# Patient Record
Sex: Female | Born: 1960
Health system: Southern US, Community
[De-identification: ages and names within clinical notes are randomized; demographics above are authoritative.]

## PROBLEM LIST (undated history)

## (undated) DIAGNOSIS — R011 Cardiac murmur, unspecified: Secondary | ICD-10-CM

## (undated) DIAGNOSIS — M1990 Unspecified osteoarthritis, unspecified site: Secondary | ICD-10-CM

## (undated) DIAGNOSIS — E559 Vitamin D deficiency, unspecified: Secondary | ICD-10-CM

## (undated) DIAGNOSIS — F329 Major depressive disorder, single episode, unspecified: Secondary | ICD-10-CM

## (undated) DIAGNOSIS — G43009 Migraine without aura, not intractable, without status migrainosus: Secondary | ICD-10-CM

## (undated) DIAGNOSIS — R32 Unspecified urinary incontinence: Secondary | ICD-10-CM

## (undated) DIAGNOSIS — E119 Type 2 diabetes mellitus without complications: Secondary | ICD-10-CM

## (undated) DIAGNOSIS — G8929 Other chronic pain: Secondary | ICD-10-CM

## (undated) DIAGNOSIS — I10 Essential (primary) hypertension: Secondary | ICD-10-CM

## (undated) DIAGNOSIS — I2510 Atherosclerotic heart disease of native coronary artery without angina pectoris: Secondary | ICD-10-CM

## (undated) DIAGNOSIS — M79606 Pain in leg, unspecified: Secondary | ICD-10-CM

## (undated) DIAGNOSIS — F32A Depression, unspecified: Secondary | ICD-10-CM

## (undated) HISTORY — DX: Morbid (severe) obesity due to excess calories: E66.01

## (undated) HISTORY — DX: Essential (primary) hypertension: I10

## (undated) HISTORY — DX: Depression, unspecified: F32.A

## (undated) HISTORY — DX: Unspecified urinary incontinence: R32

## (undated) HISTORY — PX: TOTAL VAGINAL HYSTERECTOMY: SHX2548

## (undated) HISTORY — DX: Vitamin D deficiency, unspecified: E55.9

## (undated) HISTORY — DX: Type 2 diabetes mellitus without complications: E11.9

## (undated) HISTORY — DX: Pain in leg, unspecified: M79.606

## (undated) HISTORY — DX: Migraine without aura, not intractable, without status migrainosus: G43.009

## (undated) HISTORY — DX: Unspecified osteoarthritis, unspecified site: M19.90

## (undated) HISTORY — DX: Cardiac murmur, unspecified: R01.1

## (undated) HISTORY — DX: Major depressive disorder, single episode, unspecified: F32.9

## (undated) HISTORY — DX: Other chronic pain: G89.29

## (undated) HISTORY — PX: TUBAL LIGATION: SHX77

---

## 1982-03-23 HISTORY — PX: TONSILLECTOMY: SUR1361

## 1997-07-16 ENCOUNTER — Encounter: Admission: RE | Admit: 1997-07-16 | Discharge: 1997-07-16 | Payer: Self-pay | Admitting: Family Medicine

## 1997-08-17 ENCOUNTER — Encounter: Admission: RE | Admit: 1997-08-17 | Discharge: 1997-08-17 | Payer: Self-pay | Admitting: Family Medicine

## 1997-10-03 ENCOUNTER — Encounter: Admission: RE | Admit: 1997-10-03 | Discharge: 1997-10-03 | Payer: Self-pay | Admitting: Family Medicine

## 1997-10-03 ENCOUNTER — Other Ambulatory Visit: Admission: RE | Admit: 1997-10-03 | Discharge: 1997-10-03 | Payer: Self-pay | Admitting: *Deleted

## 1998-01-22 ENCOUNTER — Encounter: Admission: RE | Admit: 1998-01-22 | Discharge: 1998-01-22 | Payer: Self-pay | Admitting: Sports Medicine

## 1998-03-17 ENCOUNTER — Ambulatory Visit: Admission: RE | Admit: 1998-03-17 | Discharge: 1998-03-17 | Payer: Self-pay | Admitting: Emergency Medicine

## 1998-03-21 ENCOUNTER — Encounter: Admission: RE | Admit: 1998-03-21 | Discharge: 1998-03-21 | Payer: Self-pay | Admitting: Family Medicine

## 1998-05-27 ENCOUNTER — Encounter: Admission: RE | Admit: 1998-05-27 | Discharge: 1998-05-27 | Payer: Self-pay | Admitting: Family Medicine

## 1998-06-24 ENCOUNTER — Encounter: Admission: RE | Admit: 1998-06-24 | Discharge: 1998-06-24 | Payer: Self-pay | Admitting: Family Medicine

## 1998-09-11 ENCOUNTER — Encounter: Admission: RE | Admit: 1998-09-11 | Discharge: 1998-09-11 | Payer: Self-pay | Admitting: Family Medicine

## 1998-09-12 ENCOUNTER — Encounter: Payer: Self-pay | Admitting: Emergency Medicine

## 1998-09-12 ENCOUNTER — Emergency Department (HOSPITAL_COMMUNITY): Admission: EM | Admit: 1998-09-12 | Discharge: 1998-09-12 | Payer: Self-pay | Admitting: Emergency Medicine

## 1998-09-27 ENCOUNTER — Ambulatory Visit (HOSPITAL_COMMUNITY): Admission: RE | Admit: 1998-09-27 | Discharge: 1998-09-27 | Payer: Self-pay | Admitting: Emergency Medicine

## 1998-12-18 ENCOUNTER — Encounter: Admission: RE | Admit: 1998-12-18 | Discharge: 1998-12-18 | Payer: Self-pay | Admitting: Family Medicine

## 2002-03-23 HISTORY — PX: KNEE ARTHROSCOPY W/ ACL RECONSTRUCTION: SHX1858

## 2003-03-24 HISTORY — PX: LAPAROSCOPIC CHOLECYSTECTOMY: SUR755

## 2005-03-23 HISTORY — PX: LAPAROSCOPIC GASTRIC BANDING: SHX1100

## 2007-07-09 ENCOUNTER — Emergency Department (HOSPITAL_COMMUNITY): Admission: EM | Admit: 2007-07-09 | Discharge: 2007-07-09 | Payer: Self-pay | Admitting: Emergency Medicine

## 2007-10-27 ENCOUNTER — Emergency Department (HOSPITAL_COMMUNITY): Admission: EM | Admit: 2007-10-27 | Discharge: 2007-10-27 | Payer: Self-pay | Admitting: Emergency Medicine

## 2008-01-16 ENCOUNTER — Ambulatory Visit (HOSPITAL_COMMUNITY): Admission: RE | Admit: 2008-01-16 | Discharge: 2008-01-16 | Payer: Self-pay | Admitting: Internal Medicine

## 2008-01-30 ENCOUNTER — Encounter: Admission: RE | Admit: 2008-01-30 | Discharge: 2008-01-30 | Payer: Self-pay | Admitting: Internal Medicine

## 2008-05-20 ENCOUNTER — Emergency Department (HOSPITAL_COMMUNITY): Admission: EM | Admit: 2008-05-20 | Discharge: 2008-05-20 | Payer: Self-pay | Admitting: Emergency Medicine

## 2008-08-18 ENCOUNTER — Emergency Department (HOSPITAL_COMMUNITY): Admission: EM | Admit: 2008-08-18 | Discharge: 2008-08-18 | Payer: Self-pay | Admitting: Emergency Medicine

## 2009-03-03 ENCOUNTER — Emergency Department (HOSPITAL_COMMUNITY): Admission: EM | Admit: 2009-03-03 | Discharge: 2009-03-03 | Payer: Self-pay | Admitting: Family Medicine

## 2009-06-10 ENCOUNTER — Ambulatory Visit (HOSPITAL_COMMUNITY): Admission: RE | Admit: 2009-06-10 | Discharge: 2009-06-10 | Payer: Self-pay | Admitting: Internal Medicine

## 2010-01-18 ENCOUNTER — Emergency Department (HOSPITAL_COMMUNITY)
Admission: EM | Admit: 2010-01-18 | Discharge: 2010-01-18 | Payer: Self-pay | Source: Home / Self Care | Admitting: Family Medicine

## 2010-01-18 ENCOUNTER — Inpatient Hospital Stay (HOSPITAL_COMMUNITY): Admission: EM | Admit: 2010-01-18 | Discharge: 2010-01-22 | Payer: Self-pay | Admitting: Emergency Medicine

## 2010-04-13 ENCOUNTER — Encounter: Payer: Self-pay | Admitting: Internal Medicine

## 2010-04-20 ENCOUNTER — Emergency Department (HOSPITAL_COMMUNITY)
Admission: EM | Admit: 2010-04-20 | Discharge: 2010-04-20 | Payer: Self-pay | Source: Home / Self Care | Admitting: Emergency Medicine

## 2010-06-04 LAB — COMPREHENSIVE METABOLIC PANEL
AST: 19 U/L (ref 0–37)
Albumin: 4 g/dL (ref 3.5–5.2)
Alkaline Phosphatase: 57 U/L (ref 39–117)
BUN: 16 mg/dL (ref 6–23)
CO2: 27 mEq/L (ref 19–32)
GFR calc Af Amer: >60 mL/min (ref >60)
GFR calc non Af Amer: >60 mL/min (ref >60)
Glucose, Bld: 87 mg/dL (ref 70–99)
Sodium: 139 mEq/L (ref 135–145)
Total Bilirubin: 0.4 mg/dL (ref 0.3–1.2)
Total Protein: 8.2 g/dL (ref 6.0–8.3)

## 2010-06-04 LAB — DIFFERENTIAL
Basophils Relative: 0 % (ref 0–1)
Eosinophils Absolute: 0 10*3/uL (ref 0.0–0.7)
Eosinophils Relative: 1 % (ref 0–5)
Lymphocytes Relative: 29 % (ref 12–46)
Lymphs Abs: 1.4 10*3/uL (ref 0.7–4.0)
Monocytes Absolute: 0.4 10*3/uL (ref 0.1–1.0)
Neutro Abs: 3 10*3/uL (ref 1.7–7.7)

## 2010-06-04 LAB — POCT URINALYSIS DIPSTICK
Bilirubin Urine: NEGATIVE
Ketones, ur: NEGATIVE mg/dL
Protein, ur: 30 mg/dL — AB
Specific Gravity, Urine: 1.015 (ref 1.005–1.030)

## 2010-06-04 LAB — CBC
MCHC: 31.6 g/dL (ref 30.0–36.0)
Platelets: 282 10*3/uL (ref 150–400)
RDW: 14.3 % (ref 11.5–15.5)

## 2010-06-04 LAB — LIPASE, BLOOD: Lipase: 21 U/L (ref 11–59)

## 2010-06-16 ENCOUNTER — Other Ambulatory Visit (HOSPITAL_COMMUNITY): Payer: Self-pay | Admitting: Internal Medicine

## 2010-06-16 DIAGNOSIS — Z1231 Encounter for screening mammogram for malignant neoplasm of breast: Secondary | ICD-10-CM

## 2010-06-24 ENCOUNTER — Ambulatory Visit (HOSPITAL_COMMUNITY)
Admission: RE | Admit: 2010-06-24 | Discharge: 2010-06-24 | Disposition: A | Discharge status: Home / Self Care | Payer: Self-pay | Source: Ambulatory Visit | Attending: Internal Medicine | Admitting: Internal Medicine

## 2010-06-24 DIAGNOSIS — Z1231 Encounter for screening mammogram for malignant neoplasm of breast: Secondary | ICD-10-CM | POA: Insufficient documentation

## 2010-08-19 ENCOUNTER — Ambulatory Visit (AMBULATORY_SURGERY_CENTER): Payer: 59 | Admitting: *Deleted

## 2010-08-19 VITALS — Ht 65.5 in | Wt 197.0 lb

## 2010-08-19 DIAGNOSIS — Z1211 Encounter for screening for malignant neoplasm of colon: Secondary | ICD-10-CM

## 2010-08-19 MED ORDER — PEG-KCL-NACL-NASULF-NA ASC-C 100 G PO SOLR: ORAL | Status: DC

## 2010-08-19 NOTE — Progress Notes (Signed)
Samantha Molina had a colon about 10 years ago at [facility].  She states it was normal.  I have given the Medical release form to [name] CMA.

## 2010-08-21 ENCOUNTER — Telehealth: Payer: Self-pay

## 2010-08-21 NOTE — Telephone Encounter (Signed)
Message copied by Donata Duff on Thu Aug 21, 2010  4:36 PM ------      Message from: Sarina Ill [facility]      Created: Tue Aug 19, 2010 11:50 AM      Regarding: Medical release form       Ms. Vear Clock says she had a colon at [facility] about 10 years ago and that it was normal.  I gave the Medical release form to [name] CMA.

## 2010-08-21 NOTE — Telephone Encounter (Signed)
Yes, she can stay on schedule.  If she has not had a colonoscopy for 10 years, then screening exam is indicated now.

## 2010-08-21 NOTE — Telephone Encounter (Signed)
Dr Christella Hartigan I have not gotten a response about the previous procedure.  I have refaxed the release with no response.  She is a direct for 08/27/10 do you want to leave her on the schedule?

## 2010-08-22 NOTE — Telephone Encounter (Signed)
Pt will remain on schedule and pt aware to keep appt

## 2010-08-27 ENCOUNTER — Ambulatory Visit (AMBULATORY_SURGERY_CENTER): Payer: 59 | Admitting: Gastroenterology

## 2010-08-27 ENCOUNTER — Encounter: Payer: Self-pay | Admitting: Gastroenterology

## 2010-08-27 VITALS — BP 125/75 | HR 63 | Temp 97.5°F | Resp 16 | Ht 66.0 in | Wt 197.0 lb

## 2010-08-27 DIAGNOSIS — Z1211 Encounter for screening for malignant neoplasm of colon: Secondary | ICD-10-CM

## 2010-08-27 DIAGNOSIS — K5730 Diverticulosis of large intestine without perforation or abscess without bleeding: Secondary | ICD-10-CM

## 2010-08-27 MED ORDER — SODIUM CHLORIDE 0.9 % IV SOLN: 500.0000 mL | INTRAVENOUS | Status: DC

## 2010-08-27 NOTE — Patient Instructions (Signed)
PT. GIVEN DISCHARGE INSTRUCTIONS ( SEE GREEN & BLUE SHEETS ).INFORMATION ON DIVERTICULOSIS & HIGH FIBER DIET GIVEN. SAFETY PRECAUTIONS FOR TODAY DUE TO  SEDATION MEDICATIONS GIVEN AND DIET RECOMMENDED FOR TODAY DISCUSSED.

## 2010-08-28 ENCOUNTER — Telehealth: Payer: Self-pay | Admitting: *Deleted

## 2010-08-28 NOTE — Telephone Encounter (Signed)

## 2010-10-23 ENCOUNTER — Ambulatory Visit (INDEPENDENT_AMBULATORY_CARE_PROVIDER_SITE_OTHER): Payer: 59

## 2010-10-23 ENCOUNTER — Inpatient Hospital Stay (INDEPENDENT_AMBULATORY_CARE_PROVIDER_SITE_OTHER)
Admission: RE | Admit: 2010-10-23 | Discharge: 2010-10-23 | Disposition: A | Discharge status: Home / Self Care | Payer: 59 | Source: Ambulatory Visit | Attending: Family Medicine | Admitting: Family Medicine

## 2010-10-23 DIAGNOSIS — M722 Plantar fascial fibromatosis: Secondary | ICD-10-CM

## 2010-10-23 DIAGNOSIS — M7660 Achilles tendinitis, unspecified leg: Secondary | ICD-10-CM

## 2010-12-19 LAB — POCT I-STAT, CHEM 8
Calcium, Ion: 1.26
HCT: 40
Sodium: 137
TCO2: 28

## 2010-12-19 LAB — CBC
HCT: 38.2
MCHC: 32.6
MCV: 76.9 — ABNORMAL LOW
RBC: 4.97
RDW: 14.2
WBC: 4.1

## 2010-12-19 LAB — POCT CARDIAC MARKERS
CKMB, poc: 1.9
Myoglobin, poc: 82.9

## 2010-12-19 LAB — DIFFERENTIAL: Basophils Absolute: 0

## 2010-12-19 LAB — D-DIMER, QUANTITATIVE: D-Dimer, Quant: <0.22

## 2011-03-24 DIAGNOSIS — E559 Vitamin D deficiency, unspecified: Secondary | ICD-10-CM

## 2011-03-24 HISTORY — DX: Vitamin D deficiency, unspecified: E55.9

## 2011-06-24 ENCOUNTER — Other Ambulatory Visit (HOSPITAL_COMMUNITY): Payer: Self-pay | Admitting: Internal Medicine

## 2011-06-24 DIAGNOSIS — Z1231 Encounter for screening mammogram for malignant neoplasm of breast: Secondary | ICD-10-CM

## 2011-07-23 ENCOUNTER — Ambulatory Visit (HOSPITAL_COMMUNITY)
Admission: RE | Admit: 2011-07-23 | Discharge: 2011-07-23 | Disposition: A | Discharge status: Home / Self Care | Payer: 59 | Source: Ambulatory Visit | Attending: Internal Medicine | Admitting: Internal Medicine

## 2011-07-23 DIAGNOSIS — Z1231 Encounter for screening mammogram for malignant neoplasm of breast: Secondary | ICD-10-CM | POA: Insufficient documentation

## 2012-07-18 ENCOUNTER — Other Ambulatory Visit: Payer: Self-pay | Admitting: Nurse Practitioner

## 2012-07-18 DIAGNOSIS — Z1231 Encounter for screening mammogram for malignant neoplasm of breast: Secondary | ICD-10-CM

## 2012-07-25 ENCOUNTER — Ambulatory Visit (HOSPITAL_COMMUNITY)
Admission: RE | Admit: 2012-07-25 | Discharge: 2012-07-25 | Disposition: A | Discharge status: Home / Self Care | Payer: 59 | Source: Ambulatory Visit | Attending: Nurse Practitioner | Admitting: Nurse Practitioner

## 2012-07-25 DIAGNOSIS — Z1231 Encounter for screening mammogram for malignant neoplasm of breast: Secondary | ICD-10-CM | POA: Insufficient documentation

## 2012-09-08 ENCOUNTER — Ambulatory Visit: Payer: Self-pay | Admitting: Nurse Practitioner

## 2012-09-13 ENCOUNTER — Encounter: Payer: Self-pay | Admitting: Nurse Practitioner

## 2012-09-13 ENCOUNTER — Ambulatory Visit (INDEPENDENT_AMBULATORY_CARE_PROVIDER_SITE_OTHER): Payer: 59 | Admitting: Nurse Practitioner

## 2012-09-13 VITALS — BP 126/70 | HR 78 | Resp 14 | Ht 63.75 in | Wt 196.4 lb

## 2012-09-13 DIAGNOSIS — Z01419 Encounter for gynecological examination (general) (routine) without abnormal findings: Secondary | ICD-10-CM

## 2012-09-13 NOTE — Progress Notes (Signed)
52 y.o. G3P2 Widowed African American Fe here for annual exam.  Got married in 7/ 2013. Will be going on a trip to celebrate 1st anniversary.  Husband is now diagnosed with renal failure and is seeing specialist at [facility]   No LMP recorded. Patient has had a hysterectomy.          Sexually active: no  The current method of family planning is status post hysterectomy.    Exercising: yes  zumba, pilates, kickboxing spin and weights.  Smoker:  no  Health Maintenance: Pap:  07/24/2009  negative MMG:  07/25/2012 normal Colonoscopy:  08/27/2010 diverticula recheck in 5 years BMD:   never TDaP:  2009 Labs: PCP maintains lab (blood) work and urine.    reports that she has never smoked. She has never used smokeless tobacco. She reports that she drinks about 0.5 ounces of alcohol per week. She reports that she does not use illicit drugs.  Past Medical History  Diagnosis Date  . Hypertension   . Arthritis   . Depression     Past Surgical History  Procedure Laterality Date  . Laparoscopic cholecystectomy    . Knee arthroscopy w/ acl reconstruction    . Laparoscopic gastric banding      Current Outpatient Prescriptions  Medication Sig Dispense Refill  . Cholecalciferol (VITAMIN D) 2000 UNITS tablet Take 2,000 Units by mouth daily.        Marland Kitchen desvenlafaxine (PRISTIQ) 50 MG 24 hr tablet Take 50 mg by mouth daily.        Marland Kitchen lisinopril-hydrochlorothiazide (PRINZIDE,ZESTORETIC) 20-25 MG per tablet Take 1 tablet by mouth daily.        . Multiple Vitamins-Minerals (MULTIVITAMIN WITH MINERALS) tablet Take 1 tablet by mouth daily.        Marland Kitchen zolpidem (AMBIEN) 10 MG tablet Take 10 mg by mouth at bedtime as needed.         Current Facility-Administered Medications  Medication Dose Route Frequency Provider Last Rate Last Dose  . 0.9 %  sodium chloride infusion  500 mL Intravenous Continuous Rachael Fee, MD        History reviewed. No pertinent family history.  ROS:  Pertinent items are noted in  HPI.  Otherwise, a comprehensive ROS was negative.  Exam:   BP 126/70  Pulse 78  Resp 14  Ht 5' 3.75" (1.619 m)  Wt 196 lb 6.4 oz (89.086 kg)  BMI 33.99 kg/m2 Height: 5' 3.75" (161.9 cm)  Ht Readings from Last 3 Encounters:  09/13/12 5' 3.75" (1.619 m)  08/27/10 5\' 6"  (1.676 m)  08/19/10 5' 5.5" (1.664 m)    General appearance: alert, cooperative and appears stated age Head: Normocephalic, without obvious abnormality, atraumatic Neck: no adenopathy, supple, symmetrical, trachea midline and thyroid normal to inspection and palpation Lungs: clear to auscultation bilaterally Breasts: normal appearance, no masses or tenderness Heart: regular rate and rhythm Abdomen: soft, non-tender; no masses,  no organomegaly Extremities: extremities normal, atraumatic, no cyanosis or edema Skin: Skin color, texture, turgor normal. No rashes or lesions Lymph nodes: Cervical, supraclavicular, and axillary nodes normal. No abnormal inguinal nodes palpated Neurologic: Grossly normal   Pelvic: External genitalia:  no lesions              Urethra:  normal appearing urethra with no masses, tenderness or lesions              Bartholin's and Skene's: normal  Vagina: normal appearing vagina with normal color and discharge, no lesions              Cervix: absent              Pap taken: no Bimanual Exam:  Uterus:  uterus absent              Adnexa: no mass, fullness, tenderness               Rectovaginal: Confirms               Anus:  normal sphincter tone, no lesions  A:  Well Woman with normal exam  S/P TVH age 73 secondary to menorrhagia  [facility] - has BRCA Info  P:   Pap smear as per guidelines   Mammogram due 5 /2015  Counseled on breast self exam, adequate intake of calcium and vitamin D,   diet and exercise, Kegel's exercises return annually or prn  An After Visit Summary was printed and given to the patient.

## 2012-09-13 NOTE — Patient Instructions (Signed)

## 2012-09-14 NOTE — Progress Notes (Signed)
Encounter reviewed by Dr. Chadric Kimberley Silva.  

## 2012-10-12 ENCOUNTER — Encounter: Payer: 59 | Attending: Family Medicine | Admitting: *Deleted

## 2012-10-12 ENCOUNTER — Encounter: Payer: Self-pay | Admitting: *Deleted

## 2012-10-12 DIAGNOSIS — Z713 Dietary counseling and surveillance: Secondary | ICD-10-CM | POA: Insufficient documentation

## 2012-10-12 NOTE — Progress Notes (Signed)
  Follow-up visit:  7+ Years Post-Operative LAGB Surgery  Medical Nutrition Therapy:  Appt start time: 1130   End time: 1230.  Primary concerns today: Post-operative Bariatric Surgery Nutrition Refresher Works 6:30p - 2:30a; sleeps from 1:30p-4:30p. Snacks on 1/4 c cashews or almonds, protein shakes, tuna or chicken salad (made with [employer]).   Surgery date: 07/2005 Patient reported pre-op weight: 289 lbs Lowest patient reported weight:  132 lbs/size 4 (2008)  Weight today: 198.0 lbs Weight change: + 68 lbs (from lowest weight) Total weight lost:  91 lbs  Goal weight: 170-175 lbs % goal met:  76-80%  TANITA  BODY COMP RESULTS  10/12/12   BMI (kg/m^2) 34.0   Fat Mass (lbs) 83.0   Fat Free Mass (lbs) 115.0   Total Body Water (lbs) 84.0   24-hr recall: Reports she resumed this eating plan in January 2014. States she had been eating whatever she wanted, though has had no bread in the last year.  B (AM): 8 oz Body by Vi or Go Lean shake = 20-25g Snk (10:30 AM): Protein bar (Power Crunch/Pure Protein) = 15-20g L (12:30 PM): Spinach salad with 2 oz grilled chicken, tomatoes, cucumbers, and avocado = 15g Snk (PM): None - sleeping D (PM): Protein shake or 2 oz lean protein/vegetables = 15-25g Snk (PM): None  Fluid intake:  Reports > 64 oz (Minute Maid "Just 15 cal", protein shakes) Estimated total protein intake: 65-85g  Medications:  Reconciled with pt during visit.  Supplementation: Taking MVI regularly. No calcium citrate reported.   Using straws: No Drinking while eating: No Hair loss: No Carbonated beverages: No N/V/D/C: None Last Lap-Band fill:  Dec 2013 - Dr. Delories Heinz [facility] [place])  Recent physical activity:  Very active in Zumba, Kickboxing, Pilates, and weight lifting  Progress Towards Goal(s):  In progress.  Handouts given during visit include:  Specialized Bariatric Surgery Post-Op Diet  Samples given during visit include:   Celebrate  Vitamins Iron + C 18 mg (2) - Lot: [id number]; Exp: 03/15   Iron 30 mg (4) - Lot: [id number]; Exp: 01/16  Orange MVI (3) - Lot: [id number]; Exp: 07/15 Mandarin Orange MVI (3) - Lot: [id number]; Exp: 07/15 Grape MVI (3) - Lot: [id number]; Exp: 01/15 Calcium (Orange Burst - 3) - Lot: [id number]; Exp: 03/16   Bariatric Advantage Vitamins MVI (3 ea) Lot: [id number]; Exp: 06/15 Lot: [id number]; Exp: 06/15 Lot: [id number]; Exp: 10/15    Calcium Citrate (3 ea) Lot: [id number]; Exp: 03/15 Lot: [id number]; Exp: 10/15  Nutritional Diagnosis:  Levan-3.4 Unintentional weight gain related to poor food choices s/p LAGB surgery as evidenced by patient with ~ 70 lb wt gain over last 5 years.    Intervention:  Nutrition education/reinforcement.  Monitoring/Evaluation:  Dietary intake, exercise, lap band fills, and body weight. Follow up in 1 month or prn.

## 2012-10-12 NOTE — Patient Instructions (Addendum)
Goals:  Eat 3-6 small meals/snacks, every 3-5 hrs  Increase lean protein foods to meet 60-80g goal  Increase fluid intake to 64oz +  Aim for a max of 15 grams of carbohydrate (fruit, whole grain, starchy vegetable) with meals  Avoid drinking 15 minutes before, during and 30 minutes after eating  Aim for >30 min of physical activity daily  Track intake in My Fitness Pal (or similar) and make me a friend

## 2012-11-09 ENCOUNTER — Ambulatory Visit: Payer: 59 | Admitting: *Deleted

## 2013-01-09 ENCOUNTER — Emergency Department (HOSPITAL_COMMUNITY)
Admission: EM | Admit: 2013-01-09 | Discharge: 2013-01-09 | Disposition: A | Discharge status: Home / Self Care | Payer: 59 | Source: Home / Self Care | Attending: Family Medicine | Admitting: Family Medicine

## 2013-01-09 ENCOUNTER — Encounter (HOSPITAL_COMMUNITY): Payer: Self-pay | Admitting: Emergency Medicine

## 2013-01-09 DIAGNOSIS — J209 Acute bronchitis, unspecified: Secondary | ICD-10-CM

## 2013-01-09 DIAGNOSIS — J208 Acute bronchitis due to other specified organisms: Secondary | ICD-10-CM

## 2013-01-09 MED ORDER — IPRATROPIUM BROMIDE 0.03 % NA SOLN: 2.0000 | Freq: Two times a day (BID) | NASAL | Status: DC

## 2013-01-09 MED ORDER — AZITHROMYCIN 250 MG PO TABS: 250.0000 mg | ORAL_TABLET | Freq: Every day | ORAL | Status: DC

## 2013-01-09 MED ORDER — PREDNISONE 50 MG PO TABS: 50.0000 mg | ORAL_TABLET | Freq: Every day | ORAL | Status: DC

## 2013-01-09 MED ORDER — GUAIFENESIN-CODEINE 100-10 MG/5ML PO SOLN: 5.0000 mL | Freq: Three times a day (TID) | ORAL | Status: DC | PRN

## 2013-01-09 NOTE — ED Notes (Signed)
C/o productive cough with yellow/green sputum. Body aches. Nausea and sneezing since Thursday night. Denies fever and diarrhea.   States recently had flu vaccine. Pt has used Thera flu with no relief in symptoms.

## 2013-01-09 NOTE — ED Provider Notes (Addendum)
Samantha Molina is a 52 y.o. female who presents to Urgent Care today for productive cough associated with sneezing and body aches over the past 4 days. No trouble breathing or chest pain. She's tried some over-the-counter medications which have not helped much. No fevers chills nausea vomiting or diarrhea. She is well otherwise.   Past Medical History  Diagnosis Date  . Hypertension   . Arthritis   . Depression   . Vitamin D deficiency disease 2013  . Morbid obesity   . Diabetes mellitus without complication    History  Substance Use Topics  . Smoking status: Never Smoker   . Smokeless tobacco: Never Used  . Alcohol Use: 0.5 oz/week    1 drink(s) per week     Comment: rarely   ROS as above Medications reviewed. No current facility-administered medications for this encounter.   Current Outpatient Prescriptions  Medication Sig Dispense Refill  . desvenlafaxine (PRISTIQ) 50 MG 24 hr tablet Take 50 mg by mouth daily.        Marland Kitchen lisinopril-hydrochlorothiazide (PRINZIDE,ZESTORETIC) 20-25 MG per tablet Take 1 tablet by mouth daily.        Marland Kitchen azithromycin (ZITHROMAX) 250 MG tablet Take 1 tablet (250 mg total) by mouth daily. Take first 2 tablets together, then 1 every day until finished.  6 tablet  0  . Cholecalciferol (VITAMIN D) 2000 UNITS tablet Take 2,000 Units by mouth daily.        Marland Kitchen guaiFENesin-codeine 100-10 MG/5ML syrup Take 5 mLs by mouth 3 (three) times daily as needed for cough.  120 mL  0  . ipratropium (ATROVENT) 0.03 % nasal spray Place 2 sprays into the nose every 12 (twelve) hours.  30 mL  1  . Multiple Vitamins-Minerals (MULTIVITAMIN WITH MINERALS) tablet Take 1 tablet by mouth daily.        Marland Kitchen zolpidem (AMBIEN) 10 MG tablet Take 10 mg by mouth at bedtime as needed.          Exam:  BP 161/93  Pulse 66  Temp(Src) 98.6 F (37 C) (Oral)  Resp 14  SpO2 100% Gen: Well NAD HEENT: EOMI,  MMM, posterior pharynx with cobblestoning. Tympanic membranes are normal appearing  bilateral Lungs: CTABL Nl WOB Heart: RRR no MRG Abd: NABS, NT, ND Exts: Non edematous BL  LE, warm and well perfused.   No results found for this or any previous visit (from the past 24 hour(s)). No results found.  Assessment and Plan: 52 y.o. female with bronchitis likely viral.. This is also associated with a postnasal drip. Very low probability for pneumonia given normal oxygen sat heart rate and temperature as well as normal lung exam. Plan to treat with codeine containing cough medication, 5 days prednisone course, Atrovent nasal spray, and Tylenol or ibuprofen. Additionally we'll use azithromycin if not getting better in a few days. Discussed warning signs or symptoms. Please see discharge instructions. Patient expresses understanding.      Rodolph Bong, MD 01/09/13 1145  Rodolph Bong, MD 01/09/13 [id number]

## 2013-06-22 ENCOUNTER — Encounter: Payer: Self-pay | Admitting: Nurse Practitioner

## 2013-07-26 ENCOUNTER — Other Ambulatory Visit (HOSPITAL_COMMUNITY): Payer: Self-pay | Admitting: Family Medicine

## 2013-07-26 DIAGNOSIS — Z1231 Encounter for screening mammogram for malignant neoplasm of breast: Secondary | ICD-10-CM

## 2013-07-31 ENCOUNTER — Ambulatory Visit (HOSPITAL_COMMUNITY)
Admission: RE | Admit: 2013-07-31 | Discharge: 2013-07-31 | Disposition: A | Discharge status: Home / Self Care | Payer: 59 | Source: Ambulatory Visit | Attending: Family Medicine | Admitting: Family Medicine

## 2013-07-31 DIAGNOSIS — Z1231 Encounter for screening mammogram for malignant neoplasm of breast: Secondary | ICD-10-CM | POA: Insufficient documentation

## 2013-09-15 ENCOUNTER — Ambulatory Visit: Payer: 59 | Admitting: Nurse Practitioner

## 2013-09-21 ENCOUNTER — Encounter (INDEPENDENT_AMBULATORY_CARE_PROVIDER_SITE_OTHER): Payer: Self-pay | Admitting: Surgery

## 2013-09-21 ENCOUNTER — Encounter: Payer: Self-pay | Admitting: Nurse Practitioner

## 2013-09-21 ENCOUNTER — Ambulatory Visit: Payer: 59 | Admitting: Nurse Practitioner

## 2013-09-21 ENCOUNTER — Ambulatory Visit (INDEPENDENT_AMBULATORY_CARE_PROVIDER_SITE_OTHER): Payer: Commercial Managed Care - PPO | Admitting: Surgery

## 2013-09-21 DIAGNOSIS — Z9884 Bariatric surgery status: Secondary | ICD-10-CM | POA: Insufficient documentation

## 2013-09-21 NOTE — Patient Instructions (Signed)
Sleeve Gastrectomy A sleeve gastrectomy is a surgery in which a large portion of the stomach is removed. After the surgery, the stomach will be a narrow tube about the size of a banana. This surgery is performed to help a person lose weight. The person loses weight because the reduced size of the stomach restricts the amount of food that the person can eat. The stomach will hold much less food than before the surgery. Also, the part of the stomach that is removed produces a hormone that causes hunger.  This surgery is done for people who have morbid obesity, defined as a body mass index (BMI) greater than 40. BMI is an estimate of body fat and is calculated from the height and weight of a person. This surgery may also be done for people with a BMI between 35 and 40 if they have other diseases, such as type 2 diabetes mellitus, obstructive sleep apnea, or heart and lung disorders (cardiopulmonary diseases).  LET YOUR HEALTH CARE PROVIDER KNOW ABOUT:  Any allergies you have.   All medicines you are taking, including vitamins, herbs, eyedrops, creams, and over-the-counter medicines.   Use of steroids (by mouth or creams).   Previous problems you or members of your family have had with the use of anesthetics.   Any blood disorders you have.   Previous surgeries you have had.   Possibility of pregnancy, if this applies.   Other health problems you have. RISKS AND COMPLICATIONS Generally, sleeve gastrectomy is a safe procedure. However, as with any procedure, complications can occur. Possible complications include:  Infection.  Bleeding.  Blood clots.  Damage to other organs or tissue.  Leakage of fluid from the stomach into the abdominal cavity (rare). BEFORE THE PROCEDURE  You may need to have blood tests and imaging tests (such as X-rays or ultrasonography) done before the day of surgery. A test to evaluate your esophagus and how it moves (esophageal manometry) may also be  done.  You may be placed on a liquid diet 2-3 weeks before the surgery.  Ask your health care provider about changing or stopping your regular medicines.  Do not eat or drink anything for at least 8 hours before the procedure.   Make plans to have someone drive you home after your hospital stay. Also arrange for someone to help you with activities during recovery. PROCEDURE  A laparoscopic technique is usually used for this surgery:  You will be given medicine to make you sleep through the procedure (general anesthetic). This medicine will be given through an intravenous (IV) access tube that is put into one of your veins.  Once you are asleep, your abdomen will be cleaned and sterilized.  Several small incisions will be made in your abdomen.  Your abdomen will be filled with air so that it expands. This gives the surgeon more room to operate and makes your organs easier to see.  A thin, lighted tube with a tiny camera on the end (laparoscope) is put through a small incision in your abdomen. The camera on the laparoscope sends a picture to a TV screen in the operating room. This gives the surgeon a good view inside the abdomen.  Hollow tubes are put through the other small incisions in your abdomen. The tools needed for the procedure are put through these tubes.  The surgeon uses staples to divide part of the stomach and then removes it through one of the incisions.  The remaining stomach may be reinforced using stitches   or surgical glue or both to prevent leakage of the stomach contents. A small tube (drain) may be placed through one of the incisions to allow extra fluid to flow from the area.  The incisions are closed with stitches, staples, or glue. AFTER THE PROCEDURE  You will be monitored closely in a recovery area. Once the anesthetic has worn off, you will likely be moved to a regular hospital room.  You will be given medicine for pain and nausea.   You may have a drain  from one of the incisions in your abdomen. If a drain is used, it may stay in place after you go home from the hospital and be removed at a follow-up appointment.   You will be encouraged to walk around several times a day. This helps prevent blood clots.  You will be started on a liquid diet the first day after your surgery. Sometimes a test is done to check for leaking before you can eat.  You will be urged to cough and do deep breathing exercises. This helps prevent a lung infection after a surgery.  You will likely need to stay in the hospital for a few days.  Document Released: 01/04/2009 Document Revised: 11/09/2012 Document Reviewed: 07/22/2012 ExitCare Patient Information 2015 [employer]. This information is not intended to replace advice given to you by your health care provider. Make sure you discuss any questions you have with your health care provider.  

## 2013-09-21 NOTE — Addendum Note (Signed)
Addended by: Brennan BaileyBROOKS, Amelio Brosky on: 09/21/2013 12:26 PM   Modules accepted: Orders

## 2013-09-21 NOTE — Progress Notes (Signed)
Chief Complaint:  Morbid obesity BMI 35 with lap band in place  History of Present Illness:  Samantha Molina is an 53 y.o. female who underwent laparoscopic adjustable gastric banding by Dr. Delories Heinzobert Richard in [place] in 2007. She apparently had an APL band placed. Prior to surgery she began her turning around 300 pounds and lost down around to 60 before her lap band placement. She was seen in the hospital by [facility] he took the fluid out of her band for some issues with obstruction. She is attended a seminar and is in to rest in having her band removed and a gastric sleeve  performed. She has been to a seminar and wants to proceed with removal and sleeve which I stated may require two operations.    [facility] dist[facility] distal to her band and had 9 cc of fluid removed and never replaced.  UGI, CT scan all showed the band to be in good position and no complications.   She works over on [employer] at [employer].    Past Medical History  Diagnosis Date  . Hypertension   . Arthritis   . Depression   . Vitamin D deficiency disease 2013  . Morbid obesity   . Diabetes mellitus without complication     Past Surgical History  Procedure Laterality Date  . Laparoscopic cholecystectomy  2005  . Knee arthroscopy w/ acl reconstruction Left 2004  . Laparoscopic gastric banding  2007  . Total vaginal hysterectomy  age 53    Ovaries remain, for menorrhagia  . Tonsillectomy  1984    Age 53    Current Outpatient Prescriptions  Medication Sig Dispense Refill  . ALPRAZolam (XANAX) 0.25 MG tablet Take 0.25 mg by mouth at bedtime as needed for anxiety.      . Cholecalciferol (VITAMIN D) 2000 UNITS tablet Take 2,000 Units by mouth daily.        Marland Kitchen. desvenlafaxine (PRISTIQ) 50 MG 24 hr tablet Take 50 mg by mouth daily.        Marland Kitchen. lisinopril-hydrochlorothiazide (PRINZIDE,ZESTORETIC) 20-25 MG per tablet Take 1 tablet by mouth daily.        . Multiple Vitamins-Minerals  (MULTIVITAMIN WITH MINERALS) tablet Take 1 tablet by mouth daily.        . naproxen sodium (ANAPROX) 550 MG tablet Take 550 mg by mouth 2 (two) times daily with a meal.      . Omega-3 Fatty Acids (FISH OIL) 1000 MG CAPS Take by mouth.      . zolpidem (AMBIEN) 10 MG tablet Take 10 mg by mouth at bedtime as needed.         No current facility-administered medications for this visit.   Review of patient's allergies indicates no known allergies. Family History  Problem Relation Age of Onset  . Hypertension Mother   . Rheum arthritis Mother     Lupus  . Diabetes Father   . Hypertension Father   . Stroke Father   . Hyperlipidemia Father   . Diabetes Brother   . Hyperlipidemia Brother   . Breast cancer Maternal Aunt 64    X 2, one in her 5250's  . Breast cancer Cousin [id number]    maternal & paternal cousin  . Breast cancer Paternal Aunt 30    died in her 1540's   Social History:   reports that she has never smoked. She has never used smokeless tobacco. She reports that she drinks about .  5 ounces of alcohol per week. She reports that she does not use illicit drugs.   REVIEW OF SYSTEMS : Positive for prior lap chole ; otherwise negative  Physical Exam:   Blood pressure 126/80, pulse 90, temperature 97.5 F (36.4 C), height 5\' 6"  (1.676 m), weight 218 lb (98.884 kg). Body mass index is 35.2 kg/(m^2).  Gen:  WDWN AAF NAD  Neurological: Alert and oriented to person, place, and time. Motor and sensory function is grossly intact  Head: Normocephalic and atraumatic.  Eyes: Conjunctivae are normal. Pupils are equal, round, and reactive to light. No scleral icterus.  Neck: Normal range of motion. Neck supple. No tracheal deviation or thyromegaly present.  Cardiovascular:  SR without murmurs or gallops.  No carotid bruits Breast:  Not examined Respiratory: Effort normal.  No respiratory distress. No chest wall tenderness. Breath sounds normal.  No wheezes, rales or rhonchi.  Abdomen:  Moderately  obest GU:  Not examined Musculoskeletal: Normal range of motion. Extremities are nontender. No cyanosis, edema or clubbing noted Lymphadenopathy: No cervical, preauricular, postauricular or axillary adenopathy is present Skin: Skin is warm and dry. No rash noted. No diaphoresis. No erythema. No pallor. Pscyh: Normal mood and affect. Behavior is normal. Judgment and thought content normal.   LABORATORY RESULTS: No results found for this or any previous visit (from the past 48 hour(s)).   RADIOLOGY RESULTS: No results found.  Problem List: There are no active problems to display for this patient.   Assessment & Plan: Lapband and persistant obesity.  Plan removal of lapband and sleeve gastrectomy    Matt B. Daphine DeutscherMartin, MD, [facility]  [facility] [phone] beeper [phone]  09/21/2013 12:09 PM

## 2013-10-23 ENCOUNTER — Encounter (INDEPENDENT_AMBULATORY_CARE_PROVIDER_SITE_OTHER): Payer: Self-pay

## 2013-10-23 ENCOUNTER — Other Ambulatory Visit: Payer: Self-pay

## 2013-10-23 ENCOUNTER — Ambulatory Visit (HOSPITAL_COMMUNITY)
Admission: RE | Admit: 2013-10-23 | Discharge: 2013-10-23 | Disposition: A | Discharge status: Home / Self Care | Payer: 59 | Source: Ambulatory Visit | Attending: Surgery | Admitting: Surgery

## 2013-10-23 DIAGNOSIS — F3289 Other specified depressive episodes: Secondary | ICD-10-CM | POA: Insufficient documentation

## 2013-10-23 DIAGNOSIS — E119 Type 2 diabetes mellitus without complications: Secondary | ICD-10-CM | POA: Insufficient documentation

## 2013-10-23 DIAGNOSIS — Z9884 Bariatric surgery status: Secondary | ICD-10-CM | POA: Insufficient documentation

## 2013-10-23 DIAGNOSIS — F329 Major depressive disorder, single episode, unspecified: Secondary | ICD-10-CM | POA: Insufficient documentation

## 2013-10-23 DIAGNOSIS — I10 Essential (primary) hypertension: Secondary | ICD-10-CM | POA: Insufficient documentation

## 2013-10-23 DIAGNOSIS — Z6835 Body mass index (BMI) 35.0-35.9, adult: Secondary | ICD-10-CM | POA: Insufficient documentation

## 2013-10-23 DIAGNOSIS — E559 Vitamin D deficiency, unspecified: Secondary | ICD-10-CM | POA: Insufficient documentation

## 2013-10-23 DIAGNOSIS — M129 Arthropathy, unspecified: Secondary | ICD-10-CM | POA: Insufficient documentation

## 2013-11-01 ENCOUNTER — Ambulatory Visit: Payer: 59 | Admitting: Dietician

## 2013-11-07 ENCOUNTER — Ambulatory Visit: Payer: 59 | Admitting: Nurse Practitioner

## 2014-01-05 ENCOUNTER — Other Ambulatory Visit: Payer: Self-pay

## 2014-01-22 ENCOUNTER — Encounter (INDEPENDENT_AMBULATORY_CARE_PROVIDER_SITE_OTHER): Payer: Self-pay | Admitting: Surgery

## 2014-02-28 ENCOUNTER — Encounter: Payer: Self-pay | Admitting: Nurse Practitioner

## 2014-07-09 ENCOUNTER — Other Ambulatory Visit (HOSPITAL_COMMUNITY): Payer: Self-pay | Admitting: Family Medicine

## 2014-07-09 DIAGNOSIS — Z1231 Encounter for screening mammogram for malignant neoplasm of breast: Secondary | ICD-10-CM

## 2014-08-06 ENCOUNTER — Ambulatory Visit (HOSPITAL_COMMUNITY)
Admission: RE | Admit: 2014-08-06 | Discharge: 2014-08-06 | Disposition: A | Discharge status: Home / Self Care | Payer: 59 | Source: Ambulatory Visit | Attending: Family Medicine | Admitting: Family Medicine

## 2014-08-06 DIAGNOSIS — Z1231 Encounter for screening mammogram for malignant neoplasm of breast: Secondary | ICD-10-CM | POA: Insufficient documentation

## 2014-08-08 ENCOUNTER — Other Ambulatory Visit: Payer: Self-pay | Admitting: Family Medicine

## 2014-08-08 DIAGNOSIS — R928 Other abnormal and inconclusive findings on diagnostic imaging of breast: Secondary | ICD-10-CM

## 2014-08-13 ENCOUNTER — Ambulatory Visit
Admission: RE | Admit: 2014-08-13 | Discharge: 2014-08-13 | Disposition: A | Discharge status: Home / Self Care | Payer: 59 | Source: Ambulatory Visit | Attending: Family Medicine | Admitting: Family Medicine

## 2014-08-13 DIAGNOSIS — R928 Other abnormal and inconclusive findings on diagnostic imaging of breast: Secondary | ICD-10-CM

## 2015-03-28 MED FILL — TOPIRAMATE 100 MG TABLET: 100 | 30 days supply | Qty: 60 | Fill #4

## 2015-03-28 MED FILL — PRISTIQ ER 50 MG TABLET: 50 | 30 days supply | Qty: 30 | Fill #1

## 2015-03-28 MED FILL — ALPRAZolam 0.25 MG TABS: 0.25 | 30 days supply | Qty: 30 | Fill #1

## 2015-04-25 DIAGNOSIS — G43009 Migraine without aura, not intractable, without status migrainosus: Secondary | ICD-10-CM | POA: Diagnosis not present

## 2015-04-25 DIAGNOSIS — M5416 Radiculopathy, lumbar region: Secondary | ICD-10-CM | POA: Diagnosis not present

## 2015-04-25 DIAGNOSIS — G629 Polyneuropathy, unspecified: Secondary | ICD-10-CM | POA: Diagnosis not present

## 2015-04-25 DIAGNOSIS — M5412 Radiculopathy, cervical region: Secondary | ICD-10-CM | POA: Diagnosis not present

## 2015-04-25 DIAGNOSIS — G5601 Carpal tunnel syndrome, right upper limb: Secondary | ICD-10-CM | POA: Diagnosis not present

## 2015-04-29 MED FILL — TOPIRAMATE 100 MG TABLET: 100 | 30 days supply | Qty: 60 | Fill #0

## 2015-04-29 MED FILL — ALPRAZolam 0.25 MG TABS: 0.25 | 30 days supply | Qty: 30 | Fill #2

## 2015-04-29 MED FILL — AMLODIPINE BESYLATE 10 MG T: 10 | 90 days supply | Qty: 90 | Fill #0

## 2015-04-29 MED FILL — PRISTIQ ER 50 MG TABLET: 50 | 30 days supply | Qty: 30 | Fill #2

## 2015-04-29 MED FILL — ZOLPIDEM TARTRATE 10 MG TAB: 10 | 90 days supply | Qty: 90 | Fill #0

## 2015-05-06 DIAGNOSIS — I10 Essential (primary) hypertension: Secondary | ICD-10-CM | POA: Diagnosis not present

## 2015-05-06 DIAGNOSIS — B349 Viral infection, unspecified: Secondary | ICD-10-CM | POA: Diagnosis not present

## 2015-05-06 DIAGNOSIS — R51 Headache: Secondary | ICD-10-CM | POA: Diagnosis not present

## 2015-05-06 MED FILL — OSELTAMIVIR PHOS 75 MG CAP: 75 | 5 days supply | Qty: 10 | Fill #0

## 2015-05-17 MED FILL — OXYCODONE-APAP 10-325 TAB: 10-325 | 30 days supply | Qty: 90 | Fill #0

## 2015-05-24 MED FILL — LISINOPRIL-HCTZ 20-25 MG TA: 20-25 | 90 days supply | Qty: 90 | Fill #1

## 2015-05-31 MED FILL — ALPRAZolam 0.25 MG TABS: 0.25 | 30 days supply | Qty: 30 | Fill #3

## 2015-05-31 MED FILL — DESVENLAFAXINE ER 50 MG TAB: 50 | 30 days supply | Qty: 30 | Fill #3

## 2015-06-19 DIAGNOSIS — J101 Influenza due to other identified influenza virus with other respiratory manifestations: Secondary | ICD-10-CM | POA: Diagnosis not present

## 2015-06-19 MED FILL — HYDROCODONE-HOMATROPINE SYR: 5-1.5 | 6 days supply | Qty: 120 | Fill #0

## 2015-06-19 MED FILL — OSELTAMIVIR PHOS 75 MG CAP: 75 | 5 days supply | Qty: 10 | Fill #0

## 2015-06-20 MED FILL — TOPIRAMATE 100 MG TABLET: 100 | 30 days supply | Qty: 60 | Fill #1

## 2015-07-02 MED FILL — DESVENLAFAXINE ER 50 MG TAB: 50 | 30 days supply | Qty: 30 | Fill #4

## 2015-07-02 MED FILL — ALPRAZolam 0.25 MG TABS: 0.25 | 30 days supply | Qty: 30 | Fill #4

## 2015-07-25 MED FILL — TOPIRAMATE 100 MG TABLET: 100 | 30 days supply | Qty: 60 | Fill #2

## 2015-07-25 MED FILL — AMLODIPINE BESYLATE 10 MG T: 10 | 90 days supply | Qty: 90 | Fill #1

## 2015-07-29 DIAGNOSIS — H5213 Myopia, bilateral: Secondary | ICD-10-CM | POA: Diagnosis not present

## 2015-08-01 MED FILL — CHLORHEXIDINE 0.12% RINSE: 0.12 | 16 days supply | Qty: 473 | Fill #0

## 2015-08-05 MED FILL — ALPRAZolam 0.25 MG TABS: 0.25 | 30 days supply | Qty: 30 | Fill #5

## 2015-08-05 MED FILL — DESVENLAFAXINE ER 50 MG TAB: 50 | 30 days supply | Qty: 30 | Fill #5

## 2015-08-07 ENCOUNTER — Other Ambulatory Visit: Payer: Self-pay

## 2015-08-07 DIAGNOSIS — Z1231 Encounter for screening mammogram for malignant neoplasm of breast: Secondary | ICD-10-CM

## 2015-08-15 DIAGNOSIS — R200 Anesthesia of skin: Secondary | ICD-10-CM | POA: Diagnosis not present

## 2015-08-15 DIAGNOSIS — G629 Polyneuropathy, unspecified: Secondary | ICD-10-CM | POA: Diagnosis not present

## 2015-08-15 DIAGNOSIS — G5603 Carpal tunnel syndrome, bilateral upper limbs: Secondary | ICD-10-CM | POA: Diagnosis not present

## 2015-08-15 DIAGNOSIS — R531 Weakness: Secondary | ICD-10-CM | POA: Diagnosis not present

## 2015-08-15 DIAGNOSIS — M5416 Radiculopathy, lumbar region: Secondary | ICD-10-CM | POA: Diagnosis not present

## 2015-08-15 DIAGNOSIS — M5412 Radiculopathy, cervical region: Secondary | ICD-10-CM | POA: Diagnosis not present

## 2015-08-15 DIAGNOSIS — G43009 Migraine without aura, not intractable, without status migrainosus: Secondary | ICD-10-CM | POA: Diagnosis not present

## 2015-08-29 ENCOUNTER — Other Ambulatory Visit: Payer: Self-pay | Admitting: Family Medicine

## 2015-08-29 ENCOUNTER — Ambulatory Visit
Admission: RE | Admit: 2015-08-29 | Discharge: 2015-08-29 | Disposition: A | Discharge status: Home / Self Care | Payer: 59 | Source: Ambulatory Visit

## 2015-08-29 DIAGNOSIS — Z1231 Encounter for screening mammogram for malignant neoplasm of breast: Secondary | ICD-10-CM

## 2015-09-04 DIAGNOSIS — F320 Major depressive disorder, single episode, mild: Secondary | ICD-10-CM | POA: Diagnosis not present

## 2015-09-04 DIAGNOSIS — E119 Type 2 diabetes mellitus without complications: Secondary | ICD-10-CM | POA: Diagnosis not present

## 2015-09-04 DIAGNOSIS — I10 Essential (primary) hypertension: Secondary | ICD-10-CM | POA: Diagnosis not present

## 2015-09-04 DIAGNOSIS — G4700 Insomnia, unspecified: Secondary | ICD-10-CM | POA: Diagnosis not present

## 2015-09-04 MED FILL — LISINOPRIL-HCTZ 20-25 MG TA: 20-25 | 90 days supply | Qty: 90 | Fill #0

## 2015-09-04 MED FILL — ALPRAZolam 0.25 MG TABS: 0.25 | 30 days supply | Qty: 30 | Fill #0

## 2015-09-05 MED FILL — TOPIRAMATE 100 MG TABLET: 100 | 30 days supply | Qty: 60 | Fill #3

## 2015-09-05 MED FILL — DESVENLAFAXINE ER 50 MG TAB: 50 | 90 days supply | Qty: 90 | Fill #0

## 2015-09-09 MED FILL — CHLORHEXIDINE 0.12% RINSE: 0.12 | 16 days supply | Qty: 473 | Fill #1

## 2015-09-12 MED FILL — OXYCODONE-ACETAMINOPHEN 10-: 10-325 | 30 days supply | Qty: 90 | Fill #0

## 2015-09-20 ENCOUNTER — Emergency Department (HOSPITAL_COMMUNITY)
Admission: EM | Admit: 2015-09-20 | Discharge: 2015-09-20 | Disposition: A | Discharge status: Home / Self Care | Payer: 59 | Attending: Emergency Medicine | Admitting: Emergency Medicine

## 2015-09-20 ENCOUNTER — Encounter (HOSPITAL_COMMUNITY): Payer: Self-pay | Admitting: *Deleted

## 2015-09-20 DIAGNOSIS — Z79899 Other long term (current) drug therapy: Secondary | ICD-10-CM | POA: Diagnosis not present

## 2015-09-20 DIAGNOSIS — R55 Syncope and collapse: Secondary | ICD-10-CM | POA: Diagnosis not present

## 2015-09-20 DIAGNOSIS — I10 Essential (primary) hypertension: Secondary | ICD-10-CM | POA: Diagnosis not present

## 2015-09-20 DIAGNOSIS — E119 Type 2 diabetes mellitus without complications: Secondary | ICD-10-CM | POA: Diagnosis not present

## 2015-09-20 LAB — BASIC METABOLIC PANEL
ANION GAP: 9 (ref 5–15)
BUN: 22 mg/dL — ABNORMAL HIGH (ref 6–20)
CALCIUM: 9.4 mg/dL (ref 8.9–10.3)
CO2: 25 mmol/L (ref 22–32)
Chloride: 104 mmol/L (ref 101–111)
Creatinine, Ser: 1.09 mg/dL — ABNORMAL HIGH (ref 0.44–1.00)
GFR calc non Af Amer: 56 mL/min — ABNORMAL LOW (ref >60)
Glucose, Bld: 98 mg/dL (ref 65–99)
Potassium: 2.9 mmol/L — ABNORMAL LOW (ref 3.5–5.1)
Sodium: 138 mmol/L (ref 135–145)

## 2015-09-20 LAB — CBC
HCT: 39.2 % (ref 36.0–46.0)
HEMOGLOBIN: 12.3 g/dL (ref 12.0–15.0)
MCH: 23.6 pg — AB (ref 26.0–34.0)
MCHC: 31.4 g/dL (ref 30.0–36.0)
MCV: 75.2 fL — ABNORMAL LOW (ref 78.0–100.0)
Platelets: 321 10*3/uL (ref 150–400)
RBC: 5.21 MIL/uL — AB (ref 3.87–5.11)
RDW: 15 % (ref 11.5–15.5)
WBC: 5.3 10*3/uL (ref 4.0–10.5)

## 2015-09-20 LAB — URINALYSIS, ROUTINE W REFLEX MICROSCOPIC
Bilirubin Urine: NEGATIVE
Glucose, UA: NEGATIVE mg/dL
HGB URINE DIPSTICK: NEGATIVE
Ketones, ur: 15 mg/dL — AB
NITRITE: NEGATIVE
Protein, ur: NEGATIVE mg/dL
SPECIFIC GRAVITY, URINE: 1.013 (ref 1.005–1.030)
pH: 5.5 (ref 5.0–8.0)

## 2015-09-20 LAB — URINE MICROSCOPIC-ADD ON

## 2015-09-20 LAB — CBG MONITORING, ED: GLUCOSE-CAPILLARY: 82 mg/dL (ref 65–99)

## 2015-09-20 MED ORDER — POTASSIUM CHLORIDE CRYS ER 20 MEQ PO TBCR
40.0000 meq | EXTENDED_RELEASE_TABLET | Freq: Once | ORAL | Status: AC
  Administered 2015-09-20: 40 meq via ORAL
  Filled 2015-09-20: qty 2

## 2015-09-20 MED ORDER — SODIUM CHLORIDE 0.9 % IV BOLUS (SEPSIS)
1000.0000 mL | Freq: Once | INTRAVENOUS | Status: AC
  Administered 2015-09-20: 1000 mL via INTRAVENOUS

## 2015-09-20 NOTE — ED Notes (Signed)
CBG 82 

## 2015-09-20 NOTE — ED Notes (Signed)
Pt had a syncopal episode after getting overheated at a gunrange (it was hotter in the gun range than outdoors)  Pt began feeling dizzy and nauseated and fainted, she was passed out for about a minute.  No fall, she was assisted to the ground.  No CP or sob with this.  Pt had a normal 12-lead and her CBG was 110.  Pt was orthostatic for ems and got NS en route.  22g IV is in left hand.
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
## 2015-09-20 NOTE — Discharge Instructions (Signed)

## 2015-10-04 DIAGNOSIS — E876 Hypokalemia: Secondary | ICD-10-CM | POA: Diagnosis not present

## 2015-10-04 DIAGNOSIS — I10 Essential (primary) hypertension: Secondary | ICD-10-CM | POA: Diagnosis not present

## 2015-10-04 DIAGNOSIS — R55 Syncope and collapse: Secondary | ICD-10-CM | POA: Diagnosis not present

## 2015-10-11 MED FILL — ALPRAZolam 0.25 MG TABS: 0.25 | 30 days supply | Qty: 30 | Fill #1

## 2015-10-31 MED FILL — TOPIRAMATE 100 MG TABLET: 100 | 30 days supply | Qty: 60 | Fill #4

## 2015-10-31 MED FILL — AMLODIPINE BESYLATE 10 MG T: 10 | 90 days supply | Qty: 90 | Fill #0

## 2015-11-13 MED FILL — ALPRAZolam 0.25 MG TABS: 0.25 | 30 days supply | Qty: 30 | Fill #2

## 2015-11-13 MED FILL — CHLORHEXIDINE 0.12% RINSE: 0.12 | 16 days supply | Qty: 473 | Fill #2

## 2015-12-03 MED FILL — LISINOPRIL-HCTZ 20-25 MG TA: 20-25 | 90 days supply | Qty: 90 | Fill #1

## 2015-12-03 MED FILL — OXYCODONE-APAP 10-325 TAB: 10-325 | 30 days supply | Qty: 90 | Fill #0

## 2015-12-12 DIAGNOSIS — G629 Polyneuropathy, unspecified: Secondary | ICD-10-CM | POA: Diagnosis not present

## 2015-12-12 DIAGNOSIS — M5416 Radiculopathy, lumbar region: Secondary | ICD-10-CM | POA: Diagnosis not present

## 2015-12-12 DIAGNOSIS — M5412 Radiculopathy, cervical region: Secondary | ICD-10-CM | POA: Diagnosis not present

## 2015-12-12 DIAGNOSIS — G5603 Carpal tunnel syndrome, bilateral upper limbs: Secondary | ICD-10-CM | POA: Diagnosis not present

## 2015-12-12 DIAGNOSIS — G43009 Migraine without aura, not intractable, without status migrainosus: Secondary | ICD-10-CM | POA: Diagnosis not present

## 2015-12-12 MED FILL — DESVENLAFAXINE ER 50 MG TAB: 50 | 90 days supply | Qty: 90 | Fill #1

## 2015-12-12 MED FILL — TOPIRAMATE 100 MG TABLET: 100 | 30 days supply | Qty: 60 | Fill #0

## 2015-12-19 MED FILL — ALPRAZolam 0.25 MG TABS: 0.25 | 30 days supply | Qty: 30 | Fill #3

## 2016-01-02 MED FILL — OXYCODONE-APAP 10-325 TAB: 10-325 | 30 days supply | Qty: 90 | Fill #0

## 2016-01-20 MED FILL — ALPRAZolam 0.25 MG TABS: 0.25 | 30 days supply | Qty: 30 | Fill #4

## 2016-01-23 DIAGNOSIS — G5603 Carpal tunnel syndrome, bilateral upper limbs: Secondary | ICD-10-CM | POA: Diagnosis not present

## 2016-01-23 DIAGNOSIS — M542 Cervicalgia: Secondary | ICD-10-CM | POA: Diagnosis not present

## 2016-01-23 DIAGNOSIS — G43009 Migraine without aura, not intractable, without status migrainosus: Secondary | ICD-10-CM | POA: Diagnosis not present

## 2016-01-23 DIAGNOSIS — M5416 Radiculopathy, lumbar region: Secondary | ICD-10-CM | POA: Diagnosis not present

## 2016-01-23 DIAGNOSIS — G629 Polyneuropathy, unspecified: Secondary | ICD-10-CM | POA: Diagnosis not present

## 2016-01-23 MED FILL — GABAPENTIN 300 MG CAPSULE: 300 | 30 days supply | Qty: 90 | Fill #0

## 2016-01-27 MED FILL — TOPIRAMATE 100 MG TABLET: 100 | 30 days supply | Qty: 60 | Fill #1

## 2016-01-28 ENCOUNTER — Encounter (HOSPITAL_COMMUNITY): Payer: Self-pay | Admitting: *Deleted

## 2016-01-28 ENCOUNTER — Emergency Department (HOSPITAL_COMMUNITY)
Admission: EM | Admit: 2016-01-28 | Discharge: 2016-01-28 | Disposition: A | Discharge status: Home / Self Care | Payer: 59 | Attending: Emergency Medicine | Admitting: Emergency Medicine

## 2016-01-28 DIAGNOSIS — S199XXA Unspecified injury of neck, initial encounter: Secondary | ICD-10-CM | POA: Insufficient documentation

## 2016-01-28 DIAGNOSIS — Z79899 Other long term (current) drug therapy: Secondary | ICD-10-CM | POA: Diagnosis not present

## 2016-01-28 DIAGNOSIS — R519 Headache, unspecified: Secondary | ICD-10-CM

## 2016-01-28 DIAGNOSIS — I10 Essential (primary) hypertension: Secondary | ICD-10-CM | POA: Insufficient documentation

## 2016-01-28 DIAGNOSIS — Y999 Unspecified external cause status: Secondary | ICD-10-CM | POA: Diagnosis not present

## 2016-01-28 DIAGNOSIS — Y939 Activity, unspecified: Secondary | ICD-10-CM | POA: Diagnosis not present

## 2016-01-28 DIAGNOSIS — M542 Cervicalgia: Secondary | ICD-10-CM

## 2016-01-28 DIAGNOSIS — R51 Headache: Secondary | ICD-10-CM | POA: Insufficient documentation

## 2016-01-28 DIAGNOSIS — Y92481 Parking lot as the place of occurrence of the external cause: Secondary | ICD-10-CM | POA: Diagnosis not present

## 2016-01-28 DIAGNOSIS — E119 Type 2 diabetes mellitus without complications: Secondary | ICD-10-CM | POA: Diagnosis not present

## 2016-01-28 MED ORDER — IBUPROFEN 400 MG PO TABS: 400.0000 mg | ORAL_TABLET | Freq: Three times a day (TID) | ORAL | 0 refills | Status: DC | PRN

## 2016-01-28 MED ORDER — IBUPROFEN 400 MG PO TABS
600.0000 mg | ORAL_TABLET | Freq: Once | ORAL | Status: AC
  Administered 2016-01-28: 600 mg via ORAL
  Filled 2016-01-28: qty 1

## 2016-01-28 MED ORDER — CYCLOBENZAPRINE HCL 10 MG PO TABS: 10.0000 mg | ORAL_TABLET | Freq: Three times a day (TID) | ORAL | 0 refills | Status: DC | PRN

## 2016-01-28 MED FILL — CYCLOBENZAPRINE 10 MG TAB: 10 | 4 days supply | Qty: 12 | Fill #0

## 2016-01-28 MED FILL — IBUPROFEN 400 MG TABLET: 400 | 5 days supply | Qty: 15 | Fill #0
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
## 2016-01-31 MED FILL — OXYCODONE-APAP 10-325 TAB: 10-325 | 30 days supply | Qty: 90 | Fill #0

## 2016-01-31 MED FILL — AMLODIPINE BESYLATE 10 MG T: 10 | 90 days supply | Qty: 90 | Fill #1

## 2016-02-20 MED FILL — ALPRAZolam 0.25 MG TABS: 0.25 | 30 days supply | Qty: 30 | Fill #5

## 2016-02-25 DIAGNOSIS — J209 Acute bronchitis, unspecified: Secondary | ICD-10-CM | POA: Diagnosis not present

## 2016-02-25 DIAGNOSIS — E119 Type 2 diabetes mellitus without complications: Secondary | ICD-10-CM | POA: Diagnosis not present

## 2016-02-25 DIAGNOSIS — G4700 Insomnia, unspecified: Secondary | ICD-10-CM | POA: Diagnosis not present

## 2016-02-25 DIAGNOSIS — I10 Essential (primary) hypertension: Secondary | ICD-10-CM | POA: Diagnosis not present

## 2016-02-25 DIAGNOSIS — F320 Major depressive disorder, single episode, mild: Secondary | ICD-10-CM | POA: Diagnosis not present

## 2016-02-25 MED FILL — LISINOPRIL-HCTZ 20-25 MG TA: 20-25 | 90 days supply | Qty: 90 | Fill #0

## 2016-02-25 MED FILL — AMOXICILLIN 875 MG TABLET: 875 | 10 days supply | Qty: 20 | Fill #0

## 2016-02-25 MED FILL — ZOLPIDEM TARTRATE 10 MG TAB: 10 | 90 days supply | Qty: 90 | Fill #0

## 2016-03-02 MED FILL — TOPIRAMATE 100 MG TABLET: 100 | 30 days supply | Qty: 60 | Fill #2

## 2016-03-05 DIAGNOSIS — M5412 Radiculopathy, cervical region: Secondary | ICD-10-CM | POA: Diagnosis not present

## 2016-03-05 DIAGNOSIS — G43009 Migraine without aura, not intractable, without status migrainosus: Secondary | ICD-10-CM | POA: Diagnosis not present

## 2016-03-05 DIAGNOSIS — M5416 Radiculopathy, lumbar region: Secondary | ICD-10-CM | POA: Diagnosis not present

## 2016-03-05 DIAGNOSIS — G5603 Carpal tunnel syndrome, bilateral upper limbs: Secondary | ICD-10-CM | POA: Diagnosis not present

## 2016-03-05 DIAGNOSIS — G629 Polyneuropathy, unspecified: Secondary | ICD-10-CM | POA: Diagnosis not present

## 2016-03-05 MED FILL — OXYCODONE-APAP 10-325: 10-325 | 30 days supply | Qty: 90 | Fill #0

## 2016-03-10 MED FILL — GABAPENTIN 300 MG CAPSULE: 300 | 30 days supply | Qty: 90 | Fill #1

## 2016-03-25 MED FILL — DESVENLAFAXINE SUC ER 50 MG: 50 | 90 days supply | Qty: 90 | Fill #0

## 2016-03-25 MED FILL — ALPRAZolam 0.25 MG TABS: 0.25 | 30 days supply | Qty: 30 | Fill #0

## 2016-04-06 MED FILL — OXYCODONE-APAP 10-325: 10-325 | 30 days supply | Qty: 90 | Fill #0

## 2016-04-28 DIAGNOSIS — B349 Viral infection, unspecified: Secondary | ICD-10-CM | POA: Diagnosis not present

## 2016-04-28 MED FILL — IPRATROPIUM 0.06% SPRAY: 0.06 | 30 days supply | Qty: 15 | Fill #0

## 2016-04-28 MED FILL — CHERATUSSIN AC SYRUP: 100-10 | 7 days supply | Qty: 70 | Fill #0

## 2016-05-04 MED FILL — TOPIRAMATE 100 MG TABLET: 100 | 30 days supply | Qty: 60 | Fill #3

## 2016-05-04 MED FILL — ALPRAZolam 0.25 MG TABS: 0.25 | 30 days supply | Qty: 30 | Fill #1

## 2016-05-04 MED FILL — AMLODIPINE BESYLATE 10 MG T: 10 | 90 days supply | Qty: 90 | Fill #0

## 2016-05-08 MED FILL — OXYCODONE-APAP 10-325: 10-325 | 30 days supply | Qty: 90 | Fill #0

## 2016-06-12 MED FILL — TOPIRAMATE 100 MG TABLET: 100 | 30 days supply | Qty: 60 | Fill #4

## 2016-06-12 MED FILL — ALPRAZolam 0.25 MG TABS: 0.25 | 30 days supply | Qty: 30 | Fill #2

## 2016-06-15 MED FILL — LISINOPRIL-HCTZ 20-25 MG TA: 20-25 | 90 days supply | Qty: 90 | Fill #1

## 2016-06-16 DIAGNOSIS — G43009 Migraine without aura, not intractable, without status migrainosus: Secondary | ICD-10-CM | POA: Diagnosis not present

## 2016-06-16 DIAGNOSIS — M5416 Radiculopathy, lumbar region: Secondary | ICD-10-CM | POA: Diagnosis not present

## 2016-06-16 DIAGNOSIS — G5603 Carpal tunnel syndrome, bilateral upper limbs: Secondary | ICD-10-CM | POA: Diagnosis not present

## 2016-06-16 DIAGNOSIS — M5412 Radiculopathy, cervical region: Secondary | ICD-10-CM | POA: Diagnosis not present

## 2016-06-16 DIAGNOSIS — G603 Idiopathic progressive neuropathy: Secondary | ICD-10-CM | POA: Diagnosis not present

## 2016-06-18 DIAGNOSIS — H5213 Myopia, bilateral: Secondary | ICD-10-CM | POA: Diagnosis not present

## 2016-06-24 MED FILL — DESVENLAFAXINE SUC ER 50 MG: 50 | 90 days supply | Qty: 90 | Fill #1

## 2016-07-06 ENCOUNTER — Emergency Department (HOSPITAL_COMMUNITY): Payer: 59

## 2016-07-06 ENCOUNTER — Inpatient Hospital Stay (HOSPITAL_COMMUNITY)
Admission: EM | Admit: 2016-07-06 | Discharge: 2016-07-09 | DRG: 872 | Disposition: A | Discharge status: Home / Self Care | Payer: 59 | Attending: Internal Medicine | Admitting: Internal Medicine

## 2016-07-06 ENCOUNTER — Encounter (HOSPITAL_COMMUNITY): Payer: Self-pay | Admitting: Emergency Medicine

## 2016-07-06 DIAGNOSIS — F419 Anxiety disorder, unspecified: Secondary | ICD-10-CM | POA: Diagnosis not present

## 2016-07-06 DIAGNOSIS — Z8249 Family history of ischemic heart disease and other diseases of the circulatory system: Secondary | ICD-10-CM

## 2016-07-06 DIAGNOSIS — Z833 Family history of diabetes mellitus: Secondary | ICD-10-CM

## 2016-07-06 DIAGNOSIS — E119 Type 2 diabetes mellitus without complications: Secondary | ICD-10-CM | POA: Diagnosis present

## 2016-07-06 DIAGNOSIS — Z823 Family history of stroke: Secondary | ICD-10-CM | POA: Diagnosis not present

## 2016-07-06 DIAGNOSIS — E669 Obesity, unspecified: Secondary | ICD-10-CM | POA: Diagnosis present

## 2016-07-06 DIAGNOSIS — A419 Sepsis, unspecified organism: Principal | ICD-10-CM

## 2016-07-06 DIAGNOSIS — F329 Major depressive disorder, single episode, unspecified: Secondary | ICD-10-CM | POA: Diagnosis present

## 2016-07-06 DIAGNOSIS — E876 Hypokalemia: Secondary | ICD-10-CM | POA: Diagnosis not present

## 2016-07-06 DIAGNOSIS — Z9884 Bariatric surgery status: Secondary | ICD-10-CM | POA: Diagnosis not present

## 2016-07-06 DIAGNOSIS — I10 Essential (primary) hypertension: Secondary | ICD-10-CM

## 2016-07-06 DIAGNOSIS — R000 Tachycardia, unspecified: Secondary | ICD-10-CM | POA: Diagnosis not present

## 2016-07-06 DIAGNOSIS — Z9049 Acquired absence of other specified parts of digestive tract: Secondary | ICD-10-CM

## 2016-07-06 DIAGNOSIS — Z6832 Body mass index (BMI) 32.0-32.9, adult: Secondary | ICD-10-CM

## 2016-07-06 DIAGNOSIS — E559 Vitamin D deficiency, unspecified: Secondary | ICD-10-CM | POA: Diagnosis present

## 2016-07-06 DIAGNOSIS — N3091 Cystitis, unspecified with hematuria: Secondary | ICD-10-CM

## 2016-07-06 DIAGNOSIS — Z791 Long term (current) use of non-steroidal anti-inflammatories (NSAID): Secondary | ICD-10-CM

## 2016-07-06 DIAGNOSIS — N10 Acute tubulo-interstitial nephritis: Secondary | ICD-10-CM | POA: Diagnosis not present

## 2016-07-06 DIAGNOSIS — Z90710 Acquired absence of both cervix and uterus: Secondary | ICD-10-CM

## 2016-07-06 DIAGNOSIS — N3090 Cystitis, unspecified without hematuria: Secondary | ICD-10-CM | POA: Diagnosis not present

## 2016-07-06 DIAGNOSIS — R509 Fever, unspecified: Secondary | ICD-10-CM | POA: Diagnosis not present

## 2016-07-06 LAB — COMPREHENSIVE METABOLIC PANEL
ALBUMIN: 3.9 g/dL (ref 3.5–5.0)
ALT: 17 U/L (ref 14–54)
AST: 20 U/L (ref 15–41)
Alkaline Phosphatase: 53 U/L (ref 38–126)
Anion gap: 10 (ref 5–15)
BUN: 17 mg/dL (ref 6–20)
CALCIUM: 9.7 mg/dL (ref 8.9–10.3)
CO2: 25 mmol/L (ref 22–32)
Chloride: 107 mmol/L (ref 101–111)
Creatinine, Ser: 1.11 mg/dL — ABNORMAL HIGH (ref 0.44–1.00)
GFR calc Af Amer: >60 mL/min (ref >60)
GFR, EST NON AFRICAN AMERICAN: 54 mL/min — AB (ref >60)
GLUCOSE: 133 mg/dL — AB (ref 65–99)
POTASSIUM: 3 mmol/L — AB (ref 3.5–5.1)
Sodium: 142 mmol/L (ref 135–145)
Total Bilirubin: 0.8 mg/dL (ref 0.3–1.2)
Total Protein: 8.2 g/dL — ABNORMAL HIGH (ref 6.5–8.1)

## 2016-07-06 LAB — CBC WITH DIFFERENTIAL/PLATELET
BASOS ABS: 0 10*3/uL (ref 0.0–0.1)
BASOS PCT: 0 %
EOS ABS: 0 10*3/uL (ref 0.0–0.7)
Eosinophils Relative: 0 %
HEMATOCRIT: 41.6 % (ref 36.0–46.0)
HEMOGLOBIN: 13.3 g/dL (ref 12.0–15.0)
LYMPHS PCT: 9 %
Lymphs Abs: 1.7 10*3/uL (ref 0.7–4.0)
MCH: 23.8 pg — AB (ref 26.0–34.0)
MCHC: 32 g/dL (ref 30.0–36.0)
MCV: 74.6 fL — ABNORMAL LOW (ref 78.0–100.0)
MONOS PCT: 8 %
Monocytes Absolute: 1.5 10*3/uL — ABNORMAL HIGH (ref 0.1–1.0)
NEUTROS ABS: 15.9 10*3/uL — AB (ref 1.7–7.7)
NEUTROS PCT: 83 %
Platelets: 294 10*3/uL (ref 150–400)
RBC: 5.58 MIL/uL — ABNORMAL HIGH (ref 3.87–5.11)
RDW: 14.8 % (ref 11.5–15.5)
WBC: 19.1 10*3/uL — ABNORMAL HIGH (ref 4.0–10.5)

## 2016-07-06 LAB — URINALYSIS, ROUTINE W REFLEX MICROSCOPIC
Bilirubin Urine: NEGATIVE
GLUCOSE, UA: NEGATIVE mg/dL
Ketones, ur: 5 mg/dL — AB
NITRITE: NEGATIVE
PROTEIN: 30 mg/dL — AB
SPECIFIC GRAVITY, URINE: 1.015 (ref 1.005–1.030)
pH: 6 (ref 5.0–8.0)

## 2016-07-06 LAB — I-STAT CG4 LACTIC ACID, ED: LACTIC ACID, VENOUS: 1.36 mmol/L (ref 0.5–1.9)

## 2016-07-06 MED ORDER — POTASSIUM CHLORIDE 20 MEQ/15ML (10%) PO SOLN
40.0000 meq | Freq: Once | ORAL | Status: AC
  Administered 2016-07-06: 40 meq via ORAL
  Filled 2016-07-06: qty 30

## 2016-07-06 MED ORDER — VENLAFAXINE HCL ER 37.5 MG PO CP24
37.5000 mg | ORAL_CAPSULE | Freq: Every day | ORAL | Status: DC
  Administered 2016-07-07 – 2016-07-09 (×3): 37.5 mg via ORAL
  Filled 2016-07-06 (×3): qty 1

## 2016-07-06 MED ORDER — ONDANSETRON HCL 4 MG/2ML IJ SOLN: 4.0000 mg | Freq: Four times a day (QID) | INTRAMUSCULAR | Status: DC | PRN

## 2016-07-06 MED ORDER — POTASSIUM CHLORIDE CRYS ER 20 MEQ PO TBCR
20.0000 meq | EXTENDED_RELEASE_TABLET | Freq: Once | ORAL | Status: AC
  Administered 2016-07-06: 20 meq via ORAL
  Filled 2016-07-06: qty 1

## 2016-07-06 MED ORDER — ENOXAPARIN SODIUM 40 MG/0.4ML ~~LOC~~ SOLN
40.0000 mg | SUBCUTANEOUS | Status: DC
  Administered 2016-07-06 – 2016-07-08 (×3): 40 mg via SUBCUTANEOUS
  Filled 2016-07-06 (×3): qty 0.4

## 2016-07-06 MED ORDER — ACETAMINOPHEN 325 MG PO TABS
650.0000 mg | ORAL_TABLET | Freq: Four times a day (QID) | ORAL | Status: DC | PRN
  Administered 2016-07-06 – 2016-07-07 (×3): 650 mg via ORAL
  Filled 2016-07-06 (×4): qty 2

## 2016-07-06 MED ORDER — SODIUM CHLORIDE 0.9 % IV BOLUS (SEPSIS)
1000.0000 mL | Freq: Once | INTRAVENOUS | Status: AC
  Administered 2016-07-06: 1000 mL via INTRAVENOUS

## 2016-07-06 MED ORDER — ONDANSETRON HCL 4 MG PO TABS
4.0000 mg | ORAL_TABLET | Freq: Four times a day (QID) | ORAL | Status: DC | PRN
  Administered 2016-07-07: 4 mg via ORAL
  Filled 2016-07-06: qty 1

## 2016-07-06 MED ORDER — SODIUM CHLORIDE 0.9 % IV SOLN
INTRAVENOUS | Status: DC
  Administered 2016-07-06 – 2016-07-07 (×2): via INTRAVENOUS

## 2016-07-06 MED ORDER — ACETAMINOPHEN 650 MG RE SUPP: 650.0000 mg | Freq: Four times a day (QID) | RECTAL | Status: DC | PRN

## 2016-07-06 MED ORDER — TOPIRAMATE 100 MG PO TABS
100.0000 mg | ORAL_TABLET | Freq: Two times a day (BID) | ORAL | Status: DC
  Administered 2016-07-06 – 2016-07-09 (×6): 100 mg via ORAL
  Filled 2016-07-06 (×6): qty 1

## 2016-07-06 MED ORDER — ALPRAZOLAM 0.25 MG PO TABS
0.2500 mg | ORAL_TABLET | Freq: Every evening | ORAL | Status: DC | PRN
  Administered 2016-07-06 – 2016-07-07 (×2): 0.25 mg via ORAL
  Filled 2016-07-06 (×2): qty 1

## 2016-07-06 MED ORDER — OMEGA-3-ACID ETHYL ESTERS 1 G PO CAPS
1.0000 g | ORAL_CAPSULE | Freq: Every day | ORAL | Status: DC
  Administered 2016-07-07 – 2016-07-09 (×3): 1 g via ORAL
  Filled 2016-07-06 (×3): qty 1

## 2016-07-06 MED ORDER — DEXTROSE 5 % IV SOLN
1.0000 g | INTRAVENOUS | Status: DC
  Administered 2016-07-07 – 2016-07-09 (×3): 1 g via INTRAVENOUS
  Filled 2016-07-06 (×3): qty 10

## 2016-07-06 MED ORDER — CEFTRIAXONE SODIUM 2 G IJ SOLR
2.0000 g | Freq: Once | INTRAMUSCULAR | Status: AC
  Administered 2016-07-06: 2 g via INTRAVENOUS
  Filled 2016-07-06: qty 2

## 2016-07-06 MED ORDER — ACETAMINOPHEN 500 MG PO TABS
1000.0000 mg | ORAL_TABLET | Freq: Once | ORAL | Status: AC
  Administered 2016-07-06: 1000 mg via ORAL
  Filled 2016-07-06: qty 2

## 2016-07-06 MED ORDER — ADULT MULTIVITAMIN W/MINERALS CH
1.0000 | ORAL_TABLET | Freq: Every day | ORAL | Status: DC
  Administered 2016-07-07 – 2016-07-09 (×3): 1 via ORAL
  Filled 2016-07-06 (×6): qty 1

## 2016-07-06 MED ORDER — VITAMIN D 1000 UNITS PO TABS
2000.0000 [IU] | ORAL_TABLET | Freq: Every day | ORAL | Status: DC
  Administered 2016-07-07 – 2016-07-09 (×3): 2000 [IU] via ORAL
  Filled 2016-07-06 (×3): qty 2
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
## 2016-07-06 NOTE — H&P (Signed)
History and Physical    Samantha Molina BJY:[id number] DOB: October 28, 1960 DOA: 07/06/2016  PCP: Leanor Rubenstein, MD  Patient coming from: Home   I have personally briefly reviewed patient's old medical records in [facility]  Chief Complaint: fever, supra-pubic pain.   HPI: Samantha Molina is a 56 y.o. female with medical history significant of HTN, Obesity, who presents complaining of fever since the night prior to admission. She also report blood in the urine. She relates supra-pubic pain that started couple of weeks ago, denies dysuria. She is feeling tired, sleepy. She denies chest pain, dyspnea, vaginal bleeding. She denies flank pain, back pain.   ED Course: Patient was found to be febrile, tempeture at 101, UA with too numerous to count WBC, Large hb. Lactic acid at 1.36, K at 3.0, cr 1.1, WBC 19, Chest x ray: Stable chest.  No acute cardiopulmonary disease. Prior lap band surgery. Lap band appears to be in stable position.  Review of Systems: As per HPI otherwise 10 point review of systems negative.    Past Medical History:  Diagnosis Date  . Arthritis   . Depression   . Diabetes mellitus without complication (HCC)   . Hypertension   . Morbid obesity (HCC)   . Vitamin D deficiency disease 2013    Past Surgical History:  Procedure Laterality Date  . KNEE ARTHROSCOPY W/ ACL RECONSTRUCTION Left 2004  . LAPAROSCOPIC CHOLECYSTECTOMY  2005  . LAPAROSCOPIC GASTRIC BANDING  2007  . TONSILLECTOMY  1984   Age 72  . TOTAL VAGINAL HYSTERECTOMY  age 23   Ovaries remain, for menorrhagia     reports that she has never smoked. She has never used smokeless tobacco. She reports that she drinks about 0.5 oz of alcohol per week . She reports that she does not use drugs.  No Known Allergies  Family History  Problem Relation Age of Onset  . Hypertension Mother   . Rheum arthritis Mother     Lupus  . Diabetes Father   . Hypertension Father   . Stroke Father   . Hyperlipidemia  Father   . Diabetes Brother   . Hyperlipidemia Brother   . Breast cancer Maternal Aunt 64    X 2, one in her 58's  . Breast cancer Cousin 90    maternal & paternal cousin  . Breast cancer Paternal Aunt 30    died in her 65's   Unacceptable: Noncontributory, unremarkable, or negative. Acceptable: Family history reviewed and not pertinent (If you reviewed it)  Prior to Admission medications   Medication Sig Start Date End Date Taking? Authorizing Provider  ALPRAZolam Prudy Feeler) 0.25 MG tablet Take 0.25 mg by mouth at bedtime as needed for anxiety.    Historical Provider, MD  Cholecalciferol (VITAMIN D) 2000 UNITS tablet Take 2,000 Units by mouth daily.      Historical Provider, MD  cyclobenzaprine (FLEXERIL) 10 MG tablet Take 1 tablet (10 mg total) by mouth 3 (three) times daily as needed for muscle spasms. 01/28/16   Azalia Bilis, MD  desvenlafaxine (PRISTIQ) 50 MG 24 hr tablet Take 50 mg by mouth daily.      Historical Provider, MD  ibuprofen (ADVIL,MOTRIN) 400 MG tablet Take 1 tablet (400 mg total) by mouth every 8 (eight) hours as needed. 01/28/16   Azalia Bilis, MD  lisinopril-hydrochlorothiazide (PRINZIDE,ZESTORETIC) 20-25 MG per tablet Take 1 tablet by mouth daily.      Historical Provider, MD  Multiple Vitamins-Minerals (MULTIVITAMIN WITH MINERALS) tablet Take  1 tablet by mouth daily.      Historical Provider, MD  naproxen sodium (ANAPROX) 550 MG tablet Take 550 mg by mouth 2 (two) times daily with a meal.    Historical Provider, MD  Omega-3 Fatty Acids (FISH OIL) 1000 MG CAPS Take by mouth.    Historical Provider, MD  zolpidem (AMBIEN) 10 MG tablet Take 10 mg by mouth at bedtime as needed.      Historical Provider, MD    Physical Exam: Vitals:   07/06/16 1332 07/06/16 1400 07/06/16 1430 07/06/16 1523  BP: 130/65 129/68 112/71 112/74  Pulse: (!) 112 (!) 108 (!) 104 (!) 101  Resp: 18 (!) 33 (!) 26 (!) 22  Temp:    (!) 100.9 F (38.3 C)  TempSrc:    Oral  SpO2: 98% 100% 99% 98%    Weight:        Constitutional: NAD, calm, comfortable Vitals:   07/06/16 1332 07/06/16 1400 07/06/16 1430 07/06/16 1523  BP: 130/65 129/68 112/71 112/74  Pulse: (!) 112 (!) 108 (!) 104 (!) 101  Resp: 18 (!) 33 (!) 26 (!) 22  Temp:    (!) 100.9 F (38.3 C)  TempSrc:    Oral  SpO2: 98% 100% 99% 98%  Weight:       Eyes: PERRL, lids and conjunctivae normal ENMT: Mucous membranes are moist. Posterior pharynx clear of any exudate or lesions.Normal dentition.  Neck: normal, supple, no masses, no thyromegaly Respiratory: clear to auscultation bilaterally, no wheezing, no crackles. Normal respiratory effort. No accessory muscle use.  Cardiovascular: Regular rate and rhythm, no murmurs / rubs / gallops. No extremity edema. 2+ pedal pulses. No carotid bruits.  Abdomen: no tenderness, no masses palpated. No hepatosplenomegaly. Bowel sounds positive.  Musculoskeletal: no clubbing / cyanosis. No joint deformity upper and lower extremities. Good ROM, no contractures. Normal muscle tone.  Skin: no rashes, lesions, ulcers. No induration Neurologic: CN 2-12 grossly intact. Sensation intact, DTR normal. Strength 5/5 in all 4.  Psychiatric: Normal judgment and insight. Alert and oriented x 3. Normal mood.   (Anything < 9 systems with 2 bullets each down codes to level 1) (If patient refuses exam can't bill higher level) (Make sure to document decubitus ulcers present on admission -- if possible -- and whether patient has chronic indwelling catheter at time of admission)  Labs on Admission: I have personally reviewed following labs and imaging studies  CBC:  Recent Labs Lab 07/06/16 1219  WBC 19.1*  NEUTROABS 15.9*  HGB 13.3  HCT 41.6  MCV 74.6*  PLT 294   Basic Metabolic Panel:  Recent Labs Lab 07/06/16 1219  NA 142  K 3.0*  CL 107  CO2 25  GLUCOSE 133*  BUN 17  CREATININE 1.11*  CALCIUM 9.7   GFR: Estimated Creatinine Clearance: 64.9 mL/min (A) (by C-G formula based on SCr  of 1.11 mg/dL (H)). Liver Function Tests:  Recent Labs Lab 07/06/16 1219  AST 20  ALT 17  ALKPHOS 53  BILITOT 0.8  PROT 8.2*  ALBUMIN 3.9   No results for input(s): LIPASE, AMYLASE in the last 168 hours. No results for input(s): AMMONIA in the last 168 hours. Coagulation Profile: No results for input(s): INR, PROTIME in the last 168 hours. Cardiac Enzymes: No results for input(s): CKTOTAL, CKMB, CKMBINDEX, TROPONINI in the last 168 hours. BNP (last 3 results) No results for input(s): PROBNP in the last 8760 hours. HbA1C: No results for input(s): HGBA1C in the last 72 hours. CBG:  No results for input(s): GLUCAP in the last 168 hours. Lipid Profile: No results for input(s): CHOL, HDL, LDLCALC, TRIG, CHOLHDL, LDLDIRECT in the last 72 hours. Thyroid Function Tests: No results for input(s): TSH, T4TOTAL, FREET4, T3FREE, THYROIDAB in the last 72 hours. Anemia Panel: No results for input(s): VITAMINB12, FOLATE, FERRITIN, TIBC, IRON, RETICCTPCT in the last 72 hours. Urine analysis:    Component Value Date/Time   COLORURINE YELLOW 07/06/2016 1237   APPEARANCEUR HAZY (A) 07/06/2016 1237   LABSPEC 1.015 07/06/2016 1237   PHURINE 6.0 07/06/2016 1237   GLUCOSEU NEGATIVE 07/06/2016 1237   HGBUR LARGE (A) 07/06/2016 1237   BILIRUBINUR NEGATIVE 07/06/2016 1237   KETONESUR 5 (A) 07/06/2016 1237   PROTEINUR 30 (A) 07/06/2016 1237   UROBILINOGEN 1.0 01/18/2010 1310   NITRITE NEGATIVE 07/06/2016 1237   LEUKOCYTESUR MODERATE (A) 07/06/2016 1237    Radiological Exams on Admission: Dg Chest 2 View  Result Date: 07/06/2016 CLINICAL DATA:  Fever.  Weakness. EXAM: CHEST  2 VIEW COMPARISON:  10/23/2013 . FINDINGS: Mediastinum and hilar structures normal. Lungs are clear. Heart size normal. No pleural effusion or pneumothorax. Mild degenerative change thoracic spine. Prior lap band surgery. Lap band appears in stable position IMPRESSION: 1. Stable chest.  No acute cardiopulmonary disease. 2.  Prior lap band surgery. Lap band appears to be in stable position. Electronically Signed   By: Maisie Fus  Register   On: 07/06/2016 12:04    EKG: Independently reviewed. Sinus tachycardia.   Assessment/Plan Active Problems:   Sepsis (HCC)   HTN (hypertension)   Cystitis with hematuria  1-Sepsis; secondary to UTI;  Patient presents with fevers, leukocytosis, tachycardia, UA with too numerous to count WBC.  Patient has received 3 L fluids in the ED.  Continue with IV fluids, IV antibiotics.  Follow urine culture, Blood culture.   Hypokalemia;  Replete orally.   HTN; Hold BP medications.   DVT prophylaxis: Lovenox Code Status: full code.  Family Communication: care discussed with patient, husband was at bedside.  Disposition Plan: Home at time of discharge.  Consults called: None Admission status: Observation, telemetry.    Alba Cory MD [facility] Pager [phone]  If 7PM-7AM, please contact night-coverage [email or web address] Password [facility]  07/06/2016, 4:50 PM

## 2016-07-06 NOTE — ED Provider Notes (Signed)
[facility]-EMERGENCY DEPT Provider Note   CSN: [id number] Arrival date & time: 07/06/16  1122     History   Chief Complaint Chief Complaint  Patient presents with  . Fever  . Hematuria    HPI Samantha Molina is a 56 y.o. female.  HPI Samantha Molina is a 56 y.o. female with hx of HTN, presents to ED with fever 2 days, up to 102 at home. She reports associated hematuria, urinary frequency and urgency. States today has had generalized weakness and malaise. Took aleve prior to coming in for her fever. Pt denies any Abdominal pain. Denies any back pain. Denies any vaginal discharge or bleeding. Denies any urinary symptoms. Nothing making her symptoms better. No n/v/d.  Nobody around her sick.   Past Medical History:  Diagnosis Date  . Arthritis   . Depression   . Diabetes mellitus without complication (HCC)   . Hypertension   . Morbid obesity (HCC)   . Vitamin D deficiency disease 2013    Patient Active Problem List   Diagnosis Date Noted  . Lapband APL in [place] 2007 09/21/2013    Past Surgical History:  Procedure Laterality Date  . KNEE ARTHROSCOPY W/ ACL RECONSTRUCTION Left 2004  . LAPAROSCOPIC CHOLECYSTECTOMY  2005  . LAPAROSCOPIC GASTRIC BANDING  2007  . TONSILLECTOMY  1984   Age 51  . TOTAL VAGINAL HYSTERECTOMY  age 49   Ovaries remain, for menorrhagia    OB History    Gravida Para Term Preterm AB Living   SAB TAB Ectopic Multiple Live Births                   Home Medications    Prior to Admission medications   Medication Sig Start Date End Date Taking? Authorizing Provider  ALPRAZolam Prudy Feeler) 0.25 MG tablet Take 0.25 mg by mouth at bedtime as needed for anxiety.    Historical Provider, MD  Cholecalciferol (VITAMIN D) 2000 UNITS tablet Take 2,000 Units by mouth daily.      Historical Provider, MD  cyclobenzaprine (FLEXERIL) 10 MG tablet Take 1 tablet (10 mg total) by mouth 3 (three) times daily as needed for muscle spasms. 01/28/16   Azalia Bilis,  MD  desvenlafaxine (PRISTIQ) 50 MG 24 hr tablet Take 50 mg by mouth daily.      Historical Provider, MD  ibuprofen (ADVIL,MOTRIN) 400 MG tablet Take 1 tablet (400 mg total) by mouth every 8 (eight) hours as needed. 01/28/16   Azalia Bilis, MD  lisinopril-hydrochlorothiazide (PRINZIDE,ZESTORETIC) 20-25 MG per tablet Take 1 tablet by mouth daily.      Historical Provider, MD  Multiple Vitamins-Minerals (MULTIVITAMIN WITH MINERALS) tablet Take 1 tablet by mouth daily.      Historical Provider, MD  naproxen sodium (ANAPROX) 550 MG tablet Take 550 mg by mouth 2 (two) times daily with a meal.    Historical Provider, MD  Omega-3 Fatty Acids (FISH OIL) 1000 MG CAPS Take by mouth.    Historical Provider, MD  zolpidem (AMBIEN) 10 MG tablet Take 10 mg by mouth at bedtime as needed.      Historical Provider, MD    Family History Family History  Problem Relation Age of Onset  . Hypertension Mother   . Rheum arthritis Mother     Lupus  . Diabetes Father   . Hypertension Father   . Stroke Father   . Hyperlipidemia Father   . Diabetes Brother   .  Hyperlipidemia Brother   . Breast cancer Maternal Aunt 64    X 2, one in her 60's  . Breast cancer Cousin 36    maternal & paternal cousin  . Breast cancer Paternal Aunt 30    died in her 68's    Social History Social History  Substance Use Topics  . Smoking status: Never Smoker  . Smokeless tobacco: Never Used  . Alcohol use 0.5 oz/week    1 Standard drinks or equivalent per week     Comment: rarely     Allergies   Patient has no known allergies.   Review of Systems Review of Systems  Constitutional: Positive for chills, fatigue and fever.  Respiratory: Negative for cough, chest tightness and shortness of breath.   Cardiovascular: Negative for chest pain, palpitations and leg swelling.  Gastrointestinal: Negative for abdominal pain, diarrhea, nausea and vomiting.  Genitourinary: Positive for frequency, hematuria and urgency. Negative for  dysuria, flank pain, pelvic pain, vaginal bleeding, vaginal discharge and vaginal pain.  Musculoskeletal: Negative for arthralgias, myalgias, neck pain and neck stiffness.  Skin: Negative for rash.  Neurological: Positive for weakness. Negative for dizziness and headaches.  All other systems reviewed and are negative.    Physical Exam Updated Vital Signs BP 106/66 (BP Location: Left Arm)   Pulse (!) 127   Temp 99.9 F (37.7 C) (Oral)   Resp 15   Wt 92.5 kg   SpO2 97%   BMI 32.93 kg/m   Physical Exam  Constitutional: She is oriented to person, place, and time. She appears well-developed and well-nourished. No distress.  HENT:  Head: Normocephalic.  Eyes: Conjunctivae are normal.  Neck: Neck supple.  Cardiovascular: Regular rhythm and normal heart sounds.   tachycardic  Pulmonary/Chest: Effort normal and breath sounds normal. No respiratory distress. She has no wheezes. She has no rales.  Abdominal: Soft. Bowel sounds are normal. She exhibits no distension. There is no tenderness. There is no rebound.  Musculoskeletal: She exhibits no edema.  Neurological: She is alert and oriented to person, place, and time.  Skin: Skin is warm and dry.  Psychiatric: She has a normal mood and affect. Her behavior is normal.  Nursing note and vitals reviewed.    ED Treatments / Results  Labs (all labs ordered are listed, but only abnormal results are displayed) Labs Reviewed  URINALYSIS, ROUTINE W REFLEX MICROSCOPIC  COMPREHENSIVE METABOLIC PANEL  CBC WITH DIFFERENTIAL/PLATELET  I-STAT CG4 LACTIC ACID, ED    EKG  EKG Interpretation None       Radiology Dg Chest 2 View  Result Date: 07/06/2016 CLINICAL DATA:  Fever.  Weakness. EXAM: CHEST  2 VIEW COMPARISON:  10/23/2013 . FINDINGS: Mediastinum and hilar structures normal. Lungs are clear. Heart size normal. No pleural effusion or pneumothorax. Mild degenerative change thoracic spine. Prior lap band surgery. Lap band appears in  stable position IMPRESSION: 1. Stable chest.  No acute cardiopulmonary disease. 2. Prior lap band surgery. Lap band appears to be in stable position. Electronically Signed   By: Maisie Fus  Register   On: 07/06/2016 12:04    Procedures Procedures (including critical care time)  Medications Ordered in ED Medications - No data to display   Initial Impression / Assessment and Plan / ED Course  I have reviewed the triage vital signs and the nursing notes.  Pertinent labs & imaging results that were available during my care of the patient were reviewed by me and considered in my medical decision making (see chart  for details).    Patient seen and examined, patient with urinary symptoms, fever or chills, tachycardia, generalized malaise. Her temperature at home was 102, here is 101.8 after Aleve. Pt is febrile, tachycardic, with urinary symptoms, meets sepsis criteria. Will order code sepsis, fluids, labs, UA   Sepsis - Repeat Assessment  Performed at:    2:17 PM   Vitals     Blood pressure 130/65, pulse (!) 112, temperature (!) 101.8 F (38.8 C), temperature source Oral, resp. rate 18, weight 92.5 kg, SpO2 98 %.  Heart:     Tachycardic  Lungs:    CTA  Capillary Refill:   <2 sec  Peripheral Pulse:   Radial pulse palpable  Skin:     Dry   2:18 PM Pt continues to feel bad, Still tachycardic, BP normal. UA infected. Plan to admit for further evaluation and treatment.  Spoke with [facility], will admit.  Vitals:   07/06/16 1128 07/06/16 1133 07/06/16 1241 07/06/16 1332  BP: 106/66  (!) 142/77 130/65  Pulse: (!) 127  (!) 116 (!) 112  Resp: Temp: 99.9 F (37.7 C)  (!) 101.8 F (38.8 C)   TempSrc: Oral  Oral   SpO2: 97%  97% 98%  Weight:  92.5 kg      Final Clinical Impressions(s) / ED Diagnoses   Final diagnoses:  Hemorrhagic cystitis  Sepsis, due to unspecified organism [facility])    New Prescriptions New Prescriptions   No medications on file     Jaynie Crumble, PA-C 07/06/16 1607    Lorre Nick, MD 07/07/16 1640

## 2016-07-06 NOTE — ED Notes (Signed)
Pt O2 sat consistently below 93% on RA.  RN began O2 via  at 2L

## 2016-07-06 NOTE — ED Triage Notes (Signed)
Pt reports fever of 102F since last night. Has been taking antipyretics for this. Otherwise complains of generalized weakness. Has had hematuria for the past week.

## 2016-07-06 NOTE — ED Notes (Signed)
Patient transported to X-ray 

## 2016-07-06 NOTE — ED Notes (Signed)
ED monitoring equipment changed.  Pt is able to maintain O2 sat at or above 93 on RA.  O2 DC'd

## 2016-07-06 NOTE — Progress Notes (Signed)
Pharmacy Antibiotic Note  Samantha Molina is a 56 y.o. female admitted on 07/06/2016 with UTI.  Pharmacy has been consulted for Ceftriaxone dosing. Temp 101.8. Lactate WNL. CXR clear  Plan: Rocephin 2 gm IV x 1 per EDP then Rocephin 1 gm IV q24 for UTI Pharmacy to sign off   Weight: 204 lb (92.5 kg)  Temp (24hrs), Avg:100.9 F (38.3 C), Min:99.9 F (37.7 C), Max:101.8 F (38.8 C)   Recent Labs Lab 07/06/16 1228  LATICACIDVEN 1.36    CrCl cannot be calculated (Patient's most recent lab result is older than the maximum 21 days allowed.).     Thank you for allowing pharmacy to be a part of this patient's care.  Herby Abraham, Pharm.D. [phone] 07/06/2016 12:51 PM

## 2016-07-06 NOTE — ED Notes (Signed)
1st set of blood cultures drawn by RN with IV placement

## 2016-07-07 DIAGNOSIS — N10 Acute tubulo-interstitial nephritis: Secondary | ICD-10-CM | POA: Diagnosis not present

## 2016-07-07 DIAGNOSIS — R509 Fever, unspecified: Secondary | ICD-10-CM | POA: Diagnosis present

## 2016-07-07 DIAGNOSIS — R7302 Impaired glucose tolerance (oral): Secondary | ICD-10-CM | POA: Diagnosis present

## 2016-07-07 DIAGNOSIS — F419 Anxiety disorder, unspecified: Secondary | ICD-10-CM | POA: Diagnosis present

## 2016-07-07 DIAGNOSIS — Z791 Long term (current) use of non-steroidal anti-inflammatories (NSAID): Secondary | ICD-10-CM | POA: Diagnosis not present

## 2016-07-07 DIAGNOSIS — Z833 Family history of diabetes mellitus: Secondary | ICD-10-CM | POA: Diagnosis not present

## 2016-07-07 DIAGNOSIS — Z9049 Acquired absence of other specified parts of digestive tract: Secondary | ICD-10-CM | POA: Diagnosis not present

## 2016-07-07 DIAGNOSIS — A419 Sepsis, unspecified organism: Secondary | ICD-10-CM | POA: Diagnosis not present

## 2016-07-07 DIAGNOSIS — I10 Essential (primary) hypertension: Secondary | ICD-10-CM

## 2016-07-07 DIAGNOSIS — F329 Major depressive disorder, single episode, unspecified: Secondary | ICD-10-CM | POA: Diagnosis present

## 2016-07-07 DIAGNOSIS — Z823 Family history of stroke: Secondary | ICD-10-CM | POA: Diagnosis not present

## 2016-07-07 DIAGNOSIS — Z6832 Body mass index (BMI) 32.0-32.9, adult: Secondary | ICD-10-CM | POA: Diagnosis not present

## 2016-07-07 DIAGNOSIS — Z9884 Bariatric surgery status: Secondary | ICD-10-CM | POA: Diagnosis not present

## 2016-07-07 DIAGNOSIS — E119 Type 2 diabetes mellitus without complications: Secondary | ICD-10-CM | POA: Diagnosis present

## 2016-07-07 DIAGNOSIS — N3091 Cystitis, unspecified with hematuria: Secondary | ICD-10-CM | POA: Diagnosis not present

## 2016-07-07 DIAGNOSIS — E669 Obesity, unspecified: Secondary | ICD-10-CM | POA: Diagnosis present

## 2016-07-07 DIAGNOSIS — E559 Vitamin D deficiency, unspecified: Secondary | ICD-10-CM | POA: Diagnosis present

## 2016-07-07 DIAGNOSIS — Z8249 Family history of ischemic heart disease and other diseases of the circulatory system: Secondary | ICD-10-CM | POA: Diagnosis not present

## 2016-07-07 DIAGNOSIS — E876 Hypokalemia: Secondary | ICD-10-CM | POA: Diagnosis present

## 2016-07-07 DIAGNOSIS — Z90710 Acquired absence of both cervix and uterus: Secondary | ICD-10-CM | POA: Diagnosis not present

## 2016-07-07 LAB — CBC
HCT: 33.3 % — ABNORMAL LOW (ref 36.0–46.0)
Hemoglobin: 10.5 g/dL — ABNORMAL LOW (ref 12.0–15.0)
MCH: 23 pg — ABNORMAL LOW (ref 26.0–34.0)
MCHC: 31.5 g/dL (ref 30.0–36.0)
MCV: 72.9 fL — ABNORMAL LOW (ref 78.0–100.0)
PLATELETS: 238 10*3/uL (ref 150–400)
RBC: 4.57 MIL/uL (ref 3.87–5.11)
RDW: 14.9 % (ref 11.5–15.5)
WBC: 14.6 10*3/uL — AB (ref 4.0–10.5)

## 2016-07-07 LAB — BASIC METABOLIC PANEL
ANION GAP: 7 (ref 5–15)
BUN: 12 mg/dL (ref 6–20)
CALCIUM: 8.7 mg/dL — AB (ref 8.9–10.3)
CO2: 22 mmol/L (ref 22–32)
CREATININE: 0.77 mg/dL (ref 0.44–1.00)
Chloride: 113 mmol/L — ABNORMAL HIGH (ref 101–111)
Glucose, Bld: 101 mg/dL — ABNORMAL HIGH (ref 65–99)
Potassium: 3.2 mmol/L — ABNORMAL LOW (ref 3.5–5.1)
SODIUM: 142 mmol/L (ref 135–145)

## 2016-07-07 LAB — HIV ANTIBODY (ROUTINE TESTING W REFLEX): HIV Screen 4th Generation wRfx: NONREACTIVE

## 2016-07-07 MED ORDER — POTASSIUM CHLORIDE CRYS ER 20 MEQ PO TBCR
40.0000 meq | EXTENDED_RELEASE_TABLET | Freq: Once | ORAL | Status: AC
  Administered 2016-07-07: 40 meq via ORAL
  Filled 2016-07-07: qty 2

## 2016-07-07 MED ORDER — SODIUM CHLORIDE 0.9 % IV SOLN
INTRAVENOUS | Status: AC
  Administered 2016-07-07 (×2): via INTRAVENOUS
  Filled 2016-07-07 (×2): qty 1000

## 2016-07-07 MED ORDER — OXYCODONE HCL 5 MG PO TABS
5.0000 mg | ORAL_TABLET | Freq: Four times a day (QID) | ORAL | Status: DC | PRN
  Administered 2016-07-07 – 2016-07-09 (×6): 5 mg via ORAL
  Filled 2016-07-07 (×6): qty 1

## 2016-07-07 MED ORDER — OXYCODONE-ACETAMINOPHEN 5-325 MG PO TABS
1.0000 | ORAL_TABLET | Freq: Four times a day (QID) | ORAL | Status: DC | PRN
  Administered 2016-07-07 – 2016-07-09 (×5): 1 via ORAL
  Filled 2016-07-07 (×5): qty 1

## 2016-07-07 NOTE — Progress Notes (Signed)
PROGRESS NOTE  Samantha Molina:[id number] DOB: 1961/02/14 DOA: 07/06/2016 PCP: Leanor Rubenstein, MD  Brief History:  56 year old female with a history of hypertension, impaired glucose tolerance, depression presented with two-week history of worsening suprapubic pain, hematuria, and urinary urgency. The patient subsequently developed a fever up to 102.77F 2 days prior to admission. The patient had some nausea without any emesis. She denied any chest pain, short spell, coughing, hemoptysis, headache, neck pain, diarrhea. The patient subsequently has developed left lower quadrant left flank pain of one day duration. Upon presentation, she was found to have WBC 19.1 with fever 101.42F. The patient was on ceftriaxone after cultures were obtained.  Assessment/Plan: Sepsis -Manifested by fever, tachycardia, and leukocytosis -Secondary to pyelonephritis -Lactic acid 1.36 -Continue IV fluids -Continue IV antibiotics pending culture data  Pyelonephritis -Clinically, the patient has complicated urinary tract infection -Restart home dose Percocet for flank pain -WBC improving -Continue ceftriaxone pending culture data  Hypertension -Holding amlodipine and lisinopril/HCTZ in the setting of soft blood pressures and sepsis  Impaired glucose tolerance -Patient states last hemoglobin A1c 5.9 -Repeat A1c  Hypokalemia -replete -check mag  Depression/anxiety Continue venlafaxine, Topamax, alprazolam    Disposition Plan:   Home in 1-2 days  Family Communication:  No Family at bedside--Total time spent 35 minutes.  Greater than 50% spent face to face counseling and coordinating care.   Consultants:  none  Code Status:  FULL   DVT Prophylaxis:  [place] Lovenox   Procedures: As Listed in Progress Note Above  Antibiotics: Ceftriaxone 4/16>>>    Subjective: Pt c/o left flank pain. +Nausea without emesis.  Patient denies fevers, chills, headache, chest pain, dyspnea, vomiting,  diarrhea, abdominal pain, dysuria, hematuria, hematochezia, and melena.   Objective: Vitals:   07/06/16 1523 07/06/16 1729 07/06/16 1900 07/07/16 0518  BP: 112/74 114/72 112/64 118/61  Pulse: (!) 101 84 87 85  Resp: (!) Temp: (!) 100.9 F (38.3 C)  99 F (37.2 C) 98.9 F (37.2 C)  TempSrc: Oral  Oral Oral  SpO2: 98% 98% 98%   Weight:  93.9 kg (207 lb)  96.8 kg (213 lb 6.5 oz)  Height:   (1.702 m)      Intake/Output Summary (Last 24 hours) at 07/07/16 1022 Last data filed at 07/07/16 0943  Gross per 24 hour  Intake             4885 ml  Output                0 ml  Net             4885 ml   Weight change:  Exam:   General:  Pt is alert, follows commands appropriately, not in acute distress  HEENT: No icterus, No thrush, No neck mass, [place]/AT  Cardiovascular: RRR, S1/S2, no rubs, no gallops  Respiratory: CTA bilaterally, no wheezing, no crackles, no rhonchi  Abdomen: Soft/+BS, L-flank tender, non distended, no guarding  Extremities: No edema, No lymphangitis, No petechiae, No rashes, no synovitis   Data Reviewed: I have personally reviewed following labs and imaging studies Basic Metabolic Panel:  Recent Labs Lab 07/06/16 1219 07/07/16 0626  NA 142 142  K 3.0* 3.2*  CL 107 113*  CO2 25 22  GLUCOSE 133* 101*  BUN 17 12  CREATININE 1.11* 0.77  CALCIUM 9.7 8.7*   Liver Function Tests:  Recent Labs Lab 07/06/16 1219  AST  20  ALT 17  ALKPHOS 53  BILITOT 0.8  PROT 8.2*  ALBUMIN 3.9   No results for input(s): LIPASE, AMYLASE in the last 168 hours. No results for input(s): AMMONIA in the last 168 hours. Coagulation Profile: No results for input(s): INR, PROTIME in the last 168 hours. CBC:  Recent Labs Lab 07/06/16 1219 07/07/16 0626  WBC 19.1* 14.6*  NEUTROABS 15.9*  --   HGB 13.3 10.5*  HCT 41.6 33.3*  MCV 74.6* 72.9*  PLT 294 238   Cardiac Enzymes: No results for input(s): CKTOTAL, CKMB, CKMBINDEX, TROPONINI in the last  168 hours. BNP: Invalid input(s): POCBNP CBG: No results for input(s): GLUCAP in the last 168 hours. HbA1C: No results for input(s): HGBA1C in the last 72 hours. Urine analysis:    Component Value Date/Time   COLORURINE YELLOW 07/06/2016 1237   APPEARANCEUR HAZY (A) 07/06/2016 1237   LABSPEC 1.015 07/06/2016 1237   PHURINE 6.0 07/06/2016 1237   GLUCOSEU NEGATIVE 07/06/2016 1237   HGBUR LARGE (A) 07/06/2016 1237   BILIRUBINUR NEGATIVE 07/06/2016 1237   KETONESUR 5 (A) 07/06/2016 1237   PROTEINUR 30 (A) 07/06/2016 1237   UROBILINOGEN 1.0 01/18/2010 1310   NITRITE NEGATIVE 07/06/2016 1237   LEUKOCYTESUR MODERATE (A) 07/06/2016 1237   Sepsis Labs: (procalcitonin:4,lacticidven:4) )No results found for this or any previous visit (from the past 240 hour(s)).   Scheduled Meds: . cholecalciferol  2,000 Units Oral Daily  . enoxaparin (LOVENOX) injection  40 mg Subcutaneous Q24H  . multivitamin with minerals  1 tablet Oral Daily  . omega-3 acid ethyl esters  1 g Oral Daily  . topiramate  100 mg Oral BID  . venlafaxine XR  37.5 mg Oral Q breakfast   Continuous Infusions: . sodium chloride 100 mL/hr at 07/07/16 0234  . cefTRIAXone (ROCEPHIN)  IV      Procedures/Studies: Dg Chest 2 View  Result Date: 07/06/2016 CLINICAL DATA:  Fever.  Weakness. EXAM: CHEST  2 VIEW COMPARISON:  10/23/2013 . FINDINGS: Mediastinum and hilar structures normal. Lungs are clear. Heart size normal. No pleural effusion or pneumothorax. Mild degenerative change thoracic spine. Prior lap band surgery. Lap band appears in stable position IMPRESSION: 1. Stable chest.  No acute cardiopulmonary disease. 2. Prior lap band surgery. Lap band appears to be in stable position. Electronically Signed   By: Maisie Fus  Register   On: 07/06/2016 12:04    Samantha Ybanez, DO  [facility] Pager [phone]  If 7PM-7AM, please contact night-coverage [email or web address] Password [email or web address] 07/07/2016, 10:22 AM   LOS: 1 day

## 2016-07-07 NOTE — Consult Note (Signed)
   [facility] CM Inpatient Consult   07/07/2016  DAWN CONVERY [birth year] [phone]    Came to visit Mrs. Tarver on behalf of [facility] Care Management/Link to Wellness program for [employer] employees/dependents with [employer]. Discussed Link to Wellness for DM management. Mrs. Marik denies having DM. States her HgB  A1c was 5.9. Explained Mrs. Unterreiner the [employer] to [employer] and its benefits if she should become interested in participating. Provided Link to [employer], 24-hr nurse line magnet, and contact information. Agreeable to post hospital discharge call. Confirmed best contact number as [phone]. Appreciative of visit. Made inpatient RNCM aware.    Raiford Noble, MSN-Ed, RN,BSN [facility] Liaison [phone]

## 2016-07-08 LAB — MAGNESIUM: Magnesium: 1.8 mg/dL (ref 1.7–2.4)

## 2016-07-08 LAB — CBC
HEMATOCRIT: 34.3 % — AB (ref 36.0–46.0)
Hemoglobin: 10.9 g/dL — ABNORMAL LOW (ref 12.0–15.0)
MCH: 23.4 pg — AB (ref 26.0–34.0)
MCHC: 31.8 g/dL (ref 30.0–36.0)
MCV: 73.8 fL — AB (ref 78.0–100.0)
PLATELETS: 232 10*3/uL (ref 150–400)
RBC: 4.65 MIL/uL (ref 3.87–5.11)
RDW: 15.2 % (ref 11.5–15.5)
WBC: 9.3 10*3/uL (ref 4.0–10.5)

## 2016-07-08 LAB — BASIC METABOLIC PANEL
Anion gap: 6 (ref 5–15)
BUN: 7 mg/dL (ref 6–20)
CALCIUM: 9 mg/dL (ref 8.9–10.3)
CO2: 21 mmol/L — AB (ref 22–32)
Chloride: 113 mmol/L — ABNORMAL HIGH (ref 101–111)
Creatinine, Ser: 0.75 mg/dL (ref 0.44–1.00)
GFR calc Af Amer: >60 mL/min (ref >60)
GLUCOSE: 93 mg/dL (ref 65–99)
Potassium: 4.1 mmol/L (ref 3.5–5.1)
Sodium: 140 mmol/L (ref 135–145)

## 2016-07-08 LAB — URINE CULTURE

## 2016-07-08 MED ORDER — POTASSIUM CHLORIDE CRYS ER 20 MEQ PO TBCR
40.0000 meq | EXTENDED_RELEASE_TABLET | Freq: Once | ORAL | Status: AC
  Administered 2016-07-08: 40 meq via ORAL
  Filled 2016-07-08: qty 2

## 2016-07-08 NOTE — Progress Notes (Signed)
PROGRESS NOTE    Samantha Molina  WGN:[id number] DOB: [birth year] DOA: 07/06/2016 PCP: Leanor Rubenstein, MD   Chief Complaint  Patient presents with  . Fever  . Hematuria    Brief Narrative:  HPI on 07/06/2016 by Dr. Hartley Barefoot LEWIS GRIVAS is a 56 y.o. female with medical history significant of HTN, Obesity, who presents complaining of fever since the night prior to admission. She also report blood in the urine. She relates supra-pubic pain that started couple of weeks ago, denies dysuria. She is feeling tired, sleepy. She denies chest pain, dyspnea, vaginal bleeding. She denies flank pain, back pain.   Assessment & Plan   Sepsis secondary to UTI/Acute pyelonephritis -On admission, patient was noted to be febrile with tachycardia and leukocytosis -Leukocytosis and vital signs improving -Patient did present with left-sided flank pain- and continues to have pain -UA: TNTC PBC, many bacteria, moderate leukocytes -Blood cultures show no growth to date -Urine culture: 70K Citrobacter koseri -Continue ceftriaxone -continue pain control  Essential hypertension -Upon admission, linopril/HCTZ and amlodipine held as patient did have soft blood pressures  Impaired glucose tolerance -Hemolgobin A1c pending   Hypokalemia -Resolved with supplementation -Continue to monitor BMP  Depression/anxiety -Continue venlafaxine, alprazolam  DVT Prophylaxis  Lovenox  Code Status: Full  Family Communication: None at bedside  Disposition Plan: Admitted. Pending improvement in patient's pain. Likely discharge to home 07/09/2016  Consultants None  Procedures  None  Antibiotics   Anti-infectives    Start     Dose/Rate Route Frequency Ordered Stop   07/07/16 1000  cefTRIAXone (ROCEPHIN) 1 g in dextrose 5 % 50 mL IVPB     1 g 100 mL/hr over 30 Minutes Intravenous Every 24 hours 07/06/16 1257     07/06/16 1245  cefTRIAXone (ROCEPHIN) 2 g in dextrose 5 % 50 mL IVPB     2 g 100 mL/hr  over 30 Minutes Intravenous  Once 07/06/16 1242 07/06/16 1530      Subjective:   Samantha Molina seen and examined today.  Patient continues complain of left sided flank pain. Feels it has not improved. States she was "peeing blood" before coming to the hospital . Denies chest pain, shortness of breath. Has no appetite. Denies nausea or vomiting.   Objective:   Vitals:   07/07/16 2044 07/08/16 0422 07/08/16 0440 07/08/16 0754  BP: 120/67 134/73    Pulse: 76 96    Resp: 17 18    Temp: 99.8 F (37.7 C) 100 F (37.8 C)  99.1 F (37.3 C)  TempSrc: Oral Oral  Oral  SpO2: 99% 100%    Weight:   99.1 kg (218 lb 8 oz)   Height:        Intake/Output Summary (Last 24 hours) at 07/08/16 1236 Last data filed at 07/08/16 0859  Gross per 24 hour  Intake          2386.25 ml  Output                0 ml  Net          2386.25 ml   Filed Weights   07/06/16 1729 07/07/16 0518 07/08/16 0440  Weight: 93.9 kg (207 lb) 96.8 kg (213 lb 6.5 oz) 99.1 kg (218 lb 8 oz)    Exam  General: Well developed, well nourished, NAD, appears stated age  HEENT: NCAT,mucous membranes moist.   Cardiovascular: S1 S2 auscultated, no rubs, murmurs or gallops. Regular rate and rhythm.  Respiratory: Clear to  auscultation bilaterally with equal chest rise  Abdomen: Soft, obese, nontender, nondistended, + bowel sounds  Extremities: warm dry without cyanosis clubbing or edema  Neuro: AAOx3, nonfocal  Psych: Normal affect and demeanor with intact judgement and insight   Data Reviewed: I have personally reviewed following labs and imaging studies  CBC:  Recent Labs Lab 07/06/16 1219 07/07/16 0626 07/08/16 0651  WBC 19.1* 14.6* 9.3  NEUTROABS 15.9*  --   --   HGB 13.3 10.5* 10.9*  HCT 41.6 33.3* 34.3*  MCV 74.6* 72.9* 73.8*  PLT 294 238 232   Basic Metabolic Panel:  Recent Labs Lab 07/06/16 1219 07/07/16 0626 07/08/16 0651  NA 142 142 140  K 3.0* 3.2* 4.1  CL 107 113* 113*  CO2 25 22 21*    GLUCOSE 133* 101* 93  BUN CREATININE 1.11* 0.77 0.75  CALCIUM 9.7 8.7* 9.0  MG  --   --  1.8   GFR: Estimated Creatinine Clearance: 95 mL/min (by C-G formula based on SCr of 0.75 mg/dL). Liver Function Tests:  Recent Labs Lab 07/06/16 1219  AST 20  ALT 17  ALKPHOS 53  BILITOT 0.8  PROT 8.2*  ALBUMIN 3.9   No results for input(s): LIPASE, AMYLASE in the last 168 hours. No results for input(s): AMMONIA in the last 168 hours. Coagulation Profile: No results for input(s): INR, PROTIME in the last 168 hours. Cardiac Enzymes: No results for input(s): CKTOTAL, CKMB, CKMBINDEX, TROPONINI in the last 168 hours. BNP (last 3 results) No results for input(s): PROBNP in the last 8760 hours. HbA1C: No results for input(s): HGBA1C in the last 72 hours. CBG: No results for input(s): GLUCAP in the last 168 hours. Lipid Profile: No results for input(s): CHOL, HDL, LDLCALC, TRIG, CHOLHDL, LDLDIRECT in the last 72 hours. Thyroid Function Tests: No results for input(s): TSH, T4TOTAL, FREET4, T3FREE, THYROIDAB in the last 72 hours. Anemia Panel: No results for input(s): VITAMINB12, FOLATE, FERRITIN, TIBC, IRON, RETICCTPCT in the last 72 hours. Urine analysis:    Component Value Date/Time   COLORURINE YELLOW 07/06/2016 1237   APPEARANCEUR HAZY (A) 07/06/2016 1237   LABSPEC 1.015 07/06/2016 1237   PHURINE 6.0 07/06/2016 1237   GLUCOSEU NEGATIVE 07/06/2016 1237   HGBUR LARGE (A) 07/06/2016 1237   BILIRUBINUR NEGATIVE 07/06/2016 1237   KETONESUR 5 (A) 07/06/2016 1237   PROTEINUR 30 (A) 07/06/2016 1237   UROBILINOGEN 1.0 01/18/2010 1310   NITRITE NEGATIVE 07/06/2016 1237   LEUKOCYTESUR MODERATE (A) 07/06/2016 1237   Sepsis Labs: (procalcitonin:4,lacticidven:4)  ) Recent Results (from the past 240 hour(s))  Urine culture     Status: Abnormal   Collection Time: 07/06/16 12:37 PM  Result Value Ref Range Status   Specimen Description URINE, CLEAN CATCH  Final    Special Requests NONE  Final   Culture 70,000 COLONIES/mL CITROBACTER KOSERI (A)  Final   Report Status 07/08/2016 FINAL  Final   Organism ID, Bacteria CITROBACTER KOSERI (A)  Final      Susceptibility   Citrobacter koseri - MIC*    CEFAZOLIN <=4 SENSITIVE Sensitive     CEFTRIAXONE <=1 SENSITIVE Sensitive     CIPROFLOXACIN <=0.25 SENSITIVE Sensitive     GENTAMICIN <=1 SENSITIVE Sensitive     IMIPENEM <=0.25 SENSITIVE Sensitive     NITROFURANTOIN 64 INTERMEDIATE Intermediate     TRIMETH/SULFA <=20 SENSITIVE Sensitive     PIP/TAZO <=4 SENSITIVE Sensitive     * 70,000 COLONIES/mL CITROBACTER KOSERI  Blood culture (routine  x 2)     Status: None (Preliminary result)   Collection Time: 07/06/16  1:11 PM  Result Value Ref Range Status   Specimen Description BLOOD RIGHT HAND  Final   Special Requests IN PEDIATRIC BOTTLE Blood Culture adequate volume  Final   Culture   Final    NO GROWTH < 24 HOURS Performed at [facility], [address], [place], [place] [address]    Report Status PENDING  Incomplete  Blood culture (routine x 2)     Status: None (Preliminary result)   Collection Time: 07/06/16  1:11 PM  Result Value Ref Range Status   Specimen Description BLOOD LEFT ANTECUBITAL  Final   Special Requests   Final    BOTTLES DRAWN AEROBIC ONLY Blood Culture adequate volume   Culture   Final    NO GROWTH < 24 HOURS Performed at [facility], [address], [place], [place] [address]    Report Status PENDING  Incomplete      Radiology Studies: No results found.   Scheduled Meds: . cholecalciferol  2,000 Units Oral Daily  . enoxaparin (LOVENOX) injection  40 mg Subcutaneous Q24H  . multivitamin with minerals  1 tablet Oral Daily  . omega-3 acid ethyl esters  1 g Oral Daily  . topiramate  100 mg Oral BID  . venlafaxine XR  37.5 mg Oral Q breakfast   Continuous Infusions: . cefTRIAXone (ROCEPHIN)  IV Stopped (07/08/16 0940)     LOS: 2 days   Time Spent in  minutes   30 minutes  Aliyyah Riese D.O. on 07/08/2016 at 12:36 PM  Between 7am to 7pm - Pager - [phone]  After 7pm go to [email or web address] - password [email or web address]  And look for the night coverage person covering for me after hours  [facility] Office  [phone]

## 2016-07-09 LAB — HEMOGLOBIN A1C
Hgb A1c MFr Bld: 5.6 % (ref 4.8–5.6)
Mean Plasma Glucose: 114 mg/dL

## 2016-07-09 MED ORDER — ONDANSETRON HCL 4 MG PO TABS: 4.0000 mg | ORAL_TABLET | Freq: Three times a day (TID) | ORAL | 0 refills | Status: DC | PRN

## 2016-07-09 MED ORDER — CEFUROXIME AXETIL 500 MG PO TABS: 500.0000 mg | ORAL_TABLET | Freq: Two times a day (BID) | ORAL | 0 refills | Status: DC

## 2016-07-09 MED FILL — CEFUROXIME AXETIL 500 MG TA: 500 | 5 days supply | Qty: 10 | Fill #0

## 2016-07-09 MED FILL — OXYCODONE-APAP 10-325: 10-325 | 30 days supply | Qty: 90 | Fill #0

## 2016-07-09 MED FILL — ONDANSETRON HCL 4 MG TABLET: 4 | 7 days supply | Qty: 20 | Fill #0

## 2016-07-09 NOTE — Progress Notes (Signed)
Date: July 09, 2016 Discharge orders review for case management needs.  None found Ananias Kolander, BSN, RN3, CCM: [phone] 

## 2016-07-09 NOTE — Progress Notes (Signed)
Pt discharged from the unit via wheelchair. Discharge instructions were reviewed with the pt. No questions or concerns at this time.  Julie-Ann Vanmaanen W Million Maharaj, RN 

## 2016-07-09 NOTE — Discharge Summary (Signed)
Physician Discharge Summary  Samantha Molina ZOX:[id number] Samantha [id number] DOA: 07/06/2016  PCP: Leanor Rubenstein, MD  Admit date: 07/06/2016 Discharge date: 07/09/2016  Time spent:  minutes  Recommendations for Outpatient Follow-up:  Patient will be discharged to home.  Patient will need to follow up with primary care provider within one week of discharge.  Patient should continue medications as prescribed.  Patient should follow a heart healthy diet.   Discharge Diagnoses:  Sepsis secondary to UTI/Acute pyelonephritis Essential hypertension Impaired glucose tolerance Hypokalemia Depression/anxiety  Discharge Condition: Stable  Diet recommendation: heart healthy  Filed Weights   07/07/16 0518 07/08/16 0440 07/09/16 0554  Weight: 96.8 kg (213 lb 6.5 oz) 99.1 kg (218 lb 8 oz) 98.7 kg (217 lb 9.5 oz)    History of present illness:  on 07/06/2016 by Dr. Thersa Salt McNairis a 56 y.o.femalewith medical history significant of HTN, Obesity, who presents complaining of fever since the night prior to admission. She also report blood in the urine. She relates supra-pubic pain that started couple of weeks ago, denies dysuria. She is feeling tired, sleepy. She denies chest pain, dyspnea, vaginal bleeding. She denies flank pain, back pain.   Hospital Course:  Sepsis secondary to UTI/Acute pyelonephritis -On admission, patient was noted to be febrile with tachycardia and leukocytosis -Leukocytosis and vital signs have normalized -Patient did present with left-sided flank pain- and continues to have pain -UA: TNTC PBC, many bacteria, moderate leukocytes -Blood cultures show no growth to date -Urine culture: 70K Citrobacter koseri -Was placed on ceftriaxone, will discharge with ceftin  -continue pain control  Essential hypertension -Upon admission, linopril/HCTZ and amlodipine held as patient did have soft blood pressures -May resume medications at discharge  Impaired  glucose tolerance -Hemolgobin A1c 5.6  Hypokalemia -Resolved with supplementation -Continue to monitor BMP  Depression/anxiety -Continue venlafaxine, alprazolam  Consultants None  Procedures  None  Discharge Exam: Vitals:   07/08/16 2218 07/09/16 0554  BP: 132/83 132/82  Pulse: 74 (!) 59  Resp: 18 18  Temp: 99 F (37.2 C) 98.5 F (36.9 C)   Feeling better today. Denies chest pain, shortness of breath, abdominal pain, nausea or vomiting.   Exam  General: Well developed, well nourished, NAD, appears stated age  HEENT: NCAT,mucous membranes moist.   Cardiovascular: S1 S2 auscultated, no murmurs, RRR  Respiratory: Clear to auscultation bilaterally with equal chest rise  Abdomen: Soft, obese, nontender, nondistended, + bowel sounds  Extremities: warm dry without cyanosis clubbing or edema  Neuro: AAOx3, nonfocal  Psych: Normal affect and demeanor, pleasant  Discharge Instructions Discharge Instructions    AMB Referral to [facility] Care Management    Complete by:  As directed    Please assign UMR member for post discharge call. Currently at [facility]. Provided Link to Wellness packet for DM management. Please call with questions. Raiford Noble, MSN-Ed, RN,BSN [facility] Liaison-[phone]   Reason for consult:  Please assign UMR member for post discharge   Expected date of contact:  1-3 days (reserved for hospital discharges)   Discharge instructions    Complete by:  As directed    Patient will be discharged to home.  Patient will need to follow up with primary care provider within one week of discharge.  Patient should continue medications as prescribed.  Patient should follow a heart healthy diet.     Current Discharge Medication List    START taking these medications   Details  cefUROXime (CEFTIN) 500 MG tablet Take  1 tablet (500 mg total) by mouth 2 (two) times daily with a meal. Qty: 10 tablet, Refills: 0      CONTINUE these  medications which have NOT CHANGED   Details  ALPRAZolam (XANAX) 0.25 MG tablet Take 0.25 mg by mouth at bedtime as needed for anxiety.    amLODipine (NORVASC) 10 MG tablet Take 10 mg by mouth daily. Refills: 1    Cholecalciferol (VITAMIN D) 2000 UNITS tablet Take 2,000 Units by mouth daily.      desvenlafaxine (PRISTIQ) 50 MG 24 hr tablet Take 50 mg by mouth daily.      lisinopril-hydrochlorothiazide (PRINZIDE,ZESTORETIC) 20-25 MG per tablet Take 1 tablet by mouth daily.      Multiple Vitamins-Minerals (MULTIVITAMIN WITH MINERALS) tablet Take 1 tablet by mouth daily.      Omega-3 Fatty Acids (FISH OIL) 1000 MG CAPS Take by mouth.    oxyCODONE-acetaminophen (PERCOCET) 10-325 MG tablet Take 1 tablet by mouth every 8 (eight) hours as needed. Refills: 0    topiramate (TOPAMAX) 100 MG tablet Take 100 mg by mouth 2 (two) times daily. Refills: 4    zolpidem (AMBIEN) 10 MG tablet Take 10 mg by mouth at bedtime as needed for sleep.       STOP taking these medications     cyclobenzaprine (FLEXERIL) 10 MG tablet      ibuprofen (ADVIL,MOTRIN) 400 MG tablet        No Known Allergies Follow-up Information    SUN,VYVYAN Y, MD. Schedule an appointment as soon as possible for a visit in 1 week(s).   Specialty:  Family Medicine Why:  Hospital follow up Contact information: [address] [place] [address] [phone]            The results of significant diagnostics from this hospitalization (including imaging, microbiology, ancillary and laboratory) are listed below for reference.    Significant Diagnostic Studies: Dg Chest 2 View  Result Date: 07/06/2016 CLINICAL DATA:  Fever.  Weakness. EXAM: CHEST  2 VIEW COMPARISON:  10/23/2013 . FINDINGS: Mediastinum and hilar structures normal. Lungs are clear. Heart size normal. No pleural effusion or pneumothorax. Mild degenerative change thoracic spine. Prior lap band surgery. Lap band appears in stable position  IMPRESSION: 1. Stable chest.  No acute cardiopulmonary disease. 2. Prior lap band surgery. Lap band appears to be in stable position. Electronically Signed   By: Maisie Fus  Register   On: 07/06/2016 12:04    Microbiology: Recent Results (from the past 240 hour(s))  Urine culture     Status: Abnormal   Collection Time: 07/06/16 12:37 PM  Result Value Ref Range Status   Specimen Description URINE, CLEAN CATCH  Final   Special Requests NONE  Final   Culture 70,000 COLONIES/mL CITROBACTER KOSERI (A)  Final   Report Status 07/08/2016 FINAL  Final   Organism ID, Bacteria CITROBACTER KOSERI (A)  Final      Susceptibility   Citrobacter koseri - MIC*    CEFAZOLIN <=4 SENSITIVE Sensitive     CEFTRIAXONE <=1 SENSITIVE Sensitive     CIPROFLOXACIN <=0.25 SENSITIVE Sensitive     GENTAMICIN <=1 SENSITIVE Sensitive     IMIPENEM <=0.25 SENSITIVE Sensitive     NITROFURANTOIN 64 INTERMEDIATE Intermediate     TRIMETH/SULFA <=20 SENSITIVE Sensitive     PIP/TAZO <=4 SENSITIVE Sensitive     * 70,000 COLONIES/mL CITROBACTER KOSERI  Blood culture (routine x 2)     Status: None (Preliminary result)   Collection Time: 07/06/16  1:11 PM  Result Value Ref Range Status   Specimen Description BLOOD RIGHT HAND  Final   Special Requests IN PEDIATRIC BOTTLE Blood Culture adequate volume  Final   Culture   Final    NO GROWTH 2 DAYS Performed at [facility], [address] [place], [place] [address]    Report Status PENDING  Incomplete  Blood culture (routine x 2)     Status: None (Preliminary result)   Collection Time: 07/06/16  1:11 PM  Result Value Ref Range Status   Specimen Description BLOOD LEFT ANTECUBITAL  Final   Special Requests   Final    BOTTLES DRAWN AEROBIC ONLY Blood Culture adequate volume   Culture   Final    NO GROWTH 2 DAYS Performed at [facility], [address] [place], [place] [address]    Report Status PENDING  Incomplete     Labs: Basic Metabolic Panel:  Recent  Labs Lab 07/06/16 1219 07/07/16 0626 07/08/16 0651  NA 142 142 140  K 3.0* 3.2* 4.1  CL 107 113* 113*  CO2 25 22 21*  GLUCOSE 133* 101* 93  BUN CREATININE 1.11* 0.77 0.75  CALCIUM 9.7 8.7* 9.0  MG  --   --  1.8   Liver Function Tests:  Recent Labs Lab 07/06/16 1219  AST 20  ALT 17  ALKPHOS 53  BILITOT 0.8  PROT 8.2*  ALBUMIN 3.9   No results for input(s): LIPASE, AMYLASE in the last 168 hours. No results for input(s): AMMONIA in the last 168 hours. CBC:  Recent Labs Lab 07/06/16 1219 07/07/16 0626 07/08/16 0651  WBC 19.1* 14.6* 9.3  NEUTROABS 15.9*  --   --   HGB 13.3 10.5* 10.9*  HCT 41.6 33.3* 34.3*  MCV 74.6* 72.9* 73.8*  PLT 294 238 232   Cardiac Enzymes: No results for input(s): CKTOTAL, CKMB, CKMBINDEX, TROPONINI in the last 168 hours. BNP: BNP (last 3 results) No results for input(s): BNP in the last 8760 hours.  ProBNP (last 3 results) No results for input(s): PROBNP in the last 8760 hours.  CBG: No results for input(s): GLUCAP in the last 168 hours.     SignedEdsel Petrin  [facility] 07/09/2016, 11:05 AM

## 2016-07-09 NOTE — Discharge Instructions (Signed)
Pyelonephritis, Adult Pyelonephritis is a kidney infection. The kidneys are the organs that filter a person's blood and move waste out of the bloodstream and into the urine. Urine passes from the kidneys, through the ureters, and into the bladder. There are two main types of pyelonephritis:  Infections that come on quickly without any warning (acute pyelonephritis).  Infections that last for a long period of time (chronic pyelonephritis). In most cases, the infection clears up with treatment and does not cause further problems. More severe infections or chronic infections can sometimes spread to the bloodstream or lead to other problems with the kidneys. What are the causes? This condition is usually caused by:  Bacteria traveling from the bladder to the kidney through infected urine. The urine in the bladder can become infected with bacteria from:  Bladder infection (cystitis).  Inflammation of the prostate gland (prostatitis).  Sexual intercourse, in females.  Bacteria traveling from the bloodstream to the kidney. What increases the risk? This condition is more likely to develop in:  Pregnant women.  Older people.  People who have diabetes.  People who have kidney stones or bladder stones.  People who have other abnormalities of the kidney or ureter.  People who have a catheter placed in the bladder.  People who have cancer.  People who are sexually active.  Women who use spermicides.  People who have had a prior urinary tract infection. What are the signs or symptoms? Symptoms of this condition include:  Frequent urination.  Strong or persistent urge to urinate.  Burning or stinging when urinating.  Abdominal pain.  Back pain.  Pain in the side or flank area.  Fever.  Chills.  Blood in the urine, or dark urine.  Nausea.  Vomiting. How is this diagnosed? This condition may be diagnosed based on:  Medical history and physical exam.  Urine  tests.  Blood tests. You may also have imaging tests of the kidneys, such as an ultrasound or CT scan. How is this treated? Treatment for this condition may depend on the severity of the infection.  If the infection is mild and is found early, you may be treated with antibiotic medicines taken by mouth. You will need to drink fluids to remain hydrated.  If the infection is more severe, you may need to stay in the hospital and receive antibiotics given directly into a vein through an IV tube. You may also need to receive fluids through an IV tube if you are not able to remain hydrated. After your hospital stay, you may need to take oral antibiotics for a period of time. Other treatments may be required, depending on the cause of the infection. Follow these instructions at home: Medicines   Take over-the-counter and prescription medicines only as told by your health care provider.  If you were prescribed an antibiotic medicine, take it as told by your health care provider. Do not stop taking the antibiotic even if you start to feel better. General instructions   Drink enough fluid to keep your urine clear or pale yellow.  Avoid caffeine, tea, and carbonated beverages. They tend to irritate the bladder.  Urinate often. Avoid holding in urine for long periods of time.  Urinate before and after sex.  After a bowel movement, women should cleanse from front to back. Use each tissue only once.  Keep all follow-up visits as told by your health care provider. This is important. Contact a health care provider if:  Your symptoms do not get better   after 2 days of treatment.  Your symptoms get worse.  You have a fever. Get help right away if:  You are unable to take your antibiotics or fluids.  You have shaking chills.  You vomit.  You have severe flank or back pain.  You have extreme weakness or fainting. This information is not intended to replace advice given to you by your  health care provider. Make sure you discuss any questions you have with your health care provider. Document Released: 03/09/2005 Document Revised: 08/15/2015 Document Reviewed: 07/02/2014 Elsevier Interactive Patient Education  2017 Elsevier Inc.  

## 2016-07-11 LAB — CULTURE, BLOOD (ROUTINE X 2)
CULTURE: NO GROWTH
Culture: NO GROWTH
SPECIAL REQUESTS: ADEQUATE
Special Requests: ADEQUATE

## 2016-07-13 ENCOUNTER — Encounter: Payer: Self-pay | Admitting: *Deleted

## 2016-07-13 ENCOUNTER — Other Ambulatory Visit: Payer: Self-pay | Admitting: *Deleted

## 2016-07-13 DIAGNOSIS — I10 Essential (primary) hypertension: Secondary | ICD-10-CM | POA: Diagnosis not present

## 2016-07-13 DIAGNOSIS — E119 Type 2 diabetes mellitus without complications: Secondary | ICD-10-CM | POA: Diagnosis not present

## 2016-07-13 DIAGNOSIS — F320 Major depressive disorder, single episode, mild: Secondary | ICD-10-CM | POA: Diagnosis not present

## 2016-07-13 DIAGNOSIS — N10 Acute tubulo-interstitial nephritis: Secondary | ICD-10-CM | POA: Diagnosis not present

## 2016-07-13 NOTE — Patient Outreach (Signed)
[facility] Care Management  07/13/2016  Samantha Molina 1961-02-12 [id number]  Subjective: Telephone call to patient's home / mobile number, spoke with patient, and HIPAA verified.   Discussed [facility] Care Management UMR Transition of care follow up, patient voiced understanding, and is in agreement to complete follow up. Patient states she is feeling much better, still weak, had follow up appointment with primary MD today, and has a blood pressure cuff to take daily readings.   States she is wanting to see nutritionist and was wondering if she needed a referral.   RNCM educated patient on the [employer]al Counseling benefit through the [facility] [phone]), advised no referral needed, and provided her with contact number.  Patient voices understanding and states she will call to make an appointment.   States she exercises daily through [employer] and is aware that she will need her activity slowly as tolerated.  States she dropped her hospital indemnity supplemental insurance for 2018 and will look into resuming it next year.  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 Patient states she is wanting to lose about 20 pounds and requested information on healthy eating.  Patient in agreement to receive EMMI educational video: NUTRITION AND HEALTHY EATING and would like to receive via email address ([name]).  Patient states she does not have any transition of care, care coordination, disease management, disease monitoring, transportation, community resource, or pharmacy needs at this time.  States she is very appreciative of the follow up and is in agreement to receive [facility] Care Management information.    Objective: Per chart review, patient hospitalized 07/06/16 -07/09/16 for sepsis secondary to Acute pyelonephritis.   Patient also has a history of hypertension and Impaired glucose tolerance.      Assessment: Received UMR Transition of care referral on 07/07/16.   Transition of care follow up completed, no care management needs, and will proceed with case closure.    RNCM will send patient successful outreach letter, [facility] pamphlet,  Magnet, and EMMI educational video ( NUTRITION AND HEALTHY EATING).    RNCM will send case closure due to follow up completed / no care management needs request to Iverson Alamin at [facility] Care Management.   Loetta Connelley H. Gardiner Barefoot, BSN, CCM [facility] Care Management [facility] Telephonic CM Phone: [phone] Fax: [phone]

## 2016-07-21 MED FILL — ALPRAZolam 0.25 MG TABS: 0.25 | 30 days supply | Qty: 30 | Fill #0

## 2016-08-11 ENCOUNTER — Other Ambulatory Visit: Payer: Self-pay | Admitting: *Deleted

## 2016-08-11 NOTE — Patient Outreach (Addendum)
[facility] Care Management  08/11/2016  Samantha Molina 1960-10-05 [id number]   Subjective: Received voicemail from patient request call back regarding assistance with family medical leave act (FMLA). Telephone call to patient's home  / mobile number, no answer, left HIPAA compliant voicemail message, and requested call back.  Objective: Per chart review, patient hospitalized 07/06/16 -07/09/16 for sepsis secondary to Acute pyelonephritis.   Patient also has a history of hypertension and Impaired glucose tolerance.   [facility] Care [facility]age[facility] Transition of care follow up completed on 07/13/16.    Assessment: [facility] 08/11/16 [facility] patient. [facility] pending patient contact.    Plan: RNCM will call patient for 2nd telephone outreach attempt, [facility] Consult  follow up, within 10 business days if no return call. RNCM will send case status update request to [facility]Laura [facility].    Samantha Mesta H. Gardiner Barefootooper RN, BSN, CCM [facility] Care [facility] Telephonic CM Phone: [phone] Fax: [phone]

## 2016-08-12 ENCOUNTER — Encounter: Payer: Self-pay | Admitting: *Deleted

## 2016-08-12 ENCOUNTER — Other Ambulatory Visit: Payer: Self-pay | Admitting: *Deleted

## 2016-08-12 MED FILL — AMLODIPINE BESYLATE 10 MG T: 10 | 90 days supply | Qty: 90 | Fill #1

## 2016-08-12 NOTE — Patient Outreach (Addendum)
[facility]) Care Management  08/12/2016  [name] [birth year] [id number]  Subjective: Telephone call to patient's home / mobile number, spoke with patient, and HIPAA verified.  [facility]  follow up, patient voiced understanding, and is in agreement to follow up.   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   Objective:Per chart review, patient hospitalized 07/06/16 -07/09/16 for sepsis secondary to Acute pyelonephritis. Patient also has a history of hypertension and Impaired glucose tolerance.  [facility] Transition of care follow up completed on 07/13/16.    Assessment:Received [facility] Consult referral on 08/11/16 from patient. [facility] Consult follow up completed, no care management needs, and will proceed with case closure.    Plan: RNCM will send patient successful outreach letter, [facility] pamph[facility], and magnet. RNCM will send case closure due to follow up completed / no care management needs request to Iverson AlaminLaura Greeson at [facility] Care [facility].   Eleri Ruben H. Gardiner Barefootooper RN, BSN, CCM [facility] Telephonic CM Phone: [phone] Fax: [phone]

## 2016-08-20 DIAGNOSIS — M5416 Radiculopathy, lumbar region: Secondary | ICD-10-CM | POA: Diagnosis not present

## 2016-08-20 DIAGNOSIS — G5603 Carpal tunnel syndrome, bilateral upper limbs: Secondary | ICD-10-CM | POA: Diagnosis not present

## 2016-08-20 DIAGNOSIS — G603 Idiopathic progressive neuropathy: Secondary | ICD-10-CM | POA: Diagnosis not present

## 2016-08-20 DIAGNOSIS — M542 Cervicalgia: Secondary | ICD-10-CM | POA: Diagnosis not present

## 2016-08-20 DIAGNOSIS — G43009 Migraine without aura, not intractable, without status migrainosus: Secondary | ICD-10-CM | POA: Diagnosis not present

## 2016-08-20 DIAGNOSIS — R200 Anesthesia of skin: Secondary | ICD-10-CM | POA: Diagnosis not present

## 2016-08-20 DIAGNOSIS — M5412 Radiculopathy, cervical region: Secondary | ICD-10-CM | POA: Diagnosis not present

## 2016-08-20 MED FILL — TOPIRAMATE 100 MG TABLET: 100 | 30 days supply | Qty: 60 | Fill #0

## 2016-08-25 DIAGNOSIS — G4700 Insomnia, unspecified: Secondary | ICD-10-CM | POA: Diagnosis not present

## 2016-08-25 DIAGNOSIS — I10 Essential (primary) hypertension: Secondary | ICD-10-CM | POA: Diagnosis not present

## 2016-08-25 DIAGNOSIS — Z6834 Body mass index (BMI) 34.0-34.9, adult: Secondary | ICD-10-CM | POA: Diagnosis not present

## 2016-08-25 DIAGNOSIS — E119 Type 2 diabetes mellitus without complications: Secondary | ICD-10-CM | POA: Diagnosis not present

## 2016-08-25 DIAGNOSIS — F320 Major depressive disorder, single episode, mild: Secondary | ICD-10-CM | POA: Diagnosis not present

## 2016-08-25 MED FILL — ZOLPIDEM TARTRATE 10 MG TAB: 10 | 90 days supply | Qty: 90 | Fill #0

## 2016-08-25 MED FILL — ALPRAZolam 0.25 MG TABS: 0.25 | 30 days supply | Qty: 30 | Fill #0

## 2016-09-16 DIAGNOSIS — G5603 Carpal tunnel syndrome, bilateral upper limbs: Secondary | ICD-10-CM | POA: Diagnosis not present

## 2016-09-16 DIAGNOSIS — G43009 Migraine without aura, not intractable, without status migrainosus: Secondary | ICD-10-CM | POA: Diagnosis not present

## 2016-09-16 DIAGNOSIS — M5412 Radiculopathy, cervical region: Secondary | ICD-10-CM | POA: Diagnosis not present

## 2016-09-16 DIAGNOSIS — M5417 Radiculopathy, lumbosacral region: Secondary | ICD-10-CM | POA: Diagnosis not present

## 2016-09-21 MED FILL — LISINOPRIL-HCTZ 20-25 MG TA: 20-25 | 90 days supply | Qty: 90 | Fill #0

## 2016-09-28 MED FILL — DESVENLAFAXINE SUC ER 50 MG: 50 | 90 days supply | Qty: 90 | Fill #0

## 2016-09-28 MED FILL — TOPIRAMATE 100 MG TABLET: 100 | 30 days supply | Qty: 60 | Fill #1

## 2016-10-01 MED FILL — ALPRAZolam 0.25 MG TABS: 0.25 | 30 days supply | Qty: 30 | Fill #1

## 2016-10-02 MED FILL — OXYCODONE-ACETAMINOPHEN 10-: 10-325 | 30 days supply | Qty: 90 | Fill #0

## 2016-10-19 ENCOUNTER — Other Ambulatory Visit: Payer: Self-pay | Admitting: Family Medicine

## 2016-10-19 DIAGNOSIS — Z1231 Encounter for screening mammogram for malignant neoplasm of breast: Secondary | ICD-10-CM

## 2016-10-28 DIAGNOSIS — G43009 Migraine without aura, not intractable, without status migrainosus: Secondary | ICD-10-CM | POA: Diagnosis not present

## 2016-10-28 DIAGNOSIS — M5417 Radiculopathy, lumbosacral region: Secondary | ICD-10-CM | POA: Diagnosis not present

## 2016-10-28 DIAGNOSIS — G5603 Carpal tunnel syndrome, bilateral upper limbs: Secondary | ICD-10-CM | POA: Diagnosis not present

## 2016-10-28 DIAGNOSIS — M5412 Radiculopathy, cervical region: Secondary | ICD-10-CM | POA: Diagnosis not present

## 2016-10-28 DIAGNOSIS — G629 Polyneuropathy, unspecified: Secondary | ICD-10-CM | POA: Diagnosis not present

## 2016-11-02 MED FILL — OXYCODONE-ACETAMINOPHEN 10-: 10-325 | 30 days supply | Qty: 90 | Fill #0

## 2016-11-02 MED FILL — TOPIRAMATE 100 MG TABLET: 100 | 30 days supply | Qty: 60 | Fill #2

## 2016-11-09 DIAGNOSIS — N390 Urinary tract infection, site not specified: Secondary | ICD-10-CM | POA: Diagnosis not present

## 2016-11-09 MED FILL — CEPHALEXIN 500 MG CAPSULE: 500 | 7 days supply | Qty: 14 | Fill #0

## 2016-11-11 ENCOUNTER — Ambulatory Visit: Payer: 59

## 2016-11-16 MED FILL — ALPRAZolam 0.25 MG TABS: 0.25 | 30 days supply | Qty: 30 | Fill #2

## 2016-11-16 MED FILL — AMLODIPINE BESYLATE 10 MG T: 10 | 90 days supply | Qty: 90 | Fill #0

## 2016-11-30 MED FILL — OXYCODONE-ACETAMINOPHEN 10-: 10-325 | 30 days supply | Qty: 90 | Fill #0

## 2016-12-01 DIAGNOSIS — N390 Urinary tract infection, site not specified: Secondary | ICD-10-CM | POA: Diagnosis not present

## 2016-12-01 DIAGNOSIS — I10 Essential (primary) hypertension: Secondary | ICD-10-CM | POA: Diagnosis not present

## 2016-12-01 DIAGNOSIS — E119 Type 2 diabetes mellitus without complications: Secondary | ICD-10-CM | POA: Diagnosis not present

## 2016-12-01 DIAGNOSIS — R079 Chest pain, unspecified: Secondary | ICD-10-CM | POA: Diagnosis not present

## 2016-12-01 MED FILL — SULFAMETHOXAZOLE/TMP DS TAB: 800-160 | 7 days supply | Qty: 14 | Fill #0

## 2016-12-28 MED FILL — TOPIRAMATE 100 MG TABLET: 100 | 30 days supply | Qty: 60 | Fill #3

## 2016-12-28 MED FILL — LISINOPRIL-HCTZ 20-25 MG TA: 20-25 | 90 days supply | Qty: 90 | Fill #1

## 2016-12-29 MED FILL — OXYCODONE-ACETAMINOPHEN 10-: 10-325 | 30 days supply | Qty: 90 | Fill #0

## 2017-01-04 MED FILL — DESVENLAFAXINE SUC ER 50 MG: 50 | 90 days supply | Qty: 90 | Fill #1

## 2017-01-05 MED FILL — ALPRAZolam 0.25 MG TABS: 0.25 | 30 days supply | Qty: 30 | Fill #0

## 2017-01-06 DIAGNOSIS — J209 Acute bronchitis, unspecified: Secondary | ICD-10-CM | POA: Diagnosis not present

## 2017-01-06 DIAGNOSIS — R05 Cough: Secondary | ICD-10-CM | POA: Diagnosis not present

## 2017-01-07 MED FILL — BENZONATATE 100 MG CAPS: 100 | 7 days supply | Qty: 40 | Fill #0

## 2017-01-07 MED FILL — AZITHROMYCIN 250 MG TABLET: 250 | 5 days supply | Qty: 6 | Fill #0

## 2017-01-15 ENCOUNTER — Ambulatory Visit
Admission: RE | Admit: 2017-01-15 | Discharge: 2017-01-15 | Disposition: A | Discharge status: Home / Self Care | Payer: 59 | Source: Ambulatory Visit | Attending: Family Medicine | Admitting: Family Medicine

## 2017-01-15 DIAGNOSIS — Z1231 Encounter for screening mammogram for malignant neoplasm of breast: Secondary | ICD-10-CM | POA: Diagnosis not present

## 2017-01-27 MED FILL — OXYCODONE-ACETAMINOPHEN 10-: 10-325 | 30 days supply | Qty: 90 | Fill #0

## 2017-02-03 DIAGNOSIS — N390 Urinary tract infection, site not specified: Secondary | ICD-10-CM | POA: Diagnosis not present

## 2017-02-03 DIAGNOSIS — G5602 Carpal tunnel syndrome, left upper limb: Secondary | ICD-10-CM | POA: Diagnosis not present

## 2017-02-03 DIAGNOSIS — G629 Polyneuropathy, unspecified: Secondary | ICD-10-CM | POA: Diagnosis not present

## 2017-02-03 DIAGNOSIS — N393 Stress incontinence (female) (male): Secondary | ICD-10-CM | POA: Diagnosis not present

## 2017-02-03 DIAGNOSIS — R310 Gross hematuria: Secondary | ICD-10-CM | POA: Diagnosis not present

## 2017-02-03 DIAGNOSIS — M5412 Radiculopathy, cervical region: Secondary | ICD-10-CM | POA: Diagnosis not present

## 2017-02-03 DIAGNOSIS — R350 Frequency of micturition: Secondary | ICD-10-CM | POA: Diagnosis not present

## 2017-02-03 DIAGNOSIS — M5417 Radiculopathy, lumbosacral region: Secondary | ICD-10-CM | POA: Diagnosis not present

## 2017-02-03 DIAGNOSIS — M542 Cervicalgia: Secondary | ICD-10-CM | POA: Diagnosis not present

## 2017-02-03 DIAGNOSIS — R351 Nocturia: Secondary | ICD-10-CM | POA: Diagnosis not present

## 2017-02-08 MED FILL — SULFAMETHOXAZOLE/TMP DS TAB: 800-160 | 7 days supply | Qty: 14 | Fill #0

## 2017-02-15 MED FILL — AMLODIPINE BESYLATE 10 MG T: 10 | 90 days supply | Qty: 90 | Fill #1

## 2017-02-15 MED FILL — ALPRAZolam 0.25 MG TABS: 0.25 | 30 days supply | Qty: 30 | Fill #0

## 2017-02-24 DIAGNOSIS — F320 Major depressive disorder, single episode, mild: Secondary | ICD-10-CM | POA: Diagnosis not present

## 2017-02-24 DIAGNOSIS — G4700 Insomnia, unspecified: Secondary | ICD-10-CM | POA: Diagnosis not present

## 2017-02-24 DIAGNOSIS — I10 Essential (primary) hypertension: Secondary | ICD-10-CM | POA: Diagnosis not present

## 2017-02-24 DIAGNOSIS — E119 Type 2 diabetes mellitus without complications: Secondary | ICD-10-CM | POA: Diagnosis not present

## 2017-02-24 MED FILL — ZOLPIDEM TARTRATE 10 MG TAB: 10 | 90 days supply | Qty: 90 | Fill #0

## 2017-02-25 DIAGNOSIS — N200 Calculus of kidney: Secondary | ICD-10-CM | POA: Diagnosis not present

## 2017-02-25 DIAGNOSIS — R350 Frequency of micturition: Secondary | ICD-10-CM | POA: Diagnosis not present

## 2017-02-25 DIAGNOSIS — R310 Gross hematuria: Secondary | ICD-10-CM | POA: Diagnosis not present

## 2017-02-25 DIAGNOSIS — N390 Urinary tract infection, site not specified: Secondary | ICD-10-CM | POA: Diagnosis not present

## 2017-02-25 MED FILL — CEPHALEXIN 250 MG CAPSULE: 250 | 30 days supply | Qty: 30 | Fill #0

## 2017-02-26 MED FILL — OXYCODONE-ACETAMINOPHEN 10-: 10-325 | 30 days supply | Qty: 90 | Fill #0

## 2017-03-19 MED FILL — TOPIRAMATE 100 MG TABLET: 100 | 30 days supply | Qty: 60 | Fill #0

## 2017-03-22 MED FILL — ALPRAZolam 0.25 MG TABS: 0.25 | 30 days supply | Qty: 30 | Fill #0

## 2017-03-26 MED FILL — OXYCODONE-ACETAMINOPHEN 10-: 10-325 | 30 days supply | Qty: 90 | Fill #0

## 2017-03-31 MED FILL — CEPHALEXIN 250 MG CAPSULE: 250 | 30 days supply | Qty: 30 | Fill #1

## 2017-04-08 DIAGNOSIS — R310 Gross hematuria: Secondary | ICD-10-CM | POA: Diagnosis not present

## 2017-04-13 MED FILL — DESVENLAFAXINE SUC ER 50 MG: 50 | 90 days supply | Qty: 90 | Fill #0

## 2017-04-13 MED FILL — LISINOPRIL-HCTZ 20-25 MG TA: 20-25 | 90 days supply | Qty: 90 | Fill #0

## 2017-04-17 DIAGNOSIS — R05 Cough: Secondary | ICD-10-CM | POA: Diagnosis not present

## 2017-04-17 DIAGNOSIS — J208 Acute bronchitis due to other specified organisms: Secondary | ICD-10-CM | POA: Diagnosis not present

## 2017-04-17 DIAGNOSIS — J101 Influenza due to other identified influenza virus with other respiratory manifestations: Secondary | ICD-10-CM | POA: Diagnosis not present

## 2017-04-20 MED FILL — TOPIRAMATE 100 MG TABLET: 100 | 30 days supply | Qty: 60 | Fill #1

## 2017-04-26 MED FILL — ALPRAZolam 0.25 MG TABS: 0.25 | 30 days supply | Qty: 30 | Fill #1

## 2017-04-26 MED FILL — OXYCODONE-ACETAMINOPHEN 10-: 10-325 | 30 days supply | Qty: 90 | Fill #0

## 2017-05-10 MED FILL — CEPHALEXIN 250 MG CAPSULE: 250 | 30 days supply | Qty: 30 | Fill #2

## 2017-05-24 MED FILL — AMLODIPINE BESYLATE 10 MG T: 10 | 90 days supply | Qty: 90 | Fill #0

## 2017-05-24 MED FILL — TOPIRAMATE 100 MG TABLET: 100 | 30 days supply | Qty: 60 | Fill #2

## 2017-05-26 MED FILL — OXYCODONE-ACETAMINOPHEN 10-: 10-325 | 30 days supply | Qty: 90 | Fill #0

## 2017-05-26 MED FILL — ALPRAZolam 0.25 MG TABS: 0.25 | 30 days supply | Qty: 30 | Fill #0

## 2017-06-02 DIAGNOSIS — M5417 Radiculopathy, lumbosacral region: Secondary | ICD-10-CM | POA: Diagnosis not present

## 2017-06-02 DIAGNOSIS — G5601 Carpal tunnel syndrome, right upper limb: Secondary | ICD-10-CM | POA: Diagnosis not present

## 2017-06-02 DIAGNOSIS — G43009 Migraine without aura, not intractable, without status migrainosus: Secondary | ICD-10-CM | POA: Diagnosis not present

## 2017-06-02 DIAGNOSIS — M5412 Radiculopathy, cervical region: Secondary | ICD-10-CM | POA: Diagnosis not present

## 2017-06-02 DIAGNOSIS — M542 Cervicalgia: Secondary | ICD-10-CM | POA: Diagnosis not present

## 2017-06-17 MED FILL — CEPHALEXIN 250 MG CAPSULE: 250 | 30 days supply | Qty: 30 | Fill #3

## 2017-06-24 MED FILL — TOPIRAMATE 100 MG TABLET: 100 | 30 days supply | Qty: 60 | Fill #3

## 2017-06-24 MED FILL — OXYCODONE-ACETAMINOPHEN 10-: 10-325 | 30 days supply | Qty: 90 | Fill #0

## 2017-07-12 MED FILL — LISINOPRIL-HCTZ 20-25 MG TA: 20-25 | 90 days supply | Qty: 90 | Fill #1

## 2017-07-12 MED FILL — ALPRAZolam 0.25 MG TABS: 0.25 | 30 days supply | Qty: 30 | Fill #0

## 2017-07-12 MED FILL — DESVENLAFAXINE SUC ER 50 MG: 50 | 90 days supply | Qty: 90 | Fill #1

## 2017-07-21 DIAGNOSIS — M5417 Radiculopathy, lumbosacral region: Secondary | ICD-10-CM | POA: Diagnosis not present

## 2017-07-21 DIAGNOSIS — G5603 Carpal tunnel syndrome, bilateral upper limbs: Secondary | ICD-10-CM | POA: Diagnosis not present

## 2017-07-21 DIAGNOSIS — Z79899 Other long term (current) drug therapy: Secondary | ICD-10-CM | POA: Diagnosis not present

## 2017-07-21 DIAGNOSIS — R200 Anesthesia of skin: Secondary | ICD-10-CM | POA: Diagnosis not present

## 2017-07-21 DIAGNOSIS — G43009 Migraine without aura, not intractable, without status migrainosus: Secondary | ICD-10-CM | POA: Diagnosis not present

## 2017-07-21 DIAGNOSIS — M5412 Radiculopathy, cervical region: Secondary | ICD-10-CM | POA: Diagnosis not present

## 2017-07-21 DIAGNOSIS — G629 Polyneuropathy, unspecified: Secondary | ICD-10-CM | POA: Diagnosis not present

## 2017-07-21 DIAGNOSIS — G5610 Other lesions of median nerve, unspecified upper limb: Secondary | ICD-10-CM | POA: Diagnosis not present

## 2017-07-21 MED FILL — RIZATRIPTAN 10 MG ODT: 10 | 30 days supply | Qty: 9 | Fill #0

## 2017-07-26 MED FILL — OXYCODONE-ACETAMINOPHEN 10-: 10-325 | 30 days supply | Qty: 90 | Fill #0

## 2017-08-04 ENCOUNTER — Encounter: Payer: Self-pay | Admitting: Obstetrics and Gynecology

## 2017-08-04 ENCOUNTER — Ambulatory Visit (INDEPENDENT_AMBULATORY_CARE_PROVIDER_SITE_OTHER): Payer: 59 | Admitting: Obstetrics and Gynecology

## 2017-08-04 ENCOUNTER — Other Ambulatory Visit: Payer: Self-pay

## 2017-08-04 VITALS — BP 110/70 | HR 84 | Resp 16 | Ht 64.0 in | Wt 208.0 lb

## 2017-08-04 DIAGNOSIS — N3941 Urge incontinence: Secondary | ICD-10-CM

## 2017-08-04 DIAGNOSIS — F418 Other specified anxiety disorders: Secondary | ICD-10-CM

## 2017-08-04 DIAGNOSIS — Z01419 Encounter for gynecological examination (general) (routine) without abnormal findings: Secondary | ICD-10-CM | POA: Diagnosis not present

## 2017-08-04 DIAGNOSIS — N3281 Overactive bladder: Secondary | ICD-10-CM | POA: Diagnosis not present

## 2017-08-04 DIAGNOSIS — Z1211 Encounter for screening for malignant neoplasm of colon: Secondary | ICD-10-CM | POA: Diagnosis not present

## 2017-08-04 DIAGNOSIS — B372 Candidiasis of skin and nail: Secondary | ICD-10-CM | POA: Diagnosis not present

## 2017-08-04 MED ORDER — NYSTATIN 100000 UNIT/GM EX CREA: 1.0000 "application " | TOPICAL_CREAM | Freq: Two times a day (BID) | CUTANEOUS | 0 refills | Status: DC

## 2017-08-04 MED ORDER — OXYBUTYNIN CHLORIDE ER 5 MG PO TB24: 5.0000 mg | ORAL_TABLET | Freq: Every day | ORAL | 1 refills | Status: DC

## 2017-08-04 MED FILL — CEPHALEXIN 250 MG CAPSULE: 250 | 30 days supply | Qty: 30 | Fill #4

## 2017-08-04 MED FILL — OXYBUTYNIN CL ER 5 MG TAB: 5 | 30 days supply | Qty: 30 | Fill #0

## 2017-08-04 MED FILL — NYSTATIN 100,000 UNIT/GM CR: 100000 | 7 days supply | Qty: 30 | Fill #0

## 2017-08-04 NOTE — Progress Notes (Signed)
57 y.o. [id number] MarriedAfrican AmericanF here for annual exam. H/O TVH.    She c/o vulvar pruritus off and on for a long time. She does get sweaty in that area.  She leaks urine on the way to the bathroom, worse at night. Leaks about every other day/night, small amount. Drinks a lot of water. On a diuretic, no caffeine. Not a change.  She has been evaluated by Urology, has f/u next month. Has had a negative w/u.  Not sexually active, secondary to ED.     No LMP recorded. Patient has had a hysterectomy.          Sexually active: No.  The current method of family planning is status post hysterectomy.    Exercising: Yes.    spin/zumba/water works/ treadmill  Smoker:  no  Health Maintenance: Pap:  unsure History of abnormal Pap:  no MMG:  01-15-17 WNL  Colonoscopy:  08/27/10, f/u due in 5 years.  BMD:   Never TDaP:  Up to date  Gardasil: N/A   reports that she has never smoked. She has never used smokeless tobacco. She reports that she drinks about 0.5 oz of alcohol per week. She reports that she does not use drugs. Son is 40, h/o lymphoma. Other son is 7. She has 12 grand children and one great grandchild. Works as a Network engineer at [employer].   Past Medical History:  Diagnosis Date  . Arthritis   . Arthritis    both knees  . Chronic leg pain   . Depression   . Diabetes mellitus without complication ([place])   . Hypertension   . Migraine without aura   . Morbid obesity ([place])   . Urinary incontinence   . Vitamin D deficiency disease 2013  Diet controlled diabetes.   Past Surgical History:  Procedure Laterality Date  . KNEE ARTHROSCOPY W/ ACL RECONSTRUCTION Left 2004  . LAPAROSCOPIC CHOLECYSTECTOMY  2005  . LAPAROSCOPIC GASTRIC BANDING  2007  . TONSILLECTOMY  1984   Age 61  . TOTAL VAGINAL HYSTERECTOMY  age 72   Ovaries remain, for menorrhagia  . TUBAL LIGATION      Current Outpatient Medications  Medication Sig Dispense Refill  . ALPRAZolam (XANAX) 0.25 MG tablet Take  0.25 mg by mouth at bedtime as needed for anxiety.    Marland Kitchen amLODipine (NORVASC) 10 MG tablet Take 10 mg by mouth daily.  1  . cephALEXin (KEFLEX) 250 MG capsule Take 250 mg by mouth at bedtime.  5  . Cholecalciferol (VITAMIN D) 2000 UNITS tablet Take 2,000 Units by mouth daily.      Marland Kitchen desvenlafaxine (PRISTIQ) 50 MG 24 hr tablet Take 50 mg by mouth daily.      Marland Kitchen lisinopril-hydrochlorothiazide (PRINZIDE,ZESTORETIC) 20-25 MG per tablet Take 1 tablet by mouth daily.      . Multiple Vitamins-Minerals (MULTIVITAMIN WITH MINERALS) tablet Take 1 tablet by mouth daily.      . ondansetron (ZOFRAN) 4 MG tablet Take 1 tablet (4 mg total) by mouth every 8 (eight) hours as needed for nausea or vomiting. 20 tablet 0  . oxyCODONE-acetaminophen (PERCOCET) 10-325 MG tablet Take 1 tablet by mouth every 8 (eight) hours as needed.  0  . topiramate (TOPAMAX) 100 MG tablet Take 100 mg by mouth 2 (two) times daily.  4  . zolpidem (AMBIEN) 10 MG tablet Take 10 mg by mouth at bedtime as needed for sleep.     . Omega-3 Fatty Acids (FISH OIL) 1000 MG CAPS Take by  mouth.     No current facility-administered medications for this visit.   On Oxycodone for arthritis in her knees  Family History  Problem Relation Age of Onset  . Hypertension Mother   . Rheum arthritis Mother        Lupus  . Heart disease Mother   . Lupus Mother   . Diabetes Father   . Hypertension Father   . Stroke Father   . Hyperlipidemia Father   . Heart disease Father   . Lupus Brother   . Diabetes Brother   . Hyperlipidemia Brother   . Breast cancer Maternal Aunt 64       X 2, one in her 38's  . Lupus Maternal Aunt   . Breast cancer Cousin 59       maternal & paternal cousin  . Breast cancer Paternal Aunt 62       died in her 35's  . Lymphoma Son   Maudry Diego was negative for BRCA testing.   Review of Systems  Constitutional: Negative.   HENT: Negative.   Eyes: Negative.   Respiratory: Negative.   Cardiovascular: Negative.    Gastrointestinal: Negative.   Endocrine: Negative.   Genitourinary:       Vaginal itching   Musculoskeletal: Negative.   Skin: Negative.   Allergic/Immunologic: Negative.   Neurological: Negative.   Psychiatric/Behavioral: Negative.     Exam:   BP 110/70 (BP Location: Right Arm, Patient Position: Sitting, Cuff Size: Normal)   Pulse 84   Resp 16   Ht _0  (1.626 m)   Wt 208 lb (94.3 kg)   BMI 35.70 kg/m   Weight change: _1 @ Height:   Height: _2  (162.6 cm)  Ht Readings from Last 3 Encounters:  08/04/17 _3  (1.626 m)  07/06/16 _4  (1.702 m)  01/28/16 _5  (1.676 m)    General appearance: alert, cooperative and appears stated age Head: Normocephalic, without obvious abnormality, atraumatic Neck: no adenopathy, supple, symmetrical, trachea midline and thyroid normal to inspection and palpation Lungs: clear to auscultation bilaterally Cardiovascular: regular rate and rhythm Breasts: normal appearance, no masses or tenderness Abdomen: soft, non-tender; non distended,  no masses,  no organomegaly Extremities: extremities normal, atraumatic, no cyanosis or edema Skin: increased pigmentation bilateral groin. Normal texture, turgor normal. No rashes or lesions Lymph nodes: Cervical, supraclavicular, and axillary nodes normal. No abnormal inguinal nodes palpated Neurologic: Grossly normal   Pelvic: External genitalia:  no lesions              Urethra:  normal appearing urethra with no masses, tenderness or lesions              Bartholins and Skenes: normal                 Vagina: normal appearing vagina with normal color and discharge, no lesions              Cervix: absent               Bimanual Exam:  Uterus:  uterus absent              Adnexa: no mass, fullness, tenderness               Rectovaginal: Confirms               Anus:  normal sphincter tone, no lesions  Chaperone was present for exam.  A:  Well Woman with normal exam  Groin irritation,  suspect  candida intertrigo  Overactive bladder. Long term issue. She is on a diuretic  Family history of breast cancer  Depression/anxiety, helped with medication  P:   No pap  Treat groin with nystatin  Discussed vulvar skin care  Start ditropan, f/u in one month  Discussed seeing a genetic counselor, she will let me know if she wants to do this  Discussed breast self exam  Discussed calcium and vit D intake  Yearly 3D mammograms  Colonoscopy referral placed  Names of counselors given

## 2017-08-04 NOTE — Patient Instructions (Signed)
EXERCISE AND DIET:  We recommended that you start or continue a regular exercise program for good health. Regular exercise means any activity that makes your heart beat faster and makes you sweat.  We recommend exercising at least 30 minutes per day at least 3 days a week, preferably 4 or 5.  We also recommend a diet low in fat and sugar.  Inactivity, poor dietary choices and obesity can cause diabetes, heart attack, stroke, and kidney damage, among others.    ALCOHOL AND SMOKING:  Women should limit their alcohol intake to no more than 7 drinks/beers/glasses of wine (combined, not each!) per week. Moderation of alcohol intake to this level decreases your risk of breast cancer and liver damage. And of course, no recreational drugs are part of a healthy lifestyle.  And absolutely no smoking or even second hand smoke. Most people know smoking can cause heart and lung diseases, but did you know it also contributes to weakening of your bones? Aging of your skin?  Yellowing of your teeth and nails?  CALCIUM AND VITAMIN D:  Adequate intake of calcium and Vitamin D are recommended.  The recommendations for exact amounts of these supplements seem to change often, but generally speaking 600 mg of calcium (either carbonate or citrate) and 800 units of Vitamin D per day seems prudent. Certain women may benefit from higher intake of Vitamin D.  If you are among these women, your doctor will have told you during your visit.    PAP SMEARS:  Pap smears, to check for cervical cancer or precancers,  have traditionally been done yearly, although recent scientific advances have shown that most women can have pap smears less often.  However, every woman still should have a physical exam from her gynecologist every year. It will include a breast check, inspection of the vulva and vagina to check for abnormal growths or skin changes, a visual exam of the cervix, and then an exam to evaluate the size and shape of the uterus and  ovaries.  And after 57 years of age, a rectal exam is indicated to check for rectal cancers. We will also provide age appropriate advice regarding health maintenance, like when you should have certain vaccines, screening for sexually transmitted diseases, bone density testing, colonoscopy, mammograms, etc.   MAMMOGRAMS:  All women over 40 years old should have a yearly mammogram. Many facilities now offer a "3D" mammogram, which may cost around $50 extra out of pocket. If possible,  we recommend you accept the option to have the 3D mammogram performed.  It both reduces the number of women who will be called back for extra views which then turn out to be normal, and it is better than the routine mammogram at detecting truly abnormal areas.    COLONOSCOPY:  Colonoscopy to screen for colon cancer is recommended for all women at age 50.  We know, you hate the idea of the prep.  We agree, BUT, having colon cancer and not knowing it is worse!!  Colon cancer so often starts as a polyp that can be seen and removed at colonscopy, which can quite literally save your life!  And if your first colonoscopy is normal and you have no family history of colon cancer, most women don't have to have it again for 10 years.  Once every ten years, you can do something that may end up saving your life, right?  We will be happy to help you get it scheduled when you are ready.    Be sure to check your insurance coverage so you understand how much it will cost.  It may be covered as a preventative service at no cost, but you should check your particular policy.      Breast Self-Awareness Breast self-awareness means being familiar with how your breasts look and feel. It involves checking your breasts regularly and reporting any changes to your health care provider. Practicing breast self-awareness is important. A change in your breasts can be a sign of a serious medical problem. Being familiar with how your breasts look and feel allows  you to find any problems early, when treatment is more likely to be successful. All women should practice breast self-awareness, including women who have had breast implants. How to do a breast self-exam One way to learn what is normal for your breasts and whether your breasts are changing is to do a breast self-exam. To do a breast self-exam: Look for Changes  1. Remove all the clothing above your waist. 2. Stand in front of a mirror in a room with good lighting. 3. Put your hands on your hips. 4. Push your hands firmly downward. 5. Compare your breasts in the mirror. Look for differences between them (asymmetry), such as: ? Differences in shape. ? Differences in size. ? Puckers, dips, and bumps in one breast and not the other. 6. Look at each breast for changes in your skin, such as: ? Redness. ? Scaly areas. 7. Look for changes in your nipples, such as: ? Discharge. ? Bleeding. ? Dimpling. ? Redness. ? A change in position. Feel for Changes  Carefully feel your breasts for lumps and changes. It is best to do this while lying on your back on the floor and again while sitting or standing in the shower or tub with soapy water on your skin. Feel each breast in the following way:  Place the arm on the side of the breast you are examining above your head.  Feel your breast with the other hand.  Start in the nipple area and make  inch (2 cm) overlapping circles to feel your breast. Use the pads of your three middle fingers to do this. Apply light pressure, then medium pressure, then firm pressure. The light pressure will allow you to feel the tissue closest to the skin. The medium pressure will allow you to feel the tissue that is a little deeper. The firm pressure will allow you to feel the tissue close to the ribs.  Continue the overlapping circles, moving downward over the breast until you feel your ribs below your breast.  Move one finger-width toward the center of the body.  Continue to use the  inch (2 cm) overlapping circles to feel your breast as you move slowly up toward your collarbone.  Continue the up and down exam using all three pressures until you reach your armpit.  Write Down What You Find  Write down what is normal for each breast and any changes that you find. Keep a written record with breast changes or normal findings for each breast. By writing this information down, you do not need to depend only on memory for size, tenderness, or location. Write down where you are in your menstrual cycle, if you are still menstruating. If you are having trouble noticing differences in your breasts, do not get discouraged. With time you will become more familiar with the variations in your breasts and more comfortable with the exam. How often should I examine my breasts? Examine   your breasts every month. If you are breastfeeding, the best time to examine your breasts is after a feeding or after using a breast pump. If you menstruate, the best time to examine your breasts is 5-7 days after your period is over. During your period, your breasts are lumpier, and it may be more difficult to notice changes. When should I see my health care provider? See your health care provider if you notice:  A change in shape or size of your breasts or nipples.  A change in the skin of your breast or nipples, such as a reddened or scaly area.  Unusual discharge from your nipples.  A lump or thick area that was not there before.  Pain in your breasts.  Anything that concerns you.  This information is not intended to replace advice given to you by your health care provider. Make sure you discuss any questions you have with your health care provider. Document Released: 03/09/2005 Document Revised: 08/15/2015 Document Reviewed: 01/27/2015 Elsevier Interactive Patient Education  2018 [employer] Inc[employer]  

## 2017-08-09 DIAGNOSIS — H5213 Myopia, bilateral: Secondary | ICD-10-CM | POA: Diagnosis not present

## 2017-08-24 MED FILL — OXYCODONE-ACETAMINOPHEN 10-: 10-325 | 30 days supply | Qty: 90 | Fill #0

## 2017-08-24 MED FILL — TOPIRAMATE 100 MG TABLET: 100 | 30 days supply | Qty: 60 | Fill #0

## 2017-08-25 DIAGNOSIS — G4700 Insomnia, unspecified: Secondary | ICD-10-CM | POA: Diagnosis not present

## 2017-08-25 DIAGNOSIS — I10 Essential (primary) hypertension: Secondary | ICD-10-CM | POA: Diagnosis not present

## 2017-08-25 DIAGNOSIS — E119 Type 2 diabetes mellitus without complications: Secondary | ICD-10-CM | POA: Diagnosis not present

## 2017-08-25 DIAGNOSIS — F320 Major depressive disorder, single episode, mild: Secondary | ICD-10-CM | POA: Diagnosis not present

## 2017-08-25 MED FILL — AMLODIPINE BESYLATE 10 MG T: 10 | 90 days supply | Qty: 90 | Fill #0

## 2017-08-25 MED FILL — ALPRAZolam 0.25 MG TABS: 0.25 | 30 days supply | Qty: 30 | Fill #0

## 2017-08-25 MED FILL — ZOLPIDEM TARTRATE 10 MG TAB: 10 | 90 days supply | Qty: 90 | Fill #0

## 2017-09-06 ENCOUNTER — Other Ambulatory Visit: Payer: Self-pay

## 2017-09-06 ENCOUNTER — Ambulatory Visit (INDEPENDENT_AMBULATORY_CARE_PROVIDER_SITE_OTHER): Payer: 59 | Admitting: Obstetrics and Gynecology

## 2017-09-06 ENCOUNTER — Encounter: Payer: Self-pay | Admitting: Obstetrics and Gynecology

## 2017-09-06 VITALS — BP 124/78 | HR 72 | Resp 16 | Wt 212.0 lb

## 2017-09-06 DIAGNOSIS — N3281 Overactive bladder: Secondary | ICD-10-CM | POA: Diagnosis not present

## 2017-09-06 DIAGNOSIS — N3941 Urge incontinence: Secondary | ICD-10-CM

## 2017-09-06 DIAGNOSIS — F4321 Adjustment disorder with depressed mood: Secondary | ICD-10-CM | POA: Diagnosis not present

## 2017-09-06 MED ORDER — OXYBUTYNIN CHLORIDE ER 10 MG PO TB24: 10.0000 mg | ORAL_TABLET | Freq: Every day | ORAL | 1 refills | Status: DC

## 2017-09-06 MED FILL — OXYBUTYNIN CL ER 10 MG TAB: 10 | 30 days supply | Qty: 30 | Fill #0

## 2017-09-06 NOTE — Progress Notes (Signed)
GYNECOLOGY  VISIT   HPI: 57 y.o.   Married  [place]   [phone] with No LMP recorded. Patient has had a hysterectomy.   here for follow up on OAB. Last month she was started on ditropan, it is helping. She was needing a depend at night prior to the ditropan and was getting up 8 x at night and at least 4 x had to change her underwear. Now she is up 2 x a night, not leaking. Day time leakage has improved, she does wearing a mini-pad, not needing to change it. Prior to the ditropan she was changing it 3 x a day. She drinks a lot of fluid, no worsening of dry mouth or constipation on the ditropan.    She is feeling down, her best friend died last week. She has good support, plans to make an appointment with a counselor. She is on antidepressants.   GYNECOLOGIC HISTORY: No LMP recorded. Patient has had a hysterectomy. Contraception:hysterectomy  Menopausal hormone therapy: none         OB History    Gravida  3   Para  2   Term  2   Preterm      AB  1   Living  2     SAB      TAB      Ectopic      Multiple      Live Births  2              Patient Active Problem List   Diagnosis Date Noted  . Acute pyelonephritis 07/07/2016  . Sepsis ([facility]) 07/06/2016  . HTN (hypertension) 07/06/2016  . Cystitis with hematuria 07/06/2016  . Lapband APL in [place] 2007 09/21/2013    Past Medical History:  Diagnosis Date  . Arthritis   . Arthritis    both knees  . Chronic leg pain   . Depression   . Diabetes mellitus without complication ([facility])   . Hypertension   . Migraine without aura   . Morbid obesity ([facility])   . Urinary incontinence   . Vitamin D deficiency disease 2013    Past Surgical History:  Procedure Laterality Date  . KNEE ARTHROSCOPY W/ ACL RECONSTRUCTION Left 2004  . LAPAROSCOPIC CHOLECYSTECTOMY  2005  . LAPAROSCOPIC GASTRIC BANDING  2007  . TONSILLECTOMY  1984   Age 57  . TOTAL VAGINAL HYSTERECTOMY  age 57   Ovaries remain, for menorrhagia  .  TUBAL LIGATION      Current Outpatient Medications  Medication Sig Dispense Refill  . ALPRAZolam (XANAX) 0.25 MG tablet Take 0.25 mg by mouth at bedtime as needed for anxiety.    Marland Kitchen. amLODipine (NORVASC) 10 MG tablet Take 10 mg by mouth daily.  1  . cephALEXin (KEFLEX) 250 MG capsule Take 250 mg by mouth at bedtime.  5  . Cholecalciferol (VITAMIN D) 2000 UNITS tablet Take 2,000 Units by mouth daily.      Marland Kitchen. desvenlafaxine (PRISTIQ) 50 MG 24 hr tablet Take 50 mg by mouth daily.      Marland Kitchen. lisinopril-hydrochlorothiazide (PRINZIDE,ZESTORETIC) 20-25 MG per tablet Take 1 tablet by mouth daily.      . Multiple Vitamins-Minerals (MULTIVITAMIN WITH MINERALS) tablet Take 1 tablet by mouth daily.      Marland Kitchen. nystatin cream (MYCOSTATIN) Apply 1 application topically 2 (two) times daily. Apply to affected area BID for up to 7 days. 30 g 0  . Omega-3 Fatty Acids (FISH OIL) 1000 MG CAPS  Take by mouth.    . ondansetron (ZOFRAN) 4 MG tablet Take 1 tablet (4 mg total) by mouth every 8 (eight) hours as needed for nausea or vomiting. 20 tablet 0  . oxybutynin (DITROPAN-XL) 5 MG 24 hr tablet Take 1 tablet (5 mg total) by mouth at bedtime. 30 tablet 1  . oxyCODONE-acetaminophen (PERCOCET) 10-325 MG tablet Take 1 tablet by mouth every 8 (eight) hours as needed.  0  . topiramate (TOPAMAX) 100 MG tablet Take 100 mg by mouth 2 (two) times daily.  4  . zolpidem (AMBIEN) 10 MG tablet Take 10 mg by mouth at bedtime as needed for sleep.      No current facility-administered medications for this visit.      ALLERGIES: Patient has no known allergies.  Family History  Problem Relation Age of Onset  . Hypertension Mother   . Rheum arthritis Mother        Lupus  . Heart disease Mother   . Lupus Mother   . Diabetes Father   . Hypertension Father   . Stroke Father   . Hyperlipidemia Father   . Heart disease Father   . Lupus Brother   . Diabetes Brother   . Hyperlipidemia Brother   . Breast cancer Maternal Aunt 64       X  2, one in her 59's  . Lupus Maternal Aunt   . Breast cancer Cousin 71       maternal & paternal cousin  . Breast cancer Paternal Aunt 30       died in her 64's  . Lymphoma Son     Social History   Socioeconomic History  . Marital status: Married    Spouse name: Not on file  . Number of children: Not on file  . Years of education: Not on file  . Highest education level: Not on file  Occupational History  . Not on file  Social Needs  . Financial resource strain: Not on file  . Food insecurity:    Worry: Not on file    Inability: Not on file  . Transportation needs:    Medical: Not on file    Non-medical: Not on file  Tobacco Use  . Smoking status: Never Smoker  . Smokeless tobacco: Never Used  Substance and Sexual Activity  . Alcohol use: Yes    Alcohol/week: 0.6 oz    Types: 1 Standard drinks or equivalent per week    Comment: rarely  . Drug use: No  . Sexual activity: Not Currently    Partners: Male    Birth control/protection: Surgical    Comment: TVH  Lifestyle  . Physical activity:    Days per week: Not on file    Minutes per session: Not on file  . Stress: Not on file  Relationships  . Social connections:    Talks on phone: Not on file    Gets together: Not on file    Attends religious service: Not on file    Active member of club or organization: Not on file    Attends meetings of clubs or organizations: Not on file    Relationship status: Not on file  . Intimate partner violence:    Fear of current or ex partner: Not on file    Emotionally abused: Not on file    Physically abused: Not on file    Forced sexual activity: Not on file  Other Topics Concern  . Not on file  Social History  Narrative  . Not on file    Review of Systems  Constitutional: Negative.   HENT: Negative.   Eyes: Negative.   Respiratory: Negative.   Cardiovascular: Negative.   Gastrointestinal: Negative.   Genitourinary: Negative.   Musculoskeletal: Negative.   Skin:  Negative.   Neurological: Negative.   Endo/Heme/Allergies: Negative.   Psychiatric/Behavioral: Negative.     PHYSICAL EXAMINATION:    BP 124/78 (BP Location: Right Arm, Patient Position: Sitting, Cuff Size: Normal)   Pulse 72   Resp 16   Wt 212 lb (96.2 kg)   BMI 36.39 kg/m     General appearance: alert, cooperative and appears stated age  ASSESSMENT OAB, much better on the ditropan, still with some leakage. Tolerable side effects.  Depression worsened with the death of her best friend last week   PLAN Increase her ditropan to 10 gm F/U in one month She is going to make an appointment to see a counselor I told her to speak with her primary about adjusting her antidepressant as needed   An After Visit Summary was printed and given to the patient.

## 2017-09-08 DIAGNOSIS — G43009 Migraine without aura, not intractable, without status migrainosus: Secondary | ICD-10-CM | POA: Diagnosis not present

## 2017-09-08 DIAGNOSIS — M5417 Radiculopathy, lumbosacral region: Secondary | ICD-10-CM | POA: Diagnosis not present

## 2017-09-08 DIAGNOSIS — M542 Cervicalgia: Secondary | ICD-10-CM | POA: Diagnosis not present

## 2017-09-08 DIAGNOSIS — R260 Ataxic gait: Secondary | ICD-10-CM | POA: Diagnosis not present

## 2017-09-09 DIAGNOSIS — R351 Nocturia: Secondary | ICD-10-CM | POA: Diagnosis not present

## 2017-09-09 DIAGNOSIS — R310 Gross hematuria: Secondary | ICD-10-CM | POA: Diagnosis not present

## 2017-09-09 MED FILL — CEPHALEXIN 250 MG CAPSULE: 250 | 30 days supply | Qty: 30 | Fill #0

## 2017-09-21 MED FILL — OXYCODONE-ACETAMINOPHEN 10-: 10-325 | 30 days supply | Qty: 90 | Fill #0

## 2017-09-24 MED FILL — ALPRAZolam 0.25 MG TABS: 0.25 | 30 days supply | Qty: 30 | Fill #1

## 2017-09-27 MED FILL — TOPIRAMATE 100 MG TABLET: 100 | 30 days supply | Qty: 60 | Fill #1

## 2017-10-05 ENCOUNTER — Telehealth: Payer: Self-pay | Admitting: Obstetrics and Gynecology

## 2017-10-05 IMAGING — CR DG CHEST 2V
2 series · 2 of 2 positions shown · non-contrast
Comparison: 10/23/2013 .

CLINICAL DATA: Fever.  Weakness.

EXAM:
CHEST  2 VIEW

[w chest pa]
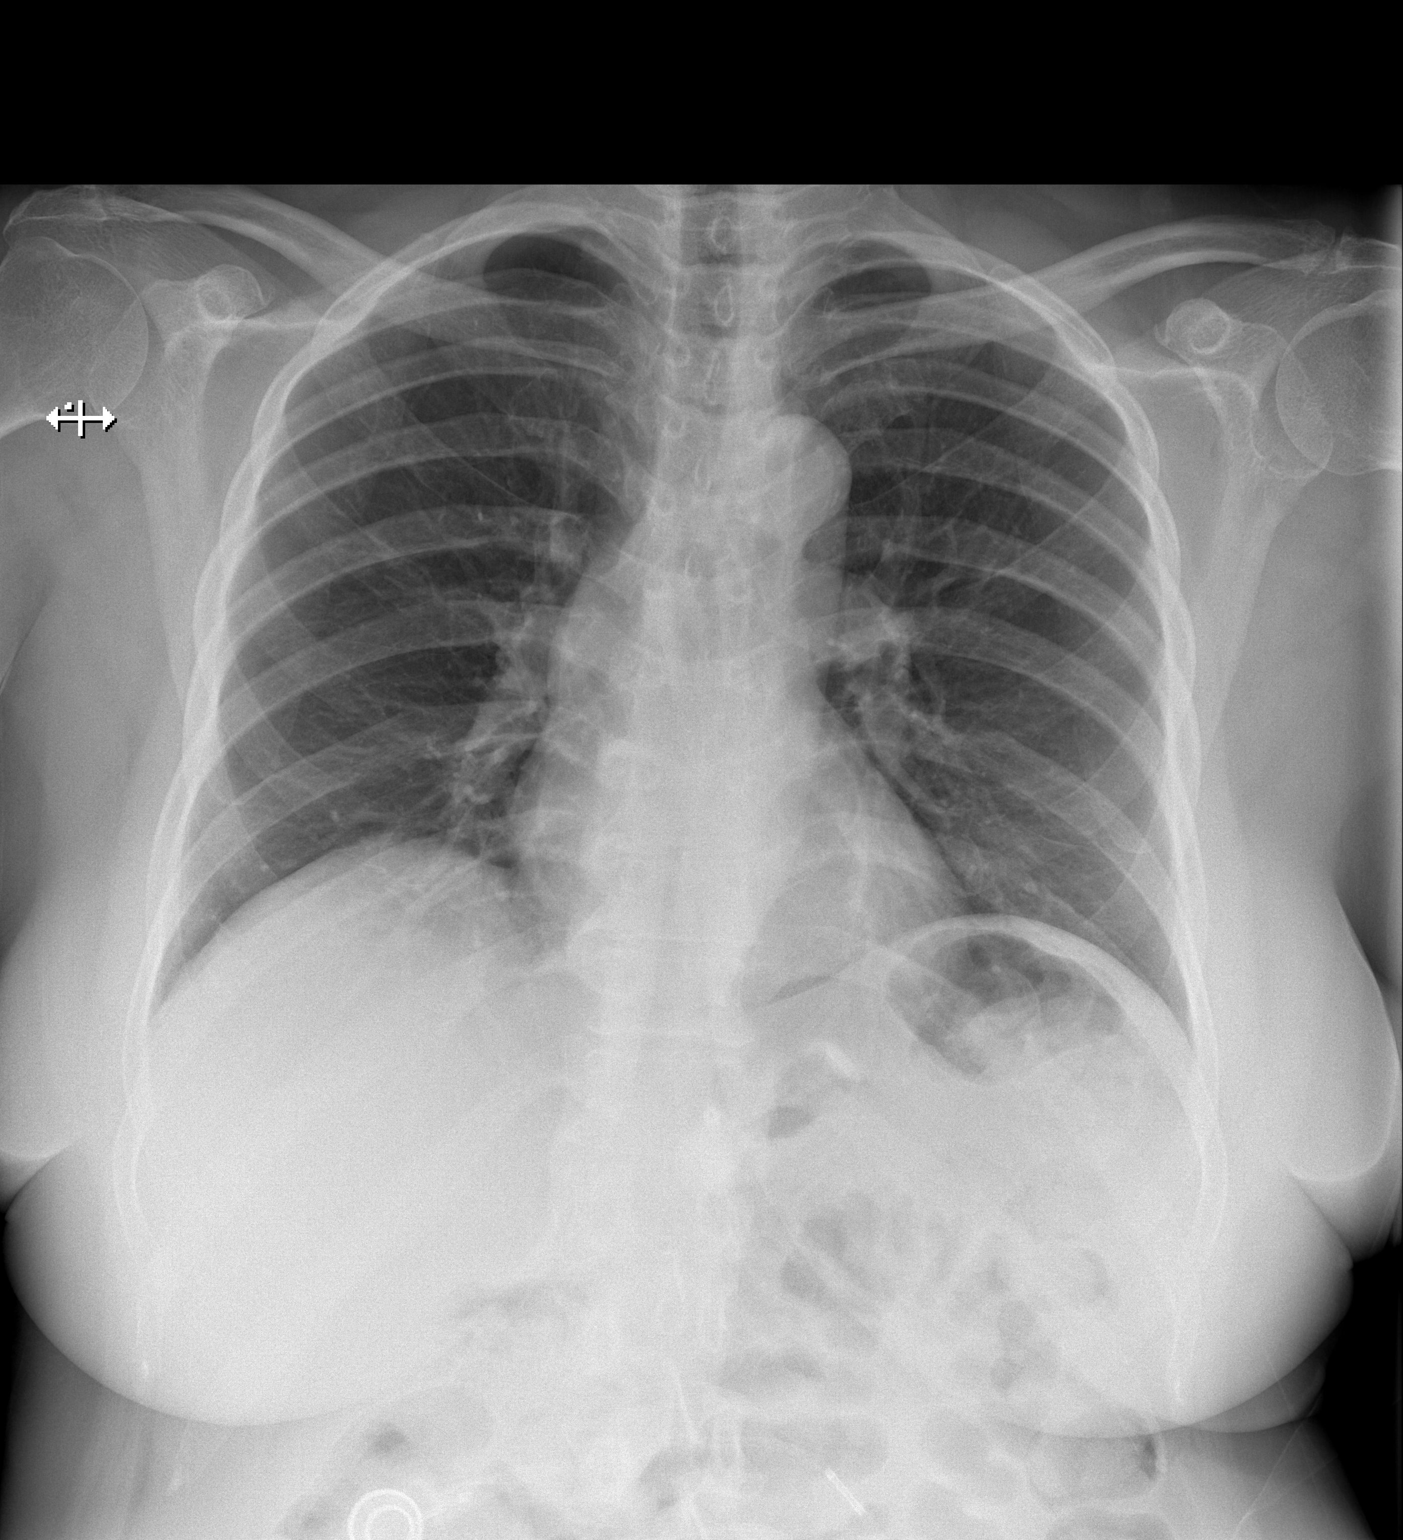

[w chest lat]
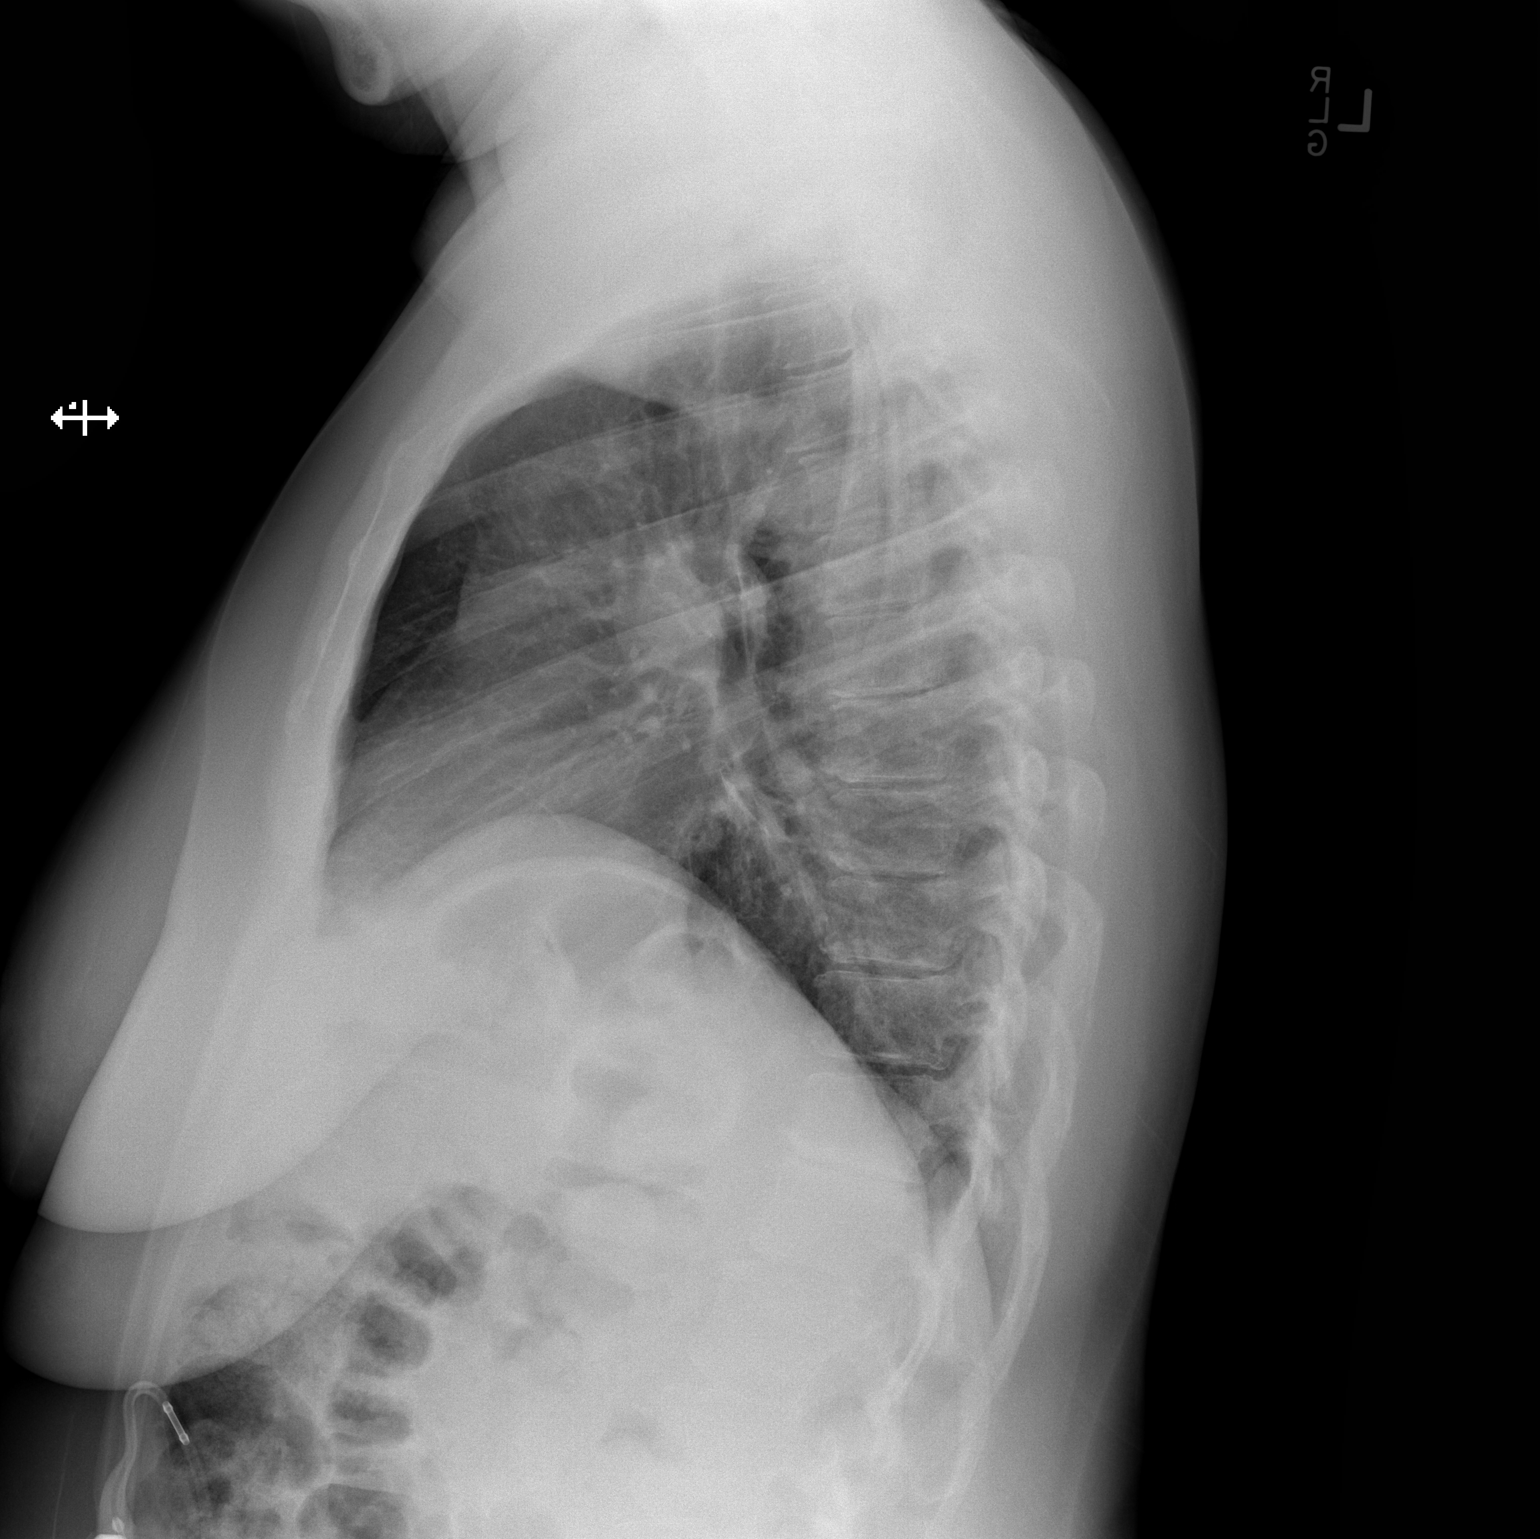

[2 of 2 positions shown; findings below may reference images not displayed]

FINDINGS: Mediastinum and hilar structures normal. Lungs are clear. Heart size
normal. No pleural effusion or pneumothorax. Mild degenerative
change thoracic spine. Prior lap band surgery. Lap band appears in
stable position
IMPRESSION: 1. Stable chest.  No acute cardiopulmonary disease.

2. Prior lap band surgery. Lap band appears to be in stable
position.

## 2017-10-05 NOTE — Telephone Encounter (Signed)
Patient canceled her 1 month recheck appointment tomorrow and will call later to reschedule.

## 2017-10-05 NOTE — Progress Notes (Deleted)
GYNECOLOGY  VISIT   HPI: 57 y.o.   Married  [place]  female   [phone] with No LMP recorded. Patient has had a hysterectomy.   here for 1 month follow up on Ditropan.  GYNECOLOGIC HISTORY: No LMP recorded. Patient has had a hysterectomy. Contraception: Hysterectomy Menopausal hormone therapy: None        OB History    Gravida  3   Para  2   Term  2   Preterm      AB  1   Living  2     SAB      TAB      Ectopic      Multiple      Live Births  2              Patient Active Problem List   Diagnosis Date Noted  . Acute pyelonephritis 07/07/2016  . Sepsis (HCC) 07/06/2016  . HTN (hypertension) 07/06/2016  . Cystitis with hematuria 07/06/2016  . Lapband APL in [place] 2007 09/21/2013    Past Medical History:  Diagnosis Date  . Arthritis   . Arthritis    both knees  . Chronic leg pain   . Depression   . Diabetes mellitus without complication (HCC)   . Hypertension   . Migraine without aura   . Morbid obesity (HCC)   . Urinary incontinence   . Vitamin D deficiency disease 2013    Past Surgical History:  Procedure Laterality Date  . KNEE ARTHROSCOPY W/ ACL RECONSTRUCTION Left 2004  . LAPAROSCOPIC CHOLECYSTECTOMY  2005  . LAPAROSCOPIC GASTRIC BANDING  2007  . TONSILLECTOMY  1984   Age 88  . TOTAL VAGINAL HYSTERECTOMY  age 79   Ovaries remain, for menorrhagia  . TUBAL LIGATION      Current Outpatient Medications  Medication Sig Dispense Refill  . ALPRAZolam (XANAX) 0.25 MG tablet Take 0.25 mg by mouth at bedtime as needed for anxiety.    Marland Kitchen amLODipine (NORVASC) 10 MG tablet Take 10 mg by mouth daily.  1  . cephALEXin (KEFLEX) 250 MG capsule Take 250 mg by mouth at bedtime.  5  . Cholecalciferol (VITAMIN D) 2000 UNITS tablet Take 2,000 Units by mouth daily.      Marland Kitchen desvenlafaxine (PRISTIQ) 50 MG 24 hr tablet Take 50 mg by mouth daily.      Marland Kitchen lisinopril-hydrochlorothiazide (PRINZIDE,ZESTORETIC) 20-25 MG per tablet Take 1 tablet by mouth daily.       . Multiple Vitamins-Minerals (MULTIVITAMIN WITH MINERALS) tablet Take 1 tablet by mouth daily.      Marland Kitchen nystatin cream (MYCOSTATIN) Apply 1 application topically 2 (two) times daily. Apply to affected area BID for up to 7 days. 30 g 0  . Omega-3 Fatty Acids (FISH OIL) 1000 MG CAPS Take by mouth.    . ondansetron (ZOFRAN) 4 MG tablet Take 1 tablet (4 mg total) by mouth every 8 (eight) hours as needed for nausea or vomiting. 20 tablet 0  . oxybutynin (DITROPAN XL) 10 MG 24 hr tablet Take 1 tablet (10 mg total) by mouth at bedtime. 30 tablet 1  . oxyCODONE-acetaminophen (PERCOCET) 10-325 MG tablet Take 1 tablet by mouth every 8 (eight) hours as needed.  0  . topiramate (TOPAMAX) 100 MG tablet Take 100 mg by mouth 2 (two) times daily.  4  . zolpidem (AMBIEN) 10 MG tablet Take 10 mg by mouth at bedtime as needed for sleep.      No current facility-administered medications  for this visit.      ALLERGIES: Patient has no known allergies.  Family History  Problem Relation Age of Onset  . Hypertension Mother   . Rheum arthritis Mother        Lupus  . Heart disease Mother   . Lupus Mother   . Diabetes Father   . Hypertension Father   . Stroke Father   . Hyperlipidemia Father   . Heart disease Father   . Lupus Brother   . Diabetes Brother   . Hyperlipidemia Brother   . Breast cancer Maternal Aunt 64       X 2, one in her 950's  . Lupus Maternal Aunt   . Breast cancer Cousin 1742       maternal & paternal cousin  . Breast cancer Paternal Aunt 30       died in her 4540's  . Lymphoma Son     Social History   Socioeconomic History  . Marital status: Married    Spouse name: Not on file  . Number of children: Not on file  . Years of education: Not on file  . Highest education level: Not on file  Occupational History  . Not on file  Social Needs  . Financial resource strain: Not on file  . Food insecurity:    Worry: Not on file    Inability: Not on file  . Transportation needs:     Medical: Not on file    Non-medical: Not on file  Tobacco Use  . Smoking status: Never Smoker  . Smokeless tobacco: Never Used  Substance and Sexual Activity  . Alcohol use: Yes    Alcohol/week: 0.6 oz    Types: 1 Standard drinks or equivalent per week    Comment: rarely  . Drug use: No  . Sexual activity: Not Currently    Partners: Male    Birth control/protection: Surgical    Comment: TVH  Lifestyle  . Physical activity:    Days per week: Not on file    Minutes per session: Not on file  . Stress: Not on file  Relationships  . Social connections:    Talks on phone: Not on file    Gets together: Not on file    Attends religious service: Not on file    Active member of club or organization: Not on file    Attends meetings of clubs or organizations: Not on file    Relationship status: Not on file  . Intimate partner violence:    Fear of current or ex partner: Not on file    Emotionally abused: Not on file    Physically abused: Not on file    Forced sexual activity: Not on file  Other Topics Concern  . Not on file  Social History Narrative  . Not on file    ROS  PHYSICAL EXAMINATION:    There were no vitals taken for this visit.    General appearance: alert, cooperative and appears stated age Neck: no adenopathy, supple, symmetrical, trachea midline and thyroid {CHL AMB PHY EX THYROID NORM DEFAULT:[phone]::"normal to inspection and palpation"} Breasts: {Exam; breast:13139::"normal appearance, no masses or tenderness"} Abdomen: soft, non-tender; non distended, no masses,  no organomegaly  Pelvic: External genitalia:  no lesions              Urethra:  normal appearing urethra with no masses, tenderness or lesions              Bartholins and Skenes:  normal                 Vagina: normal appearing vagina with normal color and discharge, no lesions              Cervix: {CHL AMB PHY EX CERVIX NORM [phone]::"no lesions"}              Bimanual Exam:  Uterus:   {CHL AMB PHY EX UTERUS NORM DEFAULT:[phone]::"normal size, contour, position, consistency, mobility, non-tender"}              Adnexa: {CHL AMB PHY EX ADNEXA NO MASS DEFAULT:[phone]::"no mass, fullness, tenderness"}              Rectovaginal: {yes no:[id number]}.  Confirms.              Anus:  normal sphincter tone, no lesions  Chaperone was present for exam.  ASSESSMENT     PLAN    An After Visit Summary was printed and given to the patient.  *** minutes face to face time of which over 50% was spent in counseling.

## 2017-10-06 ENCOUNTER — Ambulatory Visit: Payer: 59 | Admitting: Obstetrics and Gynecology

## 2017-10-11 MED FILL — LISINOPRIL-HCTZ 20-25 MG TA: 20-25 | 90 days supply | Qty: 90 | Fill #0

## 2017-10-11 MED FILL — DESVENLAFAXINE SUC ER 50 MG: 50 | 90 days supply | Qty: 90 | Fill #0

## 2017-10-19 MED FILL — CEPHALEXIN 250 MG CAPSULE: 250 | 30 days supply | Qty: 30 | Fill #1

## 2017-10-19 MED FILL — OXYCODONE-ACETAMINOPHEN 10-: 10-325 | 30 days supply | Qty: 90 | Fill #0

## 2017-10-19 MED FILL — OXYBUTYNIN CL ER 10 MG TAB: 10 | 30 days supply | Qty: 30 | Fill #1

## 2017-10-26 ENCOUNTER — Ambulatory Visit (INDEPENDENT_AMBULATORY_CARE_PROVIDER_SITE_OTHER): Payer: 59 | Admitting: Licensed Clinical Social Worker

## 2017-10-26 DIAGNOSIS — F331 Major depressive disorder, recurrent, moderate: Secondary | ICD-10-CM

## 2017-10-29 MED FILL — ALPRAZolam 0.25 MG TABS: 0.25 | 30 days supply | Qty: 30 | Fill #2

## 2017-11-11 MED FILL — TOPIRAMATE 100 MG TABLET: 100 | 30 days supply | Qty: 60 | Fill #2

## 2017-11-16 MED FILL — OXYCODONE-ACETAMINOPHEN 10-: 10-325 | 30 days supply | Qty: 90 | Fill #0

## 2017-11-24 ENCOUNTER — Ambulatory Visit: Payer: 59 | Admitting: Licensed Clinical Social Worker

## 2017-11-24 ENCOUNTER — Ambulatory Visit (INDEPENDENT_AMBULATORY_CARE_PROVIDER_SITE_OTHER): Payer: 59 | Admitting: Licensed Clinical Social Worker

## 2017-11-24 DIAGNOSIS — F3341 Major depressive disorder, recurrent, in partial remission: Secondary | ICD-10-CM

## 2017-11-25 ENCOUNTER — Other Ambulatory Visit: Payer: Self-pay | Admitting: Obstetrics and Gynecology

## 2017-11-25 MED FILL — CEPHALEXIN 250 MG CAPSULE: 250 | 30 days supply | Qty: 30 | Fill #2

## 2017-11-25 MED FILL — OXYBUTYNIN CL ER 10 MG TAB: 10 | 30 days supply | Qty: 30 | Fill #0

## 2017-11-25 NOTE — Telephone Encounter (Signed)
Medication refill request: Ditropan  10mg   Last AEX:  08/04/17 Next AEX: 12/29/17 Last MMG (if hormonal medication request): 01/15/17 Bi-rads category 1 neg  Refill authorized: Please refill until AEX if appropriate.

## 2017-11-25 NOTE — Telephone Encounter (Signed)
Please have the patient make a f/u appointment to determine if she is on the correct dose of ditropan.  One month refill sent

## 2017-11-26 MED FILL — AMLODIPINE BESYLATE 10 MG T: 10 | 90 days supply | Qty: 90 | Fill #1

## 2017-11-26 NOTE — Telephone Encounter (Signed)
Spoke with patient. Patient is scheduled for f/u on 10/9 at 11am with Dr. Reyne Dumas. Patient verbalizes understanding and is agreeable. Encounter closed.

## 2017-11-26 NOTE — Telephone Encounter (Signed)
Left detailed message, ok per dpr. Advised as seen below per Dr. Oscar La. Please return call to office at [phone] to schedule f/u appt.

## 2017-11-30 MED FILL — traZODone HCL 50 MG TABS: 50 | 90 days supply | Qty: 90 | Fill #0

## 2017-11-30 MED FILL — ALPRAZolam 0.25 MG TABS: 0.25 | 30 days supply | Qty: 30 | Fill #3

## 2017-12-13 MED FILL — TOPIRAMATE 100 MG TABLET: 100 | 30 days supply | Qty: 60 | Fill #3

## 2017-12-15 DIAGNOSIS — M5412 Radiculopathy, cervical region: Secondary | ICD-10-CM | POA: Diagnosis not present

## 2017-12-15 DIAGNOSIS — M5417 Radiculopathy, lumbosacral region: Secondary | ICD-10-CM | POA: Diagnosis not present

## 2017-12-15 DIAGNOSIS — G43009 Migraine without aura, not intractable, without status migrainosus: Secondary | ICD-10-CM | POA: Diagnosis not present

## 2017-12-15 DIAGNOSIS — G629 Polyneuropathy, unspecified: Secondary | ICD-10-CM | POA: Diagnosis not present

## 2017-12-16 MED FILL — HYDROCODON-APAP 10-325: 10-325 | 30 days supply | Qty: 90 | Fill #0

## 2017-12-22 ENCOUNTER — Ambulatory Visit: Payer: 59 | Admitting: Licensed Clinical Social Worker

## 2017-12-27 NOTE — Progress Notes (Signed)
GYNECOLOGY  VISIT   HPI: 57 y.o.   Married Black or [place] American Not Hispanic or Latino  female   [phone] with No LMP recorded. Patient has had a hysterectomy.   here for follow up on Ditropan. Reports not getting up as much at night to urinate as she was prior to starting Ditropan. Patient still has urgency, but reports she is drinking a lot of water throughout the day. She is taking ditropan 10 mg, up from 5 mg.  She is now up 3 x a night vs 10+, not leaking at night, not needing to change her underwear (or wear depends like she used to). Voiding normally during the day, not having the urgency.  She drinks a lot of water, void normal amounts.  She has slight dry mouth and constipation, but tolerable.  She is seeing a counselor for depression. Doing better.   GYNECOLOGIC HISTORY: No LMP recorded. Patient has had a hysterectomy. Contraception: Hysterectomy Menopausal hormone therapy: None        OB History    Gravida  3   Para  2   Term  2   Preterm      AB  1   Living  2     SAB      TAB      Ectopic      Multiple      Live Births  2              Patient Active Problem List   Diagnosis Date Noted  . Acute pyelonephritis 07/07/2016  . Sepsis (HCC) 07/06/2016  . HTN (hypertension) 07/06/2016  . Cystitis with hematuria 07/06/2016  . Lapband APL in [place] 2007 09/21/2013    Past Medical History:  Diagnosis Date  . Arthritis   . Arthritis    both knees  . Chronic leg pain   . Depression   . Diabetes mellitus without complication (HCC)   . Hypertension   . Migraine without aura   . Morbid obesity (HCC)   . Urinary incontinence   . Vitamin D deficiency disease 2013    Past Surgical History:  Procedure Laterality Date  . KNEE ARTHROSCOPY W/ ACL RECONSTRUCTION Left 2004  . LAPAROSCOPIC CHOLECYSTECTOMY  2005  . LAPAROSCOPIC GASTRIC BANDING  2007  . TONSILLECTOMY  1984   Age 6  . TOTAL VAGINAL HYSTERECTOMY  age 29   Ovaries remain, for menorrhagia   . TUBAL LIGATION      Current Outpatient Medications  Medication Sig Dispense Refill  . ALPRAZolam (XANAX) 0.25 MG tablet Take 0.25 mg by mouth at bedtime as needed for anxiety.    Marland Kitchen amLODipine (NORVASC) 10 MG tablet Take 10 mg by mouth daily.  1  . cephALEXin (KEFLEX) 250 MG capsule Take 250 mg by mouth at bedtime.  5  . Cholecalciferol (VITAMIN D) 2000 UNITS tablet Take 2,000 Units by mouth daily.      Marland Kitchen desvenlafaxine (PRISTIQ) 50 MG 24 hr tablet Take 50 mg by mouth daily.      Marland Kitchen lisinopril-hydrochlorothiazide (PRINZIDE,ZESTORETIC) 20-25 MG per tablet Take 1 tablet by mouth daily.      . Multiple Vitamins-Minerals (MULTIVITAMIN WITH MINERALS) tablet Take 1 tablet by mouth daily.      Marland Kitchen nystatin cream (MYCOSTATIN) Apply 1 application topically 2 (two) times daily. Apply to affected area BID for up to 7 days. 30 g 0  . Omega-3 Fatty Acids (FISH OIL) 1000 MG CAPS Take by mouth.    Marland Kitchen  ondansetron (ZOFRAN) 4 MG tablet Take 1 tablet (4 mg total) by mouth every 8 (eight) hours as needed for nausea or vomiting. 20 tablet 0  . oxybutynin (DITROPAN-XL) 10 MG 24 hr tablet TAKE 1 TABLET (10 MG TOTAL) BY MOUTH AT BEDTIME. 30 tablet 0  . oxyCODONE-acetaminophen (PERCOCET) 10-325 MG tablet Take 1 tablet by mouth every 8 (eight) hours as needed.  0  . topiramate (TOPAMAX) 100 MG tablet Take 100 mg by mouth 2 (two) times daily.  4  . traZODone (DESYREL) 50 MG tablet Take 50 mg by mouth at bedtime as needed for sleep.    Marland Kitchen zolpidem (AMBIEN) 10 MG tablet Take 10 mg by mouth at bedtime as needed for sleep.      No current facility-administered medications for this visit.      ALLERGIES: Patient has no known allergies.  Family History  Problem Relation Age of Onset  . Hypertension Mother   . Rheum arthritis Mother        Lupus  . Heart disease Mother   . Lupus Mother   . Diabetes Father   . Hypertension Father   . Stroke Father   . Hyperlipidemia Father   . Heart disease Father   . Lupus Brother    . Diabetes Brother   . Hyperlipidemia Brother   . Breast cancer Maternal Aunt 64       X 2, one in her 98's  . Lupus Maternal Aunt   . Breast cancer Cousin 63       maternal & paternal cousin  . Breast cancer Paternal Aunt 30       died in her 56's  . Lymphoma Son     Social History   Socioeconomic History  . Marital status: Married    Spouse name: Not on file  . Number of children: Not on file  . Years of education: Not on file  . Highest education level: Not on file  Occupational History  . Not on file  Social Needs  . Financial resource strain: Not on file  . Food insecurity:    Worry: Not on file    Inability: Not on file  . Transportation needs:    Medical: Not on file    Non-medical: Not on file  Tobacco Use  . Smoking status: Never Smoker  . Smokeless tobacco: Never Used  Substance and Sexual Activity  . Alcohol use: Yes    Alcohol/week: 1.0 standard drinks    Types: 1 Standard drinks or equivalent per week    Comment: rarely  . Drug use: No  . Sexual activity: Not Currently    Partners: Male    Birth control/protection: Surgical    Comment: TVH  Lifestyle  . Physical activity:    Days per week: Not on file    Minutes per session: Not on file  . Stress: Not on file  Relationships  . Social connections:    Talks on phone: Not on file    Gets together: Not on file    Attends religious service: Not on file    Active member of club or organization: Not on file    Attends meetings of clubs or organizations: Not on file    Relationship status: Not on file  . Intimate partner violence:    Fear of current or ex partner: Not on file    Emotionally abused: Not on file    Physically abused: Not on file    Forced sexual activity: Not  on file  Other Topics Concern  . Not on file  Social History Narrative  . Not on file    Review of Systems  Constitutional: Negative.   HENT: Negative.   Eyes: Negative.   Respiratory: Negative.   Cardiovascular:  Negative.   Gastrointestinal: Negative.   Endocrine: Negative.   Genitourinary: Negative.   Musculoskeletal: Negative.   Skin: Negative.   Allergic/Immunologic: Negative.   Neurological: Negative.   Hematological: Negative.   Psychiatric/Behavioral: Negative.     PHYSICAL EXAMINATION:    BP (!) 142/88 (BP Location: Right Arm, Patient Position: Sitting, Cuff Size: Normal)   Pulse 84   Wt 213 lb 3.2 oz (96.7 kg)   BMI 36.60 kg/m     General appearance: alert, cooperative and appears stated age  ASSESSMENT OAB, much improved with ditropan, side effects are tolerable Depression, improved with counseling (seeing Berniece Andreas)    PLAN Continue ditropan Call with any concerns Annual in 5/20   An After Visit Summary was printed and given to the patient.

## 2017-12-29 ENCOUNTER — Ambulatory Visit (INDEPENDENT_AMBULATORY_CARE_PROVIDER_SITE_OTHER): Payer: 59 | Admitting: Licensed Clinical Social Worker

## 2017-12-29 ENCOUNTER — Encounter: Payer: Self-pay | Admitting: Obstetrics and Gynecology

## 2017-12-29 ENCOUNTER — Ambulatory Visit (INDEPENDENT_AMBULATORY_CARE_PROVIDER_SITE_OTHER): Payer: 59 | Admitting: Obstetrics and Gynecology

## 2017-12-29 VITALS — BP 140/88 | HR 84 | Wt 213.2 lb

## 2017-12-29 DIAGNOSIS — N3281 Overactive bladder: Secondary | ICD-10-CM | POA: Diagnosis not present

## 2017-12-29 DIAGNOSIS — F3341 Major depressive disorder, recurrent, in partial remission: Secondary | ICD-10-CM

## 2017-12-29 MED ORDER — OXYBUTYNIN CHLORIDE ER 10 MG PO TB24: 10.0000 mg | ORAL_TABLET | Freq: Every day | ORAL | 2 refills | Status: DC

## 2017-12-29 MED FILL — OXYBUTYNIN CL ER 10 MG TAB: 10 | 90 days supply | Qty: 90 | Fill #0

## 2017-12-31 MED FILL — CEPHALEXIN 250 MG CAPSULE: 250 | 30 days supply | Qty: 30 | Fill #3

## 2017-12-31 MED FILL — ALPRAZolam 0.25 MG TABS: 0.25 | 30 days supply | Qty: 30 | Fill #4

## 2018-01-13 ENCOUNTER — Other Ambulatory Visit: Payer: Self-pay | Admitting: Family Medicine

## 2018-01-13 DIAGNOSIS — Z1231 Encounter for screening mammogram for malignant neoplasm of breast: Secondary | ICD-10-CM

## 2018-01-14 MED FILL — OXYCODONE-ACETAMINOPHEN 10-: 10-325 | 30 days supply | Qty: 90 | Fill #0

## 2018-01-17 MED FILL — LISINOPRIL-HCTZ 20-25 MG TA: 20-25 | 90 days supply | Qty: 90 | Fill #1

## 2018-01-19 MED FILL — DESVENLAFAXINE SUC ER 50 MG: 50 | 90 days supply | Qty: 90 | Fill #1

## 2018-02-01 ENCOUNTER — Ambulatory Visit (INDEPENDENT_AMBULATORY_CARE_PROVIDER_SITE_OTHER): Payer: 59 | Admitting: Licensed Clinical Social Worker

## 2018-02-01 DIAGNOSIS — F3341 Major depressive disorder, recurrent, in partial remission: Secondary | ICD-10-CM | POA: Diagnosis not present

## 2018-02-02 MED FILL — ALPRAZolam 0.25 MG TABS: 0.25 | 30 days supply | Qty: 30 | Fill #5

## 2018-02-02 MED FILL — TOPIRAMATE 100 MG TABLET: 100 | 30 days supply | Qty: 60 | Fill #0

## 2018-02-11 MED FILL — CEPHALEXIN 250 MG CAPSULE: 250 | 30 days supply | Qty: 30 | Fill #4

## 2018-02-14 MED FILL — HYDROCODON-APAP 10-325: 10-325 | 30 days supply | Qty: 90 | Fill #0

## 2018-02-15 MED FILL — OXYCODONE-ACETAMINOPHEN 10-: 10-325 | 30 days supply | Qty: 90 | Fill #0

## 2018-02-21 MED FILL — AMLODIPINE BESYLATE 10 MG T: 10 | 90 days supply | Qty: 90 | Fill #1

## 2018-02-28 ENCOUNTER — Ambulatory Visit
Admission: RE | Admit: 2018-02-28 | Discharge: 2018-02-28 | Disposition: A | Discharge status: Home / Self Care | Payer: 59 | Source: Ambulatory Visit | Attending: Family Medicine | Admitting: Family Medicine

## 2018-02-28 DIAGNOSIS — Z1231 Encounter for screening mammogram for malignant neoplasm of breast: Secondary | ICD-10-CM | POA: Diagnosis not present

## 2018-03-02 DIAGNOSIS — G4700 Insomnia, unspecified: Secondary | ICD-10-CM | POA: Diagnosis not present

## 2018-03-02 DIAGNOSIS — E119 Type 2 diabetes mellitus without complications: Secondary | ICD-10-CM | POA: Diagnosis not present

## 2018-03-02 DIAGNOSIS — I10 Essential (primary) hypertension: Secondary | ICD-10-CM | POA: Diagnosis not present

## 2018-03-02 DIAGNOSIS — F320 Major depressive disorder, single episode, mild: Secondary | ICD-10-CM | POA: Diagnosis not present

## 2018-03-04 MED FILL — ALPRAZolam 0.25 MG TABS: 0.25 | 30 days supply | Qty: 30 | Fill #0

## 2018-03-15 MED FILL — OXYCODONE-ACETAMINOPHEN 10-: 10-325 | 30 days supply | Qty: 90 | Fill #0

## 2018-03-25 MED FILL — TOPIRAMATE 100 MG TABLET: 100 | 30 days supply | Qty: 60 | Fill #1

## 2018-03-30 ENCOUNTER — Ambulatory Visit: Payer: 59 | Admitting: Licensed Clinical Social Worker

## 2018-04-06 DIAGNOSIS — M5412 Radiculopathy, cervical region: Secondary | ICD-10-CM | POA: Diagnosis not present

## 2018-04-06 DIAGNOSIS — M5417 Radiculopathy, lumbosacral region: Secondary | ICD-10-CM | POA: Diagnosis not present

## 2018-04-06 DIAGNOSIS — F419 Anxiety disorder, unspecified: Secondary | ICD-10-CM | POA: Diagnosis not present

## 2018-04-06 DIAGNOSIS — G43009 Migraine without aura, not intractable, without status migrainosus: Secondary | ICD-10-CM | POA: Diagnosis not present

## 2018-04-07 ENCOUNTER — Ambulatory Visit (INDEPENDENT_AMBULATORY_CARE_PROVIDER_SITE_OTHER): Payer: 59 | Admitting: Licensed Clinical Social Worker

## 2018-04-07 DIAGNOSIS — F3341 Major depressive disorder, recurrent, in partial remission: Secondary | ICD-10-CM

## 2018-04-07 MED FILL — ALPRAZolam 0.25 MG TABS: 0.25 | 30 days supply | Qty: 30 | Fill #1

## 2018-04-07 MED FILL — OXYBUTYNIN CL ER 10 MG TAB: 10 | 90 days supply | Qty: 90 | Fill #1

## 2018-04-07 MED FILL — CEPHALEXIN 250 MG CAPSULE: 250 | 30 days supply | Qty: 30 | Fill #5

## 2018-04-12 MED FILL — OXYCODONE-ACETAMINOPHEN 10-: 10-325 | 30 days supply | Qty: 90 | Fill #0

## 2018-04-25 MED FILL — DESVENLAFAXINE SUC ER 50 MG: 50 | 90 days supply | Qty: 90 | Fill #0

## 2018-04-25 MED FILL — LISINOPRIL-HCTZ 20-25 MG TA: 20-25 | 90 days supply | Qty: 90 | Fill #0

## 2018-05-02 MED FILL — TOPIRAMATE 100 MG TABLET: 100 | 30 days supply | Qty: 60 | Fill #2 | Status: TO

## 2018-05-11 MED FILL — OXYCODONE-ACETAMINOPHEN 10-: 10-325 | 30 days supply | Qty: 90 | Fill #0

## 2018-05-12 MED FILL — ALPRAZolam 0.25 MG TABS: 0.25 | 30 days supply | Qty: 30 | Fill #2

## 2018-06-01 ENCOUNTER — Ambulatory Visit (INDEPENDENT_AMBULATORY_CARE_PROVIDER_SITE_OTHER): Payer: 59 | Admitting: Licensed Clinical Social Worker

## 2018-06-01 DIAGNOSIS — F3341 Major depressive disorder, recurrent, in partial remission: Secondary | ICD-10-CM

## 2018-06-07 MED FILL — AMLODIPINE BESYLATE 10 MG T: 10 | 90 days supply | Qty: 90 | Fill #0

## 2018-06-07 MED FILL — CEPHALEXIN 250 MG CAPSULE: 250 | 30 days supply | Qty: 30 | Fill #6 | Status: TO

## 2018-06-08 MED FILL — OXYCODONE-ACETAMINOPHEN 10-: 10-325 | 30 days supply | Qty: 90 | Fill #0

## 2018-06-10 MED FILL — ALPRAZolam 0.25 MG TABS: 0.25 | 30 days supply | Qty: 30 | Fill #3 | Status: TO

## 2018-06-24 MED FILL — TOPIRAMATE 100 MG TABLET: 100 | 30 days supply | Qty: 60 | Fill #0

## 2018-07-11 MED FILL — OXYCODONE-APAP 10-325: 10-325 | 30 days supply | Qty: 90 | Fill #0

## 2018-07-13 DIAGNOSIS — M5417 Radiculopathy, lumbosacral region: Secondary | ICD-10-CM | POA: Diagnosis not present

## 2018-07-13 DIAGNOSIS — F419 Anxiety disorder, unspecified: Secondary | ICD-10-CM | POA: Diagnosis not present

## 2018-07-13 DIAGNOSIS — G43009 Migraine without aura, not intractable, without status migrainosus: Secondary | ICD-10-CM | POA: Diagnosis not present

## 2018-07-13 DIAGNOSIS — G603 Idiopathic progressive neuropathy: Secondary | ICD-10-CM | POA: Diagnosis not present

## 2018-07-15 MED FILL — CEPHALEXIN 250 MG CAPS: 250 | 30 days supply | Qty: 30 | Fill #0

## 2018-07-15 MED FILL — ALPRAZolam 0.25 MG TABS: 0.25 | 30 days supply | Qty: 30 | Fill #0

## 2018-07-27 ENCOUNTER — Ambulatory Visit: Payer: Self-pay | Admitting: Licensed Clinical Social Worker

## 2018-07-27 MED FILL — LISINOPRIL-HCTZ 20-25 MG TA: 20-25 | 90 days supply | Qty: 90 | Fill #0

## 2018-07-27 MED FILL — DESVENLAFAXINE SUC ER 50 MG: 50 | 90 days supply | Qty: 90 | Fill #0

## 2018-08-08 ENCOUNTER — Other Ambulatory Visit: Payer: Self-pay

## 2018-08-08 NOTE — Progress Notes (Signed)
58 y.o. [id number] Married Black or [place] Not Hispanic or Latino female here for annual exam.  H/O TVH.   H/O OAB on ditropan, helping. Just occasional leakage, only if she drinks a lot.   C/O itching in her groin, feels she has yeast, requests nystatin.   She is seeing her therapist, it is helping.   DM is under control with diet and exercise. She is Vegan (since the fall).     No LMP recorded. Patient has had a hysterectomy.          Sexually active: No.  The current method of family planning is status post hysterectomy.    Exercising: Yes.    biking, zumba Smoker:  no  Health Maintenance: Pap:  unsure History of abnormal Pap:  no MMG:  02/28/2018 Birads 1 negative Colonoscopy:  08/27/10, f/u due in 22 BMD:   Never TDaP:  Up to date Gardasil: N/A   reports that she has never smoked. She has never used smokeless tobacco. She reports current alcohol use of about 1.0 standard drinks of alcohol per week. She reports that she does not use drugs. Older son is 6041, h/o lymphoma in remission. Other son is 1437. She has 12 grand children and one great grandchild. 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   Past Medical History:  Diagnosis Date  . Arthritis   . Arthritis    both knees  . Chronic leg pain   . Depression   . Diabetes mellitus without complication (HCC)   . Hypertension   . Migraine without aura   . Morbid obesity (HCC)   . Urinary incontinence   . Vitamin D deficiency disease 2013    Past Surgical History:  Procedure Laterality Date  . KNEE ARTHROSCOPY W/ ACL RECONSTRUCTION Left 2004  . LAPAROSCOPIC CHOLECYSTECTOMY  2005  . LAPAROSCOPIC GASTRIC BANDING  2007  . TONSILLECTOMY  1984   Age 58  . TOTAL VAGINAL HYSTERECTOMY  age 58   Ovaries remain, for menorrhagia  . TUBAL LIGATION      Current Outpatient Medications  Medication Sig Dispense Refill  . ALPRAZolam (XANAX) 0.25 MG tablet Take 0.25 mg by mouth at bedtime as needed for anxiety.    Marland Kitchen. amLODipine  (NORVASC) 10 MG tablet Take 10 mg by mouth daily.  1  . cephALEXin (KEFLEX) 250 MG capsule Take 250 mg by mouth at bedtime.  5  . Cholecalciferol (VITAMIN D) 2000 UNITS tablet Take 2,000 Units by mouth daily.      Marland Kitchen. desvenlafaxine (PRISTIQ) 50 MG 24 hr tablet Take 50 mg by mouth daily.      Marland Kitchen. lisinopril-hydrochlorothiazide (PRINZIDE,ZESTORETIC) 20-25 MG per tablet Take 1 tablet by mouth daily.      . Multiple Vitamins-Minerals (MULTIVITAMIN WITH MINERALS) tablet Take 1 tablet by mouth daily.      Marland Kitchen. nystatin cream (MYCOSTATIN) Apply 1 application topically 2 (two) times daily. Apply to affected area BID for up to 7 days. 30 g 0  . Omega-3 Fatty Acids (FISH OIL) 1000 MG CAPS Take by mouth.    . oxybutynin (DITROPAN-XL) 10 MG 24 hr tablet Take 1 tablet (10 mg total) by mouth at bedtime. 90 tablet 2  . oxyCODONE-acetaminophen (PERCOCET) 10-325 MG tablet Take 1 tablet by mouth every 8 (eight) hours as needed.  0  . topiramate (TOPAMAX) 100 MG tablet Take 100 mg by mouth 2 (two) times daily.  4  . traZODone (DESYREL) 50 MG tablet Take 50 mg by mouth at  bedtime as needed for sleep.     No current facility-administered medications for this visit.     Family History  Problem Relation Age of Onset  . Hypertension Mother   . Rheum arthritis Mother        Lupus  . Heart disease Mother   . Lupus Mother   . Diabetes Father   . Hypertension Father   . Stroke Father   . Hyperlipidemia Father   . Heart disease Father   . Lupus Brother   . Diabetes Brother   . Hyperlipidemia Brother   . Breast cancer Maternal Aunt 64       X 2, one in her 70's  . Lupus Maternal Aunt   . Breast cancer Cousin 67       maternal & paternal cousin  . Breast cancer Paternal Aunt 30       died in her 52's  . Lymphoma Son     Review of Systems  Constitutional: Negative.   HENT: Negative.   Eyes: Negative.   Respiratory: Negative.   Cardiovascular: Negative.   Gastrointestinal: Negative.   Endocrine: Negative.    Genitourinary:       Irritation in groin bilateral  Musculoskeletal: Negative.   Skin: Negative.   Allergic/Immunologic: Negative.   Neurological: Negative.   Hematological: Negative.   Psychiatric/Behavioral: Negative.     Exam:   BP 118/84 (BP Location: Right Arm, Patient Position: Sitting, Cuff Size: Large)   Pulse 76   Temp (!) 97.2 F (36.2 C) (Skin)   Ht  (1.651 m)   Wt 190 lb 3.2 oz (86.3 kg)   BMI 31.65 kg/m   Weight change: @ Height:   Height:  (165.1 cm)  Ht Readings from Last 3 Encounters:  08/10/18  (1.651 m)  08/04/17  (1.626 m)  07/06/16  (1.702 m)    General appearance: alert, cooperative and appears stated age Head: Normocephalic, without obvious abnormality, atraumatic Neck: no adenopathy, supple, symmetrical, trachea midline and thyroid normal to inspection and palpation Lungs: clear to auscultation bilaterally Cardiovascular: regular rate and rhythm Breasts: normal appearance, no masses or tenderness Abdomen: soft, non-tender; non distended,  no masses,  no organomegaly Extremities: extremities normal, atraumatic, no cyanosis or edema Skin: Skin color, texture, turgor normal. Darkening of the skin, bilateral groin, irritated. Lymph nodes: Cervical, supraclavicular, and axillary nodes normal. No abnormal inguinal nodes palpated Neurologic: Grossly normal   Pelvic: External genitalia:  no lesions              Urethra:  normal appearing urethra with no masses, tenderness or lesions              Bartholins and Skenes: normal                 Vagina: atrophic appearing vagina with normal color and discharge, no lesions              Cervix: absent               Bimanual Exam:  Uterus:  uterus absent              Adnexa: no mass, fullness, tenderness               Rectovaginal: Confirms               Anus:  normal sphincter tone, no lesions  Chaperone was present for exam.  A:  Well Woman with normal  exam  OAB  Candida intertrigo.  Family history of breast cancer, she declines referral to genetics at this time. Will let me know if she changes her mind  P:   No Pap  Mammogram and colonoscopy are UTD  Recommend 3D mammograms  Discussed breast self exam, recommend she do this monthly  Discussed calcium and vit D intake  Continue ditropan  Nystatin

## 2018-08-09 MED FILL — OXYCODONE-APAP 10-325: 10-325 | 30 days supply | Qty: 90 | Fill #0

## 2018-08-10 ENCOUNTER — Encounter: Payer: Self-pay | Admitting: Obstetrics and Gynecology

## 2018-08-10 ENCOUNTER — Other Ambulatory Visit: Payer: Self-pay

## 2018-08-10 ENCOUNTER — Ambulatory Visit (INDEPENDENT_AMBULATORY_CARE_PROVIDER_SITE_OTHER): Payer: 59 | Admitting: Obstetrics and Gynecology

## 2018-08-10 VITALS — BP 118/84 | HR 76 | Temp 97.2°F | Ht 65.0 in | Wt 190.2 lb

## 2018-08-10 DIAGNOSIS — Z01419 Encounter for gynecological examination (general) (routine) without abnormal findings: Secondary | ICD-10-CM

## 2018-08-10 DIAGNOSIS — B372 Candidiasis of skin and nail: Secondary | ICD-10-CM | POA: Diagnosis not present

## 2018-08-10 DIAGNOSIS — N3281 Overactive bladder: Secondary | ICD-10-CM

## 2018-08-10 MED ORDER — OXYBUTYNIN CHLORIDE ER 10 MG PO TB24: 10.0000 mg | ORAL_TABLET | Freq: Every day | ORAL | 3 refills | Status: DC

## 2018-08-10 MED ORDER — NYSTATIN 100000 UNIT/GM EX CREA: 1.0000 "application " | TOPICAL_CREAM | Freq: Two times a day (BID) | CUTANEOUS | 0 refills | Status: DC

## 2018-08-10 MED FILL — NYSTATIN 100,000 UNIT/GM CR: 100000 | 7 days supply | Qty: 30 | Fill #0

## 2018-08-10 MED FILL — OXYBUTYNIN CL ER 10 MG TAB: 10 | 90 days supply | Qty: 90 | Fill #0

## 2018-08-10 NOTE — Patient Instructions (Signed)

## 2018-08-16 MED FILL — ALPRAZolam 0.25 MG TABS: 0.25 | 30 days supply | Qty: 30 | Fill #1

## 2018-08-16 MED FILL — TOPIRAMATE 100 MG TABLET: 100 | 30 days supply | Qty: 60 | Fill #1

## 2018-08-17 ENCOUNTER — Ambulatory Visit (INDEPENDENT_AMBULATORY_CARE_PROVIDER_SITE_OTHER): Payer: 59 | Admitting: Licensed Clinical Social Worker

## 2018-08-17 DIAGNOSIS — F3341 Major depressive disorder, recurrent, in partial remission: Secondary | ICD-10-CM | POA: Diagnosis not present

## 2018-08-17 DIAGNOSIS — R270 Ataxia, unspecified: Secondary | ICD-10-CM | POA: Diagnosis not present

## 2018-08-17 DIAGNOSIS — G603 Idiopathic progressive neuropathy: Secondary | ICD-10-CM | POA: Diagnosis not present

## 2018-08-17 DIAGNOSIS — G5603 Carpal tunnel syndrome, bilateral upper limbs: Secondary | ICD-10-CM | POA: Diagnosis not present

## 2018-08-17 DIAGNOSIS — G43009 Migraine without aura, not intractable, without status migrainosus: Secondary | ICD-10-CM | POA: Diagnosis not present

## 2018-08-17 DIAGNOSIS — Z79899 Other long term (current) drug therapy: Secondary | ICD-10-CM | POA: Diagnosis not present

## 2018-08-17 DIAGNOSIS — M5412 Radiculopathy, cervical region: Secondary | ICD-10-CM | POA: Diagnosis not present

## 2018-08-17 DIAGNOSIS — M5417 Radiculopathy, lumbosacral region: Secondary | ICD-10-CM | POA: Diagnosis not present

## 2018-08-17 DIAGNOSIS — F419 Anxiety disorder, unspecified: Secondary | ICD-10-CM | POA: Diagnosis not present

## 2018-08-31 DIAGNOSIS — G4700 Insomnia, unspecified: Secondary | ICD-10-CM | POA: Diagnosis not present

## 2018-08-31 DIAGNOSIS — E119 Type 2 diabetes mellitus without complications: Secondary | ICD-10-CM | POA: Diagnosis not present

## 2018-08-31 DIAGNOSIS — F320 Major depressive disorder, single episode, mild: Secondary | ICD-10-CM | POA: Diagnosis not present

## 2018-08-31 DIAGNOSIS — I10 Essential (primary) hypertension: Secondary | ICD-10-CM | POA: Diagnosis not present

## 2018-08-31 DIAGNOSIS — H5213 Myopia, bilateral: Secondary | ICD-10-CM | POA: Diagnosis not present

## 2018-09-08 MED FILL — CEPHALEXIN 250 MG CAPS: 250 | 30 days supply | Qty: 30 | Fill #1

## 2018-09-08 MED FILL — AMLODIPINE BESYLATE 10 MG T: 10 | 90 days supply | Qty: 90 | Fill #0

## 2018-09-08 MED FILL — OXYCODONE-APAP 10-325: 10-325 | 30 days supply | Qty: 90 | Fill #0

## 2018-09-19 MED FILL — ALPRAZolam 0.25 MG TABS: 0.25 | 30 days supply | Qty: 30 | Fill #0

## 2018-09-19 MED FILL — TOPIRAMATE 100 MG TABLET: 100 | 30 days supply | Qty: 60 | Fill #2

## 2018-10-05 DIAGNOSIS — R350 Frequency of micturition: Secondary | ICD-10-CM | POA: Diagnosis not present

## 2018-10-05 DIAGNOSIS — R351 Nocturia: Secondary | ICD-10-CM | POA: Diagnosis not present

## 2018-10-05 MED FILL — CEPHALEXIN 250 MG CAPSULE: 250 | 30 days supply | Qty: 30 | Fill #0

## 2018-10-06 MED FILL — OXYCODONE-APAP 10-325: 10-325 | 30 days supply | Qty: 90 | Fill #0

## 2018-10-12 ENCOUNTER — Ambulatory Visit (INDEPENDENT_AMBULATORY_CARE_PROVIDER_SITE_OTHER): Payer: 59 | Admitting: Licensed Clinical Social Worker

## 2018-10-12 DIAGNOSIS — F3341 Major depressive disorder, recurrent, in partial remission: Secondary | ICD-10-CM | POA: Diagnosis not present

## 2018-10-17 MED FILL — LISINOPRIL-HCTZ 20-25 MG TA: 20-25 | 90 days supply | Qty: 90 | Fill #0

## 2018-10-17 MED FILL — TOPIRAMATE 100 MG TABLET: 100 | 30 days supply | Qty: 60 | Fill #3

## 2018-10-17 MED FILL — CEPHALEXIN 250 MG CAPSULE: 250 | 30 days supply | Qty: 30 | Fill #0

## 2018-10-17 MED FILL — ALPRAZolam 0.25 MG TABS: 0.25 | 30 days supply | Qty: 30 | Fill #0

## 2018-10-18 MED FILL — DESVENLAFAXINE SUC ER 50 MG: 50 | 90 days supply | Qty: 90 | Fill #0

## 2018-10-26 DIAGNOSIS — M5417 Radiculopathy, lumbosacral region: Secondary | ICD-10-CM | POA: Diagnosis not present

## 2018-10-26 DIAGNOSIS — M542 Cervicalgia: Secondary | ICD-10-CM | POA: Diagnosis not present

## 2018-10-26 DIAGNOSIS — G5603 Carpal tunnel syndrome, bilateral upper limbs: Secondary | ICD-10-CM | POA: Diagnosis not present

## 2018-10-26 DIAGNOSIS — G43009 Migraine without aura, not intractable, without status migrainosus: Secondary | ICD-10-CM | POA: Diagnosis not present

## 2018-10-26 DIAGNOSIS — F419 Anxiety disorder, unspecified: Secondary | ICD-10-CM | POA: Diagnosis not present

## 2018-10-26 DIAGNOSIS — G629 Polyneuropathy, unspecified: Secondary | ICD-10-CM | POA: Diagnosis not present

## 2018-10-29 ENCOUNTER — Emergency Department (HOSPITAL_COMMUNITY)
Admission: EM | Admit: 2018-10-29 | Discharge: 2018-10-29 | Disposition: A | Discharge status: Home / Self Care | Payer: 59 | Attending: Emergency Medicine | Admitting: Emergency Medicine

## 2018-10-29 ENCOUNTER — Emergency Department (HOSPITAL_COMMUNITY): Payer: 59

## 2018-10-29 ENCOUNTER — Other Ambulatory Visit: Payer: Self-pay

## 2018-10-29 ENCOUNTER — Encounter (HOSPITAL_COMMUNITY): Payer: Self-pay

## 2018-10-29 DIAGNOSIS — I10 Essential (primary) hypertension: Secondary | ICD-10-CM | POA: Insufficient documentation

## 2018-10-29 DIAGNOSIS — R0789 Other chest pain: Secondary | ICD-10-CM | POA: Diagnosis not present

## 2018-10-29 DIAGNOSIS — R7989 Other specified abnormal findings of blood chemistry: Secondary | ICD-10-CM | POA: Diagnosis not present

## 2018-10-29 DIAGNOSIS — R0602 Shortness of breath: Secondary | ICD-10-CM | POA: Insufficient documentation

## 2018-10-29 DIAGNOSIS — R079 Chest pain, unspecified: Secondary | ICD-10-CM | POA: Diagnosis not present

## 2018-10-29 DIAGNOSIS — E119 Type 2 diabetes mellitus without complications: Secondary | ICD-10-CM | POA: Insufficient documentation

## 2018-10-29 DIAGNOSIS — R6884 Jaw pain: Secondary | ICD-10-CM | POA: Diagnosis not present

## 2018-10-29 DIAGNOSIS — R072 Precordial pain: Secondary | ICD-10-CM | POA: Diagnosis not present

## 2018-10-29 DIAGNOSIS — Z79899 Other long term (current) drug therapy: Secondary | ICD-10-CM | POA: Diagnosis not present

## 2018-10-29 LAB — BASIC METABOLIC PANEL
Anion gap: 8 (ref 5–15)
BUN: 20 mg/dL (ref 6–20)
CO2: 25 mmol/L (ref 22–32)
Calcium: 9.9 mg/dL (ref 8.9–10.3)
Chloride: 109 mmol/L (ref 98–111)
Creatinine, Ser: 0.79 mg/dL (ref 0.44–1.00)
GFR calc Af Amer: >60 mL/min (ref >60)
GFR calc non Af Amer: >60 mL/min (ref >60)
Glucose, Bld: 101 mg/dL — ABNORMAL HIGH (ref 70–99)
Potassium: 4 mmol/L (ref 3.5–5.1)
Sodium: 142 mmol/L (ref 135–145)

## 2018-10-29 LAB — CBC
HCT: 41.3 % (ref 36.0–46.0)
Hemoglobin: 12.9 g/dL (ref 12.0–15.0)
MCH: 23.8 pg — ABNORMAL LOW (ref 26.0–34.0)
MCHC: 31.2 g/dL (ref 30.0–36.0)
MCV: 76.1 fL — ABNORMAL LOW (ref 80.0–100.0)
Platelets: 350 10*3/uL (ref 150–400)
RBC: 5.43 MIL/uL — ABNORMAL HIGH (ref 3.87–5.11)
RDW: 15.2 % (ref 11.5–15.5)
WBC: 7.7 10*3/uL (ref 4.0–10.5)
nRBC: 0 % (ref 0.0–0.2)

## 2018-10-29 LAB — I-STAT BETA HCG BLOOD, ED (MC, WL, AP ONLY): I-stat hCG, quantitative: <5 m[IU]/mL (ref <5)

## 2018-10-29 LAB — TROPONIN I (HIGH SENSITIVITY)
Troponin I (High Sensitivity): 3 ng/L (ref <18)
Troponin I (High Sensitivity): 3 ng/L (ref <18)

## 2018-10-29 LAB — D-DIMER, QUANTITATIVE: D-Dimer, Quant: 1.02 ug/mL-FEU — ABNORMAL HIGH (ref 0.00–0.50)

## 2018-10-29 MED ORDER — SODIUM CHLORIDE 0.9% FLUSH
3.0000 mL | Freq: Once | INTRAVENOUS | Status: AC
  Administered 2018-10-29: 09:00:00 3 mL via INTRAVENOUS

## 2018-10-29 MED ORDER — IOHEXOL 350 MG/ML SOLN
100.0000 mL | Freq: Once | INTRAVENOUS | Status: AC | PRN
  Administered 2018-10-29: 10:00:00 100 mL via INTRAVENOUS

## 2018-10-29 MED ORDER — ASPIRIN 325 MG PO TABS
325.0000 mg | ORAL_TABLET | Freq: Every day | ORAL | Status: DC
  Administered 2018-10-29: 325 mg via ORAL
  Filled 2018-10-29: qty 1

## 2018-10-29 MED ORDER — SODIUM CHLORIDE (PF) 0.9 % IJ SOLN
INTRAMUSCULAR | Status: AC
  Filled 2018-10-29: qty 50

## 2018-10-29 NOTE — Discharge Instructions (Signed)
You were seen in the ED today for chest pain that began last night Your EKG, chest x ray, and lab work were all reassuring today We did obtain a CT scan of your chest which was negative for a blood clot called pulmonary embolism  Please follow up with Dr. Nancy Fetter on Monday regarding your ED visit Return to the ED for any worsening symptoms including worsening pain, shortness of breath, nausea, vomiting, radiation of pain into your arms or back

## 2018-10-29 NOTE — ED Notes (Signed)
Patient transported to CT 

## 2018-10-29 NOTE — ED Notes (Signed)
Patient ambulatory to XR

## 2018-10-29 NOTE — ED Provider Notes (Signed)
[facility]-EMERGENCY DEPT Provider Note   CSN: [id number] Arrival date & time: 10/29/18  [id number]    History   Chief Complaint Chief Complaint  Patient presents with  . Chest Pain    HPI Samantha Molina is a 58 y.o. female with PMhx diabetes and HTN who presents to the ED complaining of sudden onset, constant, achy, substernal chest pain that began around 11:30 PM last night. Pt also complains of mild shortness of breath and right sided jaw pain that began this morning. Pt has not taken anything for pain. Reports the pain kept her up all last night. She decided to come to the ED today after she began having jaw pain as she was concerned. Positive Fhx of CAD - father passed away at the age of 58 from an MI. Pt has hx similar chest pain in the past but reports she had a full workup which was negative. Stress test in the early 2000's which was normal. Pt is active and walks often outside without chest pain. Denies fever, chills, abdominal pain, nausea, vomiting, leg swelling, diaphoresis, or any other associated symptoms. No recent prolonged travel or immobilization. No hx DVT/PE. No hemoptysis. No OCPs.        Past Medical History:  Diagnosis Date  . Arthritis   . Arthritis    both knees  . Chronic leg pain   . Depression   . Diabetes mellitus without complication (HCC)   . Hypertension   . Migraine without aura   . Morbid obesity (HCC)   . Urinary incontinence   . Vitamin D deficiency disease 2013    Patient Active Problem List   Diagnosis Date Noted  . Acute pyelonephritis 07/07/2016  . Sepsis (HCC) 07/06/2016  . HTN (hypertension) 07/06/2016  . Cystitis with hematuria 07/06/2016  . Lapband APL in [place] 2007 09/21/2013    Past Surgical History:  Procedure Laterality Date  . KNEE ARTHROSCOPY W/ ACL RECONSTRUCTION Left 2004  . LAPAROSCOPIC CHOLECYSTECTOMY  2005  . LAPAROSCOPIC GASTRIC BANDING  2007  . TONSILLECTOMY  1984   Age 58  . TOTAL VAGINAL  HYSTERECTOMY  age 58   Ovaries remain, for menorrhagia  . TUBAL LIGATION       OB History    Gravida  3   Para  2   Term  2   Preterm      AB  1   Living  2     SAB      TAB      Ectopic      Multiple      Live Births  2            Home Medications    Prior to Admission medications   Medication Sig Start Date End Date Taking? Authorizing Provider  ALPRAZolam Prudy Feeler(XANAX) 0.25 MG tablet Take 0.25 mg by mouth at bedtime as needed for anxiety.   Yes [provider]  amLODipine (NORVASC) 10 MG tablet Take 10 mg by mouth daily. 05/04/16  Yes [provider]  cephALEXin (KEFLEX) 250 MG capsule Take 250 mg by mouth at bedtime. 06/17/17  Yes [provider]  Cholecalciferol (VITAMIN D) 2000 UNITS tablet Take 2,000 Units by mouth daily.     Yes [provider]  desvenlafaxine (PRISTIQ) 50 MG 24 hr tablet Take 50 mg by mouth daily.     Yes [provider]  lisinopril-hydrochlorothiazide (PRINZIDE,ZESTORETIC) 20-25 MG per tablet Take 1 tablet by mouth daily.  Yes [provider]  Multiple Vitamins-Minerals (MULTIVITAMIN WITH MINERALS) tablet Take 1 tablet by mouth daily.     Yes [provider]  oxybutynin (DITROPAN-XL) 10 MG 24 hr tablet Take 1 tablet (10 mg total) by mouth at bedtime. 08/10/18  Yes Romualdo BolkJertson, Jill Evelyn, MD  oxyCODONE-acetaminophen (PERCOCET) 10-325 MG tablet Take 1 tablet by mouth every 8 (eight) hours as needed. 06/09/16  Yes [provider]  topiramate (TOPAMAX) 100 MG tablet Take 100 mg by mouth 2 (two) times daily. 06/12/16  Yes [provider]  traZODone (DESYREL) 50 MG tablet Take 50 mg by mouth at bedtime as needed for sleep.   Yes [provider]  nystatin cream (MYCOSTATIN) Apply 1 application topically 2 (two) times daily. Apply to affected area BID for up to 7 days. Patient not taking: Reported on 10/29/2018 08/10/18   Romualdo BolkJertson, Jill Evelyn, MD    Family History  Family History  Problem Relation Age of Onset  . Hypertension Mother   . Rheum arthritis Mother        Lupus  . Heart disease Mother   . Lupus Mother   . Diabetes Father   . Hypertension Father   . Stroke Father   . Hyperlipidemia Father   . Heart disease Father   . Lupus Brother   . Diabetes Brother   . Hyperlipidemia Brother   . Breast cancer Maternal Aunt 64       X 2, one in her 9650's  . Lupus Maternal Aunt   . Breast cancer Cousin 3842       maternal & paternal cousin  . Breast cancer Paternal Aunt 30       died in her 7940's  . Lymphoma Son     Social History Social History   Tobacco Use  . Smoking status: Never Smoker  . Smokeless tobacco: Never Used  Substance Use Topics  . Alcohol use: Yes    Alcohol/week: 1.0 standard drinks    Types: 1 Standard drinks or equivalent per week    Comment: rarely  . Drug use: No     Allergies   Patient has no known allergies.   Review of Systems Review of Systems  Constitutional: Negative for chills and fever.  HENT: Negative for congestion.   Eyes: Negative for visual disturbance.  Respiratory: Positive for shortness of breath. Negative for cough.   Cardiovascular: Positive for chest pain. Negative for palpitations and leg swelling.  Gastrointestinal: Negative for abdominal pain, nausea and vomiting.  Genitourinary: Negative for difficulty urinating.  Musculoskeletal: Negative for myalgias.       + jaw pain  Skin: Negative for rash.  Neurological: Negative for headaches.     Physical Exam Updated Vital Signs BP 134/77 (BP Location: Left Arm)   Pulse (!) 56   Temp 98.3 F (36.8 C) (Oral)   Resp 17   Ht 5\' 6"  (1.676 m)   Wt 81.6 kg   SpO2 100%   BMI 29.05 kg/m   Physical Exam Vitals signs and nursing note reviewed.  Constitutional:      Appearance: She is not ill-appearing.  HENT:     Head: Normocephalic and atraumatic.  Eyes:     Conjunctiva/sclera: Conjunctivae normal.  Neck:     Musculoskeletal:  Neck supple.  Cardiovascular:     Rate and Rhythm: Normal rate and regular rhythm.     Pulses:          Radial pulses are 2+ on the right side  and 2+ on the left side.     Heart sounds: Normal heart sounds.  Pulmonary:     Effort: Pulmonary effort is normal.     Breath sounds: Normal breath sounds. No decreased breath sounds, wheezing, rhonchi or rales.  Chest:     Chest wall: No tenderness.  Abdominal:     Palpations: Abdomen is soft.     Tenderness: There is no abdominal tenderness.  Musculoskeletal:     Right lower leg: No edema.     Left lower leg: No edema.  Skin:    General: Skin is warm and dry.  Neurological:     Mental Status: She is alert.      ED Treatments / Results  Labs (all labs ordered are listed, but only abnormal results are displayed) Labs Reviewed  BASIC METABOLIC PANEL - Abnormal; Notable for the following components:      Result Value   Glucose, Bld 101 (*)    All other components within normal limits  CBC - Abnormal; Notable for the following components:   RBC 5.43 (*)    MCV 76.1 (*)    MCH 23.8 (*)    All other components within normal limits  D-DIMER, QUANTITATIVE (NOT AT [facility]) - Abnormal; Notable for the following components:   D-Dimer, Quant 1.02 (*)    All other components within normal limits  I-STAT BETA HCG BLOOD, ED ([facility], [facility], [facility] ONLY)  TROPONIN I (HIGH SENSITIVITY)  TROPONIN I (HIGH SENSITIVITY)    EKG None  Radiology Dg Chest 2 View  Result Date: 10/29/2018 CLINICAL DATA:  Chest pain EXAM: CHEST - 2 VIEW COMPARISON:  07/06/2016 chest radiograph. FINDINGS: Stable cardiomediastinal silhouette with normal heart size. No pneumothorax. No pleural effusion. Lungs appear clear, with no acute consolidative airspace disease and no pulmonary edema. IMPRESSION: No active cardiopulmonary disease. Electronically Signed   By: Ilona Sorrel M.D.   On: 10/29/2018 09:30   Ct Angio Chest Pe W/cm &/or Wo Cm  Result Date: 10/29/2018 CLINICAL DATA:   58 year old female with acute chest pain for 1 day and elevated D-dimer. EXAM: CT ANGIOGRAPHY CHEST WITH CONTRAST TECHNIQUE: Multidetector CT imaging of the chest was performed using the standard protocol during bolus administration of intravenous contrast. Multiplanar CT image reconstructions and MIPs were obtained to evaluate the vascular anatomy. CONTRAST:  160mL OMNIPAQUE IOHEXOL 350 MG/ML SOLN COMPARISON:  10/29/2018 and prior chest radiographs FINDINGS: Cardiovascular: This is a technically adequate study but respiratory motion artifact in the LOWER lungs slightly decreases sensitivity. No pulmonary emboli are identified. Cardiomegaly. LAD coronary and aortic atherosclerotic calcifications noted. No thoracic aortic aneurysm or pericardial effusion. Mediastinum/Nodes: No enlarged mediastinal, hilar, or axillary lymph nodes. Thyroid gland, trachea, and esophagus demonstrate no significant findings. Lungs/Pleura: The lungs are clear except for minimal bibasilar atelectasis/scarring. No airspace disease, consolidation, mass, nodule, pleural effusion or pneumothorax. Upper Abdomen: Gastric banding apparatus noted. No acute abnormality. Musculoskeletal: No acute or suspicious bony abnormality. Review of the MIP images confirms the above findings. IMPRESSION: 1. No evidence of pulmonary emboli. 2. Cardiomegaly and coronary artery disease. 3.  Aortic Atherosclerosis (ICD10-I70.0). Electronically Signed   By: Margarette Canada M.D.   On: 10/29/2018 11:24    Procedures Procedures (including critical care time)  Medications Ordered in ED Medications  aspirin tablet 325 mg (325 mg Oral Given 10/29/18 0951)  sodium chloride (PF) 0.9 % injection (has no administration in time range)  sodium chloride flush (NS) 0.9 % injection 3 mL (3 mLs Intravenous Given  10/29/18 0858)  iohexol (OMNIPAQUE) 350 MG/ML injection 100 mL (100 mLs Intravenous Contrast Given 10/29/18 1027)     Initial Impression / Assessment and Plan / ED  Course  I have reviewed the triage vital signs and the nursing notes.  Pertinent labs & imaging results that were available during my care of the patient were reviewed by me and considered in my medical decision making (see chart for details).  58 year old female presents to the ED with chest pain, shortness of breath, and right sided jaw pain x 10 hours. No nausea or vomiting. Has significant fhx for CAD and risk factors herself including HTN and diabetes. Concern for ACS today. Doubt dissection. Unable to [facility] out given age. EKG without ischemic changes today. Bloodwork obtained including CBC, BMP, trop, D dimer, and U/A. CXR as well. 324 mg ASA given. Will reevaluate once labs and imaging return.   Pain improved with aspirin. Initial trop of 3. Remainder of bloodwork reassuring besides elevated D dimer of 1.02. Will obtain CTA at this time to rule out PE. Repeat trop to be collected as well to ensure it is not increasing.   CTA negative for PE today. Repeat trop of 3. Considering no change in troponins feel patient is stable for discharge home at this time. Will have her follow up with PCP on Monday. She is in agreement with plan at this time.   Clinical Course as of Oct 29 1215  Sat Oct 29, 2018  1017 Will proceed with CTA  D-Dimer, Quant(!): 1.02 [MV]  1139 Negative for PE  CT Angio Chest PE W/Cm &/Or Wo Cm [MV]    Clinical Course User Index [MV] Tanda RockersVenter, Kalima Saylor, PA-C         Final Clinical Impressions(s) / ED Diagnoses   Final diagnoses:  Nonspecific chest pain    ED Discharge Orders    None       Tanda RockersVenter, Aribella Vavra, PA-C 10/29/18 1611    Loren RacerYelverton, David, MD 11/04/18 1751

## 2018-10-29 NOTE — ED Triage Notes (Signed)
Pt presents with c/o chest pain that started last night at work. Pt reports the pain is in the center of her chest. Pt reports that the pain woke her up several times during the night. Pt also reporting jaw pain that started this morning. Hx of heart attack in her family.

## 2018-11-01 DIAGNOSIS — I700 Atherosclerosis of aorta: Secondary | ICD-10-CM | POA: Diagnosis not present

## 2018-11-01 DIAGNOSIS — R079 Chest pain, unspecified: Secondary | ICD-10-CM | POA: Diagnosis not present

## 2018-11-01 DIAGNOSIS — I10 Essential (primary) hypertension: Secondary | ICD-10-CM | POA: Diagnosis not present

## 2018-11-04 MED FILL — OXYCODONE-APAP 10-325: 10-325 | 30 days supply | Qty: 90 | Fill #0

## 2018-11-17 MED FILL — ALPRAZolam 0.25 MG TABS: 0.25 | 30 days supply | Qty: 30 | Fill #1

## 2018-11-17 MED FILL — CEPHALEXIN 250 MG CAPSULE: 250 | 30 days supply | Qty: 30 | Fill #1

## 2018-11-21 MED FILL — TOPIRAMATE 100 MG TABLET: 100 | 30 days supply | Qty: 60 | Fill #0

## 2018-11-22 MED FILL — traZODone HCL 50 MG TABS: 50 | 90 days supply | Qty: 90 | Fill #0

## 2018-11-30 MED FILL — TOPIRAMATE 100 MG TABLET: 100 | 30 days supply | Qty: 60 | Fill #0

## 2018-12-02 MED FILL — traZODone HCL 50 MG TABS: 50 | 90 days supply | Qty: 90 | Fill #0

## 2018-12-05 MED FILL — OXYCODONE-APAP 10-325: 10-325 | 30 days supply | Qty: 90 | Fill #0

## 2018-12-07 ENCOUNTER — Ambulatory Visit (INDEPENDENT_AMBULATORY_CARE_PROVIDER_SITE_OTHER): Payer: 59 | Admitting: Licensed Clinical Social Worker

## 2018-12-07 DIAGNOSIS — F3341 Major depressive disorder, recurrent, in partial remission: Secondary | ICD-10-CM | POA: Diagnosis not present

## 2018-12-07 MED FILL — AMLODIPINE BESYLATE 10 MG T: 10 | 90 days supply | Qty: 90 | Fill #0

## 2018-12-29 MED FILL — TOPIRAMATE 100 MG TABLET: 100 | 30 days supply | Qty: 60 | Fill #1

## 2019-01-02 MED FILL — OXYCODONE-APAP 10-325: 10-325 | 30 days supply | Qty: 90 | Fill #0

## 2019-01-02 MED FILL — OXYBUTYNIN CL ER 10 MG TAB: 10 | 90 days supply | Qty: 90 | Fill #1

## 2019-01-09 ENCOUNTER — Other Ambulatory Visit: Payer: Self-pay

## 2019-01-09 ENCOUNTER — Ambulatory Visit (INDEPENDENT_AMBULATORY_CARE_PROVIDER_SITE_OTHER): Payer: 59 | Admitting: Cardiology

## 2019-01-09 ENCOUNTER — Encounter: Payer: Self-pay | Admitting: Cardiology

## 2019-01-09 VITALS — BP 112/78 | HR 68 | Temp 96.9°F | Ht 66.0 in | Wt 179.2 lb

## 2019-01-09 DIAGNOSIS — Z01812 Encounter for preprocedural laboratory examination: Secondary | ICD-10-CM

## 2019-01-09 DIAGNOSIS — R072 Precordial pain: Secondary | ICD-10-CM | POA: Diagnosis not present

## 2019-01-09 DIAGNOSIS — Z9884 Bariatric surgery status: Secondary | ICD-10-CM | POA: Diagnosis not present

## 2019-01-09 DIAGNOSIS — Z8249 Family history of ischemic heart disease and other diseases of the circulatory system: Secondary | ICD-10-CM | POA: Insufficient documentation

## 2019-01-09 DIAGNOSIS — I10 Essential (primary) hypertension: Secondary | ICD-10-CM

## 2019-01-09 DIAGNOSIS — Z7189 Other specified counseling: Secondary | ICD-10-CM | POA: Diagnosis not present

## 2019-01-09 MED ORDER — METOPROLOL TARTRATE 25 MG PO TABS: ORAL_TABLET | ORAL | 0 refills | Status: DC

## 2019-01-09 MED FILL — METOPROLOL TARTRATE 25 MG T: 25 | 1 days supply | Qty: 1 | Fill #0

## 2019-01-09 NOTE — Progress Notes (Signed)
Cardiology Office Note:    Date:  01/09/2019   ID:  Samantha Molina, DOB [birth year], MRN [id number]  PCP:  Samantha James, MD  Cardiologist:  Samantha Red, MD  Referring MD: Samantha James, MD   CC: new patient consult for chest pain  History of Present Illness:    Samantha Molina is a 58 y.o. female with a hx of hypertension, depression/anxiety who is seen as a new consult at the request of Samantha James, MD for the evaluation and management of chest pain.  ER notes reviewed today. Unfortunately do not have records from PCP office.   Chest pain: -Initial onset: reports prior history, had full workup and was told everything was fine. Happened when she was living in [place], was 300 lbs at the time. Had lap-band done in 2007, lost a huge amount weight. Has been vigilant about diet and exercise. Had been fine with no further chest pain until most recent episode and then recurrent episode -Recent event: 10/28/18, sudden onset around 1130 PM. Aching, substernal, associated wit shortness of breath and right jaw pain. Seen in ER 10/29/18 when pain had been constant for several hours. D dimer elevated but CTPE negative. hsTn 3 -> 3. ECG nonischemic  Had another episode when she drank keto coffee again, similar, but less intense/shorter duration, did not go to ER for this. No episodes since stopping drinking keto coffee.  -Quality: nonexertional, substernal -Frequency: just two events -Duration: almost a day during the event that brought her to the ER -Associated symptoms: shortness of breath, jaw pain. -Aggravating/alleviating factors: was drinking keto coffee at the time and doing a running challenge at the time. Since stopping the keto coffee and cutting back on her running miles, has not recurred.  -Prior cardiac history: reports prior distant workup for chest pain, I do not have records -Prior ECG: NSR, abnormal R wave progression -Prior workup: stress test early 2000s reported as  normal.  -Prior treatment: none -Alcohol: about 1 drink/week -Tobacco: never -Comorbidities: prior obesity, current hypertension -Exercise level: runs routinely, no chest pain with running.  -Cardiac ROS: no shortness of breath, no PND, no orthopnea, no LE edema, no syncope  Family history: father passed away from MI at age 43. Samantha Molina died age 11 from lupus/kidney disease, was on dialysis. Mother: alive age 80, has heart disease, hypertension, arthritis Father: deceased age 11, heart disease, diabetes, hypertension Samantha Molina: heart disease, hypertension Samantha Molina: heart disease, hypertension Samantha Molina: cancer Samantha Molina: diabetes, heart disease, hypertension Samantha Molina: alive age 33, obese, in SNF  Lost 1st husband from lung cancer in [place], current husband is on dialysis.  Past Medical History:  Diagnosis Date  . Arthritis   . Arthritis    both knees  . Chronic leg pain   . Depression   . Diabetes mellitus without complication (HCC)   . Hypertension   . Migraine without aura   . Morbid obesity (HCC)   . Urinary incontinence   . Vitamin D deficiency disease 2013    Past Surgical History:  Procedure Laterality Date  . KNEE ARTHROSCOPY W/ ACL RECONSTRUCTION Left 2004  . LAPAROSCOPIC CHOLECYSTECTOMY  2005  . LAPAROSCOPIC GASTRIC BANDING  2007  . TONSILLECTOMY  1984   Age 36  . TOTAL VAGINAL HYSTERECTOMY  age 19   Ovaries remain, for menorrhagia  . TUBAL LIGATION      Current Medications: Current Outpatient Medications on File Prior to Visit  Medication Sig  . ALPRAZolam (XANAX) 0.25 MG tablet  Take 0.25 mg by mouth at bedtime as needed for anxiety.  Marland Kitchen. amLODipine (NORVASC) 10 MG tablet Take 10 mg by mouth daily.  . cephALEXin (KEFLEX) 250 MG capsule Take 250 mg by mouth at bedtime.  . Cholecalciferol (VITAMIN D) 2000 UNITS tablet Take 2,000 Units by mouth daily.    Marland Kitchen. desvenlafaxine (PRISTIQ) 50 MG 24 hr tablet Take 50 mg by mouth daily.    Marland Kitchen. lisinopril-hydrochlorothiazide  (PRINZIDE,ZESTORETIC) 20-25 MG per tablet Take 1 tablet by mouth daily.    . Multiple Vitamins-Minerals (MULTIVITAMIN WITH MINERALS) tablet Take 1 tablet by mouth daily.    Marland Kitchen. nystatin cream (MYCOSTATIN) Apply 1 application topically 2 (two) times daily. Apply to affected area BID for up to 7 days.  Marland Kitchen. oxybutynin (DITROPAN-XL) 10 MG 24 hr tablet Take 1 tablet (10 mg total) by mouth at bedtime.  Marland Kitchen. oxyCODONE-acetaminophen (PERCOCET) 10-325 MG tablet Take 1 tablet by mouth every 8 (eight) hours as needed.  . topiramate (TOPAMAX) 100 MG tablet Take 100 mg by mouth 2 (two) times daily.  . traZODone (DESYREL) 50 MG tablet Take 50 mg by mouth at bedtime as needed for sleep.   No current facility-administered medications on file prior to visit.      Allergies:   Patient has no known allergies.   Social History   Tobacco Use  . Smoking status: Never Smoker  . Smokeless tobacco: Never Used  Substance Use Topics  . Alcohol use: Yes    Alcohol/week: 1.0 standard drinks    Types: 1 Standard drinks or equivalent per week    Comment: rarely  . Drug use: No    Family History: family history includes Breast cancer (age of onset: 1730) in her paternal aunt; Breast cancer (age of onset: 3042) in her cousin; Breast cancer (age of onset: 8264) in her maternal aunt; Diabetes in her Samantha Molina and father; Heart disease in her father and mother; Hyperlipidemia in her Samantha Molina and father; Hypertension in her father and mother; Lupus in her Samantha Molina, maternal aunt, and mother; Lymphoma in her son; Rheum arthritis in her mother; Stroke in her father.  Family history: father passed away from MI at age 58. Samantha Molina died age 58 from lupus/kidney disease, was on dialysis. Mother: alive age 58, has heart disease, hypertension, arthritis Father: deceased age 58, heart disease, diabetes, hypertension Samantha Molina: heart disease, hypertension Samantha Molina: heart disease, hypertension Samantha Molina: cancer Samantha Molina: diabetes, heart disease,  hypertension Samantha Molina: alive age 58, obese, in SNF  ROS:   Please see the history of present illness.  Additional pertinent ROS: Constitutional: Negative for chills, fever, night sweats, unintentional weight loss  HENT: Negative for ear pain and hearing loss.   Eyes: Negative for loss of vision and eye pain.  Respiratory: Negative for cough, sputum, wheezing.   Cardiovascular: See HPI. Gastrointestinal: Negative for abdominal pain, melena, and hematochezia.  Genitourinary: Negative for dysuria and hematuria.  Musculoskeletal: Negative for falls and myalgias.  Skin: Negative for itching and rash.  Neurological: Negative for focal weakness, focal sensory changes and loss of consciousness.  Endo/Heme/Allergies: Does not bruise/bleed easily.     EKGs/Labs/Other Studies Reviewed:    The following studies were reviewed today: ER notes and evaluation  EKG:  EKG is personally reviewed.  The ekg ordered today demonstrates NSR at 68 bpm  Recent Labs: 10/29/2018: BUN 20; Creatinine, Ser 0.79; Hemoglobin 12.9; Platelets 350; Potassium 4.0; Sodium 142  Recent Lipid Panel No results found for: CHOL, TRIG, HDL, CHOLHDL, VLDL, LDLCALC,  LDLDIRECT  Physical Exam:    VS:  BP 112/78   Pulse 68   Temp (!) 96.9 F (36.1 C)   Ht 5\' 6"  (1.676 m)   Wt 179 lb 3.2 oz (81.3 kg)   SpO2 99%   BMI 28.92 kg/m     Wt Readings from Last 3 Encounters:  01/09/19 179 lb 3.2 oz (81.3 kg)  10/29/18 180 lb (81.6 kg)  08/10/18 190 lb 3.2 oz (86.3 kg)    GEN: Well nourished, well developed in no acute distress HEENT: Normal, moist mucous membranes NECK: No JVD CARDIAC: regular rhythm, normal S1 and S2, no murmurs, rubs, gallops.  VASCULAR: Radial and DP pulses 2+ bilaterally. No carotid bruits RESPIRATORY:  Clear to auscultation without rales, wheezing or rhonchi  ABDOMEN: Soft, non-tender, non-distended MUSCULOSKELETAL:  Ambulates independently SKIN: Warm and dry, no edema NEUROLOGIC:  Alert and oriented  x 3. No focal neuro deficits noted. PSYCHIATRIC:  Normal affect    ASSESSMENT:    1. Precordial pain   2. Pre-procedure lab exam   3. Family history of heart disease   4. Cardiac risk counseling   5. Counseling on health promotion and disease prevention    PLAN:    Chest pain: -discussed treadmill stress, nuclear stress/lexiscan, and CT coronary angiography. Discussed pros and cons of each, including but not limited to false positive/false negative risk, radiation risk, and risk of IV contrast dye. Based on shared decision making, decision was made to pursue CT coronary angiography. -will give one time dose of metoprolol -counseled on need to get BMET prior to test, will order today -counseled on use of sublingual nitroglycerin and its importance to a good test  We discussed that if there is evidence of early plaque or cholesterol, prevention recommendation is for statins. -discussed pathology of cholesterol, how plaques form, that MI/CVA result commonly from acute plaque rupture and not gradual stenosis. Discussed mechanism of statin to both decrease plaque accumulation and stabilize plaque that is already present. Discussed that calcium is a marker for plaque, with decades of validated data regarding average amounts of calcium for age/gender/ethnicity, as well as value of calcium score for risk stratification  Family history of heart disease: Reviewed that this is independent of ASCVD risk score, reviewed cutoffs for premature CAD.  Hypertension: Goal <130/80, at goal today Continue amlodipine, lisinopril-HCTZ  Prior obesity, now s/p lap band and BMI 28 Very motivated for excellent lifestyle, very active  Cardiac risk counseling and prevention recommendations: -recommend heart healthy/Mediterranean diet, with whole grains, fruits, vegetable, fish, lean meats, nuts, and olive oil. Limit salt. -recommend moderate walking, 3-5 times/week for 30-50 minutes each session. Aim for at  least 150 minutes.week. Goal should be pace of 3 miles/hours, or walking 1.5 miles in 30 minutes -recommend avoidance of tobacco products. Avoid excess alcohol.  -ASCVD risk score: 3.1%. We did the calculator together and discussed at length.  Plan for follow up: 1 year or sooner PRN based on results of CT scan  Medication Adjustments/Labs and Tests Ordered: Current medicines are reviewed at length with the patient today.  Concerns regarding medicines are outlined above.  Orders Placed This Encounter  Procedures  . CT CORONARY MORPH W/CTA COR W/SCORE W/CA W/CM &/OR WO/CM  . CT CORONARY FRACTIONAL FLOW RESERVE DATA PREP  . CT CORONARY FRACTIONAL FLOW RESERVE FLUID ANALYSIS  . Basic metabolic panel  . EKG 12-Lead   Meds ordered this encounter  Medications  . metoprolol tartrate (LOPRESSOR) 25 MG tablet  Sig: TAKE 1 TABLET 2 HR PRIOR TO CARDIAC PROCEDURE    Dispense:  1 tablet    Refill:  0    Patient Instructions  Medication Instructions:  Your Physician recommend you continue on your current medication as directed.    *If you need a refill on your cardiac medications before your next appointment, please call your pharmacy*  Lab Work: Your physician recommends that you return for lab work today (BMP)  If you have labs (blood work) drawn today and your tests are completely normal, you will receive your results only by: Marland Kitchen MyChart Message (if you have MyChart) OR . A paper copy in the mail If you have any lab test that is abnormal or we need to change your treatment, we will call you to review the results.  Testing/Procedures: Cardiac CTA  Follow-Up: At [facility], you and your health needs are our priority.  As part of our continuing mission to provide you with exceptional heart care, we have created designated Provider Care Teams.  These Care Teams include your primary Cardiologist (physician) and Advanced Practice Providers (APPs -  Physician Assistants and Nurse  Practitioners) who all work together to provide you with the care you need, when you need it.  Your next appointment:   12 months  The format for your next appointment:   In Person  Provider:   Jodelle Red, MD  Your cardiac CT will be scheduled at one of the below locations:   [facility] [address] [place], [place] [address] [phone]  If scheduled at [facility], please arrive at the [facility] main entrance of [facility] 30-45 minutes prior to test start time. Proceed to the [facility] Radiology Department (first floor) to check-in and test prep.  If scheduled at [facility], please arrive 15 mins early for check-in and test prep.  Please follow these instructions carefully (unless otherwise directed):   On the Night Before the Test: . Be sure to Drink plenty of water. . Do not consume any caffeinated/decaffeinated beverages or chocolate 12 hours prior to your test. . Do not take any antihistamines 12 hours prior to your test.  On the Day of the Test: . Drink plenty of water. Do not drink any water within one hour of the test. . Do not eat any food 4 hours prior to the test. . You may take your regular medications prior to the test.  . Take metoprolol (Lopressor) two hours prior to test. . HOLD Furosemide/Hydrochlorothiazide morning of the test. . FEMALES- please wear underwire-free bra if available         After the Test: . Drink plenty of water. . After receiving IV contrast, you may experience a mild flushed feeling. This is normal. . On occasion, you may experience a mild rash up to 24 hours after the test. This is not dangerous. If this occurs, you can take Benadryl 25 mg and increase your fluid intake. . If you experience trouble breathing, this can be serious. If it is severe call 911 IMMEDIATELY. If it is mild, please call our office. . If you take any of these medications:  Glipizide/Metformin, Avandament, Glucavance, please do not take 48 hours after completing test unless otherwise instructed.    Please contact the cardiac imaging nurse navigator should you have any questions/concerns Rockwell Alexandria, RN Navigator Cardiac Imaging [facility] [phone] Office      Signed, Samantha Red, MD  PhD 01/09/2019 12:41 PM    [facility]

## 2019-01-09 NOTE — Patient Instructions (Signed)
Medication Instructions:  Your Physician recommend you continue on your current medication as directed.    *If you need a refill on your cardiac medications before your next appointment, please call your pharmacy*  Lab Work: Your physician recommends that you return for lab work today (BMP)  If you have labs (blood work) drawn today and your tests are completely normal, you will receive your results only by: Marland Kitchen MyChart Message (if you have MyChart) OR . A paper copy in the mail If you have any lab test that is abnormal or we need to change your treatment, we will call you to review the results.  Testing/Procedures: Cardiac CTA  Follow-Up: At [facility], you and your health needs are our priority.  As part of our continuing mission to provide you with exceptional heart care, we have created designated Provider Care Teams.  These Care Teams include your primary Cardiologist (physician) and Advanced Practice Providers (APPs -  Physician Assistants and Nurse Practitioners) who all work together to provide you with the care you need, when you need it.  Your next appointment:   12 months  The format for your next appointment:   In Person  Provider:   Buford Dresser, MD  Your cardiac CT will be scheduled at one of the below locations:   [facility] [address] [place], [place] [address] [phone]  If scheduled at [facility], please arrive at the [facility] main entrance of [facility] 30-45 minutes prior to test start time. Proceed to the [facility] Radiology Department (first floor) to check-in and test prep.  If scheduled at [facility], please arrive 15 mins early for check-in and test prep.  Please follow these instructions carefully (unless otherwise directed):   On the Night Before the Test: . Be sure to Drink plenty of water. . Do not consume any caffeinated/decaffeinated beverages or chocolate 12  hours prior to your test. . Do not take any antihistamines 12 hours prior to your test.  On the Day of the Test: . Drink plenty of water. Do not drink any water within one hour of the test. . Do not eat any food 4 hours prior to the test. . You may take your regular medications prior to the test.  . Take metoprolol (Lopressor) two hours prior to test. . HOLD Furosemide/Hydrochlorothiazide morning of the test. . FEMALES- please wear underwire-free bra if available         After the Test: . Drink plenty of water. . After receiving IV contrast, you may experience a mild flushed feeling. This is normal. . On occasion, you may experience a mild rash up to 24 hours after the test. This is not dangerous. If this occurs, you can take Benadryl 25 mg and increase your fluid intake. . If you experience trouble breathing, this can be serious. If it is severe call 911 IMMEDIATELY. If it is mild, please call our office. . If you take any of these medications: Glipizide/Metformin, Avandament, Glucavance, please do not take 48 hours after completing test unless otherwise instructed.    Please contact the cardiac imaging nurse navigator should you have any questions/concerns Marchia Bond, RN Navigator Cardiac Imaging [facility] [phone] Office

## 2019-01-10 LAB — BASIC METABOLIC PANEL
BUN/Creatinine Ratio: 18 (ref 9–23)
BUN: 15 mg/dL (ref 6–24)
CO2: 24 mmol/L (ref 20–29)
Calcium: 10 mg/dL (ref 8.7–10.2)
Chloride: 107 mmol/L — ABNORMAL HIGH (ref 96–106)
Creatinine, Ser: 0.85 mg/dL (ref 0.57–1.00)
GFR calc Af Amer: 87 mL/min/{1.73_m2} (ref >59)
GFR calc non Af Amer: 76 mL/min/{1.73_m2} (ref >59)
Glucose: 80 mg/dL (ref 65–99)
Potassium: 3.9 mmol/L (ref 3.5–5.2)
Sodium: 144 mmol/L (ref 134–144)

## 2019-01-11 ENCOUNTER — Telehealth: Payer: Self-pay | Admitting: Cardiology

## 2019-01-11 NOTE — Telephone Encounter (Signed)
Routed to primary nurse 

## 2019-01-11 NOTE — Telephone Encounter (Signed)
Pt updated with lab results and verbalized understanding.  

## 2019-01-11 NOTE — Telephone Encounter (Signed)
Patient returning call regarding lab results  ?

## 2019-01-20 MED FILL — CEPHALEXIN 250 MG CAPSULE: 250 | 30 days supply | Qty: 30 | Fill #3

## 2019-01-20 MED FILL — LISINOPRIL-HCTZ 20-25 MG TA: 20-25 | 90 days supply | Qty: 90 | Fill #1

## 2019-01-20 MED FILL — ALPRAZolam 0.25 MG TABS: 0.25 | 30 days supply | Qty: 30 | Fill #3

## 2019-01-26 MED FILL — DESVENLAFAXINE SUC ER 50 MG: 50 | 90 days supply | Qty: 90 | Fill #1

## 2019-01-31 MED FILL — TOPIRAMATE 100 MG TABLET: 100 | 30 days supply | Qty: 60 | Fill #0

## 2019-02-01 MED FILL — OXYCODONE-APAP 10-325: 10-325 | 30 days supply | Qty: 90 | Fill #0

## 2019-02-08 ENCOUNTER — Ambulatory Visit (INDEPENDENT_AMBULATORY_CARE_PROVIDER_SITE_OTHER): Admitting: Licensed Clinical Social Worker

## 2019-02-08 DIAGNOSIS — M542 Cervicalgia: Secondary | ICD-10-CM | POA: Diagnosis not present

## 2019-02-08 DIAGNOSIS — F3341 Major depressive disorder, recurrent, in partial remission: Secondary | ICD-10-CM

## 2019-02-08 DIAGNOSIS — G5603 Carpal tunnel syndrome, bilateral upper limbs: Secondary | ICD-10-CM | POA: Diagnosis not present

## 2019-02-08 DIAGNOSIS — M5417 Radiculopathy, lumbosacral region: Secondary | ICD-10-CM | POA: Diagnosis not present

## 2019-02-08 DIAGNOSIS — G43009 Migraine without aura, not intractable, without status migrainosus: Secondary | ICD-10-CM | POA: Diagnosis not present

## 2019-02-08 DIAGNOSIS — G629 Polyneuropathy, unspecified: Secondary | ICD-10-CM | POA: Diagnosis not present

## 2019-02-08 DIAGNOSIS — F419 Anxiety disorder, unspecified: Secondary | ICD-10-CM | POA: Diagnosis not present

## 2019-02-09 ENCOUNTER — Other Ambulatory Visit: Payer: Self-pay | Admitting: Family Medicine

## 2019-02-09 DIAGNOSIS — Z1231 Encounter for screening mammogram for malignant neoplasm of breast: Secondary | ICD-10-CM

## 2019-02-21 MED FILL — CEPHALEXIN 250 MG CAPSULE: 250 | 30 days supply | Qty: 30 | Fill #4

## 2019-02-21 MED FILL — ALPRAZolam 0.25 MG TABS: 0.25 | 30 days supply | Qty: 30 | Fill #4

## 2019-02-28 ENCOUNTER — Encounter (HOSPITAL_COMMUNITY): Payer: Self-pay

## 2019-02-28 ENCOUNTER — Telehealth (HOSPITAL_COMMUNITY): Payer: Self-pay | Admitting: Emergency Medicine

## 2019-02-28 NOTE — Telephone Encounter (Signed)
Left message on voicemail with name and callback number Kimanh Templeman RN Navigator Cardiac Imaging [facility] [phone] Office [phone] Cell  

## 2019-03-01 ENCOUNTER — Other Ambulatory Visit: Payer: Self-pay

## 2019-03-01 ENCOUNTER — Ambulatory Visit (HOSPITAL_COMMUNITY)
Admission: RE | Admit: 2019-03-01 | Discharge: 2019-03-01 | Disposition: A | Discharge status: Home / Self Care | Payer: 59 | Source: Ambulatory Visit | Attending: Cardiology | Admitting: Cardiology

## 2019-03-01 DIAGNOSIS — R072 Precordial pain: Secondary | ICD-10-CM | POA: Insufficient documentation

## 2019-03-01 LAB — POCT I-STAT CREATININE: Creatinine, Ser: 0.7 mg/dL (ref 0.44–1.00)

## 2019-03-01 MED ORDER — NITROGLYCERIN 0.4 MG SL SUBL
0.8000 mg | SUBLINGUAL_TABLET | Freq: Once | SUBLINGUAL | Status: AC
  Administered 2019-03-01: 0.8 mg via SUBLINGUAL

## 2019-03-01 MED ORDER — IOHEXOL 350 MG/ML SOLN
80.0000 mL | Freq: Once | INTRAVENOUS | Status: AC | PRN
  Administered 2019-03-01: 80 mL via INTRAVENOUS

## 2019-03-01 MED ORDER — NITROGLYCERIN 0.4 MG SL SUBL
SUBLINGUAL_TABLET | SUBLINGUAL | Status: AC
  Filled 2019-03-01: qty 2

## 2019-03-01 MED FILL — OXYCODONE-APAP 10-325: 10-325 | 30 days supply | Qty: 90 | Fill #0

## 2019-03-03 ENCOUNTER — Telehealth: Payer: Self-pay | Admitting: Cardiology

## 2019-03-03 NOTE — Telephone Encounter (Signed)
° ° °  Please return call to patient with CT results °

## 2019-03-03 NOTE — Telephone Encounter (Signed)
Pt advised her CT results and verbalized understanding.. she was aware of her small VSD.

## 2019-03-06 MED FILL — TOPIRAMATE 100 MG TABLET: 100 | 30 days supply | Qty: 60 | Fill #1

## 2019-03-06 MED FILL — traZODone HCL 50 MG TABS: 50 | 90 days supply | Qty: 90 | Fill #0

## 2019-03-20 MED FILL — AMLODIPINE BESYLATE 10 MG T: 10 | 90 days supply | Qty: 90 | Fill #0

## 2019-03-28 MED FILL — CEPHALEXIN 250 MG CAPSULE: 250 | 30 days supply | Qty: 30 | Fill #5

## 2019-03-29 MED FILL — OXYCODONE-APAP 10-325: 10-325 | 30 days supply | Qty: 90 | Fill #0

## 2019-04-05 ENCOUNTER — Ambulatory Visit (INDEPENDENT_AMBULATORY_CARE_PROVIDER_SITE_OTHER): Payer: 59 | Admitting: Licensed Clinical Social Worker

## 2019-04-05 DIAGNOSIS — I10 Essential (primary) hypertension: Secondary | ICD-10-CM | POA: Diagnosis not present

## 2019-04-05 DIAGNOSIS — G4700 Insomnia, unspecified: Secondary | ICD-10-CM | POA: Diagnosis not present

## 2019-04-05 DIAGNOSIS — F3341 Major depressive disorder, recurrent, in partial remission: Secondary | ICD-10-CM | POA: Diagnosis not present

## 2019-04-05 DIAGNOSIS — E1169 Type 2 diabetes mellitus with other specified complication: Secondary | ICD-10-CM | POA: Diagnosis not present

## 2019-04-05 DIAGNOSIS — F320 Major depressive disorder, single episode, mild: Secondary | ICD-10-CM | POA: Diagnosis not present

## 2019-04-05 MED FILL — ALPRAZolam 0.25 MG TABS: 0.25 | 30 days supply | Qty: 30 | Fill #0

## 2019-04-07 DIAGNOSIS — Z7984 Long term (current) use of oral hypoglycemic drugs: Secondary | ICD-10-CM | POA: Diagnosis not present

## 2019-04-07 DIAGNOSIS — I10 Essential (primary) hypertension: Secondary | ICD-10-CM | POA: Diagnosis not present

## 2019-04-07 DIAGNOSIS — E1169 Type 2 diabetes mellitus with other specified complication: Secondary | ICD-10-CM | POA: Diagnosis not present

## 2019-04-12 ENCOUNTER — Ambulatory Visit
Admission: RE | Admit: 2019-04-12 | Discharge: 2019-04-12 | Disposition: A | Discharge status: Home / Self Care | Payer: 59 | Source: Ambulatory Visit | Attending: Family Medicine | Admitting: Family Medicine

## 2019-04-12 ENCOUNTER — Other Ambulatory Visit: Payer: Self-pay

## 2019-04-12 DIAGNOSIS — Z1231 Encounter for screening mammogram for malignant neoplasm of breast: Secondary | ICD-10-CM | POA: Diagnosis not present

## 2019-04-17 MED FILL — TOPIRAMATE 100 MG TABLET: 100 | 30 days supply | Qty: 60 | Fill #0

## 2019-04-26 MED FILL — OXYCODONE-APAP 10-325: 10-325 | 30 days supply | Qty: 90 | Fill #0

## 2019-04-26 MED FILL — OXYBUTYNIN CL ER 10 MG TAB: 10 | 90 days supply | Qty: 90 | Fill #2

## 2019-05-04 MED FILL — CEPHALEXIN 250 MG CAPSULE: 250 | 30 days supply | Qty: 30 | Fill #6

## 2019-05-05 MED FILL — DESVENLAFAXINE SUC ER 50 MG: 50 | 90 days supply | Qty: 90 | Fill #0

## 2019-05-08 MED FILL — ALPRAZolam 0.25 MG TABS: 0.25 | 30 days supply | Qty: 30 | Fill #1

## 2019-05-10 DIAGNOSIS — M542 Cervicalgia: Secondary | ICD-10-CM | POA: Diagnosis not present

## 2019-05-10 DIAGNOSIS — Z79899 Other long term (current) drug therapy: Secondary | ICD-10-CM | POA: Diagnosis not present

## 2019-05-10 DIAGNOSIS — G629 Polyneuropathy, unspecified: Secondary | ICD-10-CM | POA: Diagnosis not present

## 2019-05-10 DIAGNOSIS — M5417 Radiculopathy, lumbosacral region: Secondary | ICD-10-CM | POA: Diagnosis not present

## 2019-05-10 DIAGNOSIS — G43009 Migraine without aura, not intractable, without status migrainosus: Secondary | ICD-10-CM | POA: Diagnosis not present

## 2019-05-10 DIAGNOSIS — F419 Anxiety disorder, unspecified: Secondary | ICD-10-CM | POA: Diagnosis not present

## 2019-05-18 MED FILL — TOPIRAMATE 100 MG TABLET: 100 | 30 days supply | Qty: 60 | Fill #1

## 2019-05-18 MED FILL — LISINOPRIL-HCTZ 20-25 MG TA: 20-25 | 90 days supply | Qty: 90 | Fill #0

## 2019-05-25 MED FILL — OXYCODONE-APAP 10-325 MG TA: 10-325 | 30 days supply | Qty: 90 | Fill #0

## 2019-06-06 MED FILL — CEPHALEXIN 250 MG CAPSULE: 250 | 30 days supply | Qty: 30 | Fill #7

## 2019-06-06 MED FILL — ALPRAZolam 0.25 MG TABS: 0.25 | 30 days supply | Qty: 30 | Fill #2

## 2019-06-07 ENCOUNTER — Ambulatory Visit (INDEPENDENT_AMBULATORY_CARE_PROVIDER_SITE_OTHER): Payer: 59 | Admitting: Licensed Clinical Social Worker

## 2019-06-07 DIAGNOSIS — F3341 Major depressive disorder, recurrent, in partial remission: Secondary | ICD-10-CM

## 2019-06-20 MED FILL — AMLODIPINE BESYLATE 10 MG T: 10 | 90 days supply | Qty: 90 | Fill #0

## 2019-06-24 MED FILL — OXYCODONE-APAP 10-325: 10-325 | 30 days supply | Qty: 90 | Fill #0

## 2019-07-03 DIAGNOSIS — N390 Urinary tract infection, site not specified: Secondary | ICD-10-CM | POA: Diagnosis not present

## 2019-07-03 MED FILL — CIPROFLOXACIN HCL 500 MG TA: 500 | 5 days supply | Qty: 10 | Fill #0

## 2019-07-05 MED FILL — TOPIRAMATE 100 MG TABLET: 100 | 30 days supply | Qty: 60 | Fill #0

## 2019-07-17 MED FILL — CEPHALEXIN 250 MG CAPSULE: 250 | 30 days supply | Qty: 30 | Fill #8

## 2019-07-20 MED FILL — ALPRAZolam 0.25 MG TABS: 0.25 | 30 days supply | Qty: 30 | Fill #3

## 2019-07-24 MED FILL — OXYCODONE-APAP 10-325: 10-325 | 30 days supply | Qty: 90 | Fill #0

## 2019-08-07 MED FILL — OXYBUTYNIN CL ER 10 MG TAB: 10 | 90 days supply | Qty: 90 | Fill #3

## 2019-08-07 MED FILL — TOPIRAMATE 100 MG TABLET: 100 | 30 days supply | Qty: 60 | Fill #1

## 2019-08-07 MED FILL — DESVENLAFAXINE SUC ER 50 MG: 50 | 90 days supply | Qty: 90 | Fill #1

## 2019-08-15 ENCOUNTER — Other Ambulatory Visit: Payer: Self-pay

## 2019-08-15 NOTE — Progress Notes (Deleted)
59 y.o. [id number] Married Black or [place] American Not Hispanic or Latino female here for annual exam.      No LMP recorded. Patient has had a hysterectomy.          Sexually active: {yes no:314532}  The current method of family planning is {contraception:315051}.    Exercising: {yes no:314532}  {types:19826} Smoker:  {YES NO:22349}  Health Maintenance: Pap:  Unsure (hysterectomy) History of abnormal Pap:  no MMG:  1/20/21density b Bi-rads 1 neg  BMD:  never Colonoscopy: 08/27/10, f/u due in 22 TDaP:  *** Gardasil: NA   reports that she has never smoked. She has never used smokeless tobacco. She reports current alcohol use of about 1.0 standard drinks of alcohol per week. She reports that she does not use drugs.  Past Medical History:  Diagnosis Date  . Arthritis   . Arthritis    both knees  . Chronic leg pain   . Depression   . Diabetes mellitus without complication ([place])   . Hypertension   . Migraine without aura   . Morbid obesity ([place])   . Urinary incontinence   . Vitamin D deficiency disease 2013    Past Surgical History:  Procedure Laterality Date  . KNEE ARTHROSCOPY W/ ACL RECONSTRUCTION Left 2004  . LAPAROSCOPIC CHOLECYSTECTOMY  2005  . LAPAROSCOPIC GASTRIC BANDING  2007  . TONSILLECTOMY  1984   Age 27  . TOTAL VAGINAL HYSTERECTOMY  age 79   Ovaries remain, for menorrhagia  . TUBAL LIGATION      Current Outpatient Medications  Medication Sig Dispense Refill  . ALPRAZolam (XANAX) 0.25 MG tablet Take 0.25 mg by mouth at bedtime as needed for anxiety.    Marland Kitchen amLODipine (NORVASC) 10 MG tablet Take 10 mg by mouth daily.  1  . cephALEXin (KEFLEX) 250 MG capsule Take 250 mg by mouth at bedtime.  5  . Cholecalciferol (VITAMIN D) 2000 UNITS tablet Take 2,000 Units by mouth daily.      Marland Kitchen desvenlafaxine (PRISTIQ) 50 MG 24 hr tablet Take 50 mg by mouth daily.      Marland Kitchen lisinopril-hydrochlorothiazide (PRINZIDE,ZESTORETIC) 20-25 MG per tablet Take 1 tablet by mouth daily.       . metoprolol tartrate (LOPRESSOR) 25 MG tablet TAKE 1 TABLET 2 HR PRIOR TO CARDIAC PROCEDURE 1 tablet 0  . Multiple Vitamins-Minerals (MULTIVITAMIN WITH MINERALS) tablet Take 1 tablet by mouth daily.      Marland Kitchen nystatin cream (MYCOSTATIN) Apply 1 application topically 2 (two) times daily. Apply to affected area BID for up to 7 days. 30 g 0  . oxybutynin (DITROPAN-XL) 10 MG 24 hr tablet Take 1 tablet (10 mg total) by mouth at bedtime. 90 tablet 3  . oxyCODONE-acetaminophen (PERCOCET) 10-325 MG tablet Take 1 tablet by mouth every 8 (eight) hours as needed.  0  . topiramate (TOPAMAX) 100 MG tablet Take 100 mg by mouth 2 (two) times daily.  4  . traZODone (DESYREL) 50 MG tablet Take 50 mg by mouth at bedtime as needed for sleep.     No current facility-administered medications for this visit.    Family History  Problem Relation Age of Onset  . Hypertension Mother   . Rheum arthritis Mother        Lupus  . Heart disease Mother   . Lupus Mother   . Diabetes Father   . Hypertension Father   . Stroke Father   . Hyperlipidemia Father   . Heart disease Father   .  Lupus Brother   . Diabetes Brother   . Hyperlipidemia Brother   . Breast cancer Maternal Aunt 64       X 2, one in her 48's  . Lupus Maternal Aunt   . Breast cancer Cousin 26       maternal & paternal cousin  . Breast cancer Paternal Aunt 30       died in her 48's  . Lymphoma Son     Review of Systems  Exam:   There were no vitals taken for this visit.  Weight change: @WEIGHTCHANGE @ Height:      Ht Readings from Last 3 Encounters:  01/09/19 5\' 6"  (1.676 m)  10/29/18 5\' 6"  (1.676 m)  08/10/18 5\' 5"  (1.651 m)    General appearance: alert, cooperative and appears stated age Head: Normocephalic, without obvious abnormality, atraumatic Neck: no adenopathy, supple, symmetrical, trachea midline and thyroid {CHL AMB PHY EX THYROID NORM DEFAULT:[phone]::"normal to inspection and palpation"} Lungs: clear to auscultation  bilaterally Cardiovascular: regular rate and rhythm Breasts: {Exam; breast:13139::"normal appearance, no masses or tenderness"} Abdomen: soft, non-tender; non distended,  no masses,  no organomegaly Extremities: extremities normal, atraumatic, no cyanosis or edema Skin: Skin color, texture, turgor normal. No rashes or lesions Lymph nodes: Cervical, supraclavicular, and axillary nodes normal. No abnormal inguinal nodes palpated Neurologic: Grossly normal   Pelvic: External genitalia:  no lesions              Urethra:  normal appearing urethra with no masses, tenderness or lesions              Bartholins and Skenes: normal                 Vagina: normal appearing vagina with normal color and discharge, no lesions              Cervix: {CHL AMB PHY EX CERVIX NORM DEFAULT:[phone]::"no lesions"}               Bimanual Exam:  Uterus:  {CHL AMB PHY EX UTERUS NORM DEFAULT:[phone]::"normal size, contour, position, consistency, mobility, non-tender"}              Adnexa: {CHL AMB PHY EX ADNEXA NO MASS DEFAULT:[phone]::"no mass, fullness, tenderness"}               Rectovaginal: Confirms               Anus:  normal sphincter tone, no lesions  *** chaperoned for the exam.  A:  Well Woman with normal exam  P:

## 2019-08-16 ENCOUNTER — Ambulatory Visit: Payer: 59 | Admitting: Obstetrics and Gynecology

## 2019-08-22 MED FILL — ALPRAZolam 0.25 MG TABS: 0.25 | 30 days supply | Qty: 30 | Fill #4

## 2019-08-22 MED FILL — CEPHALEXIN 250 MG CAPSULE: 250 | 30 days supply | Qty: 30 | Fill #9

## 2019-08-22 MED FILL — LISINOPRIL-HCTZ 20-25 MG TA: 20-25 | 90 days supply | Qty: 90 | Fill #1

## 2019-08-23 MED FILL — OXYCODONE-ACETAMINOPHEN 10-: 10-325 | 30 days supply | Qty: 90 | Fill #0

## 2019-09-06 MED FILL — TOPIRAMATE 100 MG TABLET: 100 | 30 days supply | Qty: 60 | Fill #2

## 2019-09-15 ENCOUNTER — Other Ambulatory Visit (HOSPITAL_COMMUNITY): Payer: Self-pay | Admitting: Urology

## 2019-09-15 DIAGNOSIS — R350 Frequency of micturition: Secondary | ICD-10-CM | POA: Diagnosis not present

## 2019-09-15 DIAGNOSIS — R310 Gross hematuria: Secondary | ICD-10-CM | POA: Diagnosis not present

## 2019-09-15 MED FILL — CEPHALEXIN 250 MG CAPSULE: 250 | 90 days supply | Qty: 90 | Fill #0

## 2019-09-20 MED FILL — AMLODIPINE BESYLATE 10 MG T: 10 | 90 days supply | Qty: 90 | Fill #0

## 2019-09-22 MED FILL — OXYCODONE-APAP 10-325: 10-325 | 30 days supply | Qty: 90 | Fill #0

## 2019-10-04 ENCOUNTER — Ambulatory Visit (INDEPENDENT_AMBULATORY_CARE_PROVIDER_SITE_OTHER): Payer: 59 | Admitting: Cardiology

## 2019-10-04 ENCOUNTER — Other Ambulatory Visit: Payer: Self-pay

## 2019-10-04 ENCOUNTER — Telehealth: Payer: Self-pay | Admitting: Radiology

## 2019-10-04 ENCOUNTER — Encounter: Payer: Self-pay | Admitting: Cardiology

## 2019-10-04 VITALS — BP 115/76 | HR 73 | Temp 97.0°F | Ht 66.0 in | Wt 188.4 lb

## 2019-10-04 DIAGNOSIS — I10 Essential (primary) hypertension: Secondary | ICD-10-CM | POA: Diagnosis not present

## 2019-10-04 DIAGNOSIS — Q210 Ventricular septal defect: Secondary | ICD-10-CM | POA: Diagnosis not present

## 2019-10-04 DIAGNOSIS — Z8249 Family history of ischemic heart disease and other diseases of the circulatory system: Secondary | ICD-10-CM | POA: Diagnosis not present

## 2019-10-04 DIAGNOSIS — R002 Palpitations: Secondary | ICD-10-CM | POA: Diagnosis not present

## 2019-10-04 DIAGNOSIS — I2510 Atherosclerotic heart disease of native coronary artery without angina pectoris: Secondary | ICD-10-CM

## 2019-10-04 DIAGNOSIS — Z7189 Other specified counseling: Secondary | ICD-10-CM | POA: Diagnosis not present

## 2019-10-04 NOTE — Telephone Encounter (Signed)
Enrolled patient for a 7 day Zio monitor to be mailed to patients home.  

## 2019-10-04 NOTE — Patient Instructions (Signed)
Medication Instructions:  Your Physician recommend you continue on your current medication as directed.    *If you need a refill on your cardiac medications before your next appointment, please call your pharmacy*   Lab Work: None  Testing/Procedures: Your physician has requested that you have an echocardiogram. Echocardiography is a painless test that uses sound waves to create images of your heart. It provides your doctor with information about the size and shape of your heart and how well your heart's chambers and valves are working. This procedure takes approximately one hour. There are no restrictions for this procedure. [address]  Our physician has recommended that you wear an 7  DAY ZIO-PATCH monitor. The Zio patch cardiac monitor continuously records heart rhythm data for up to 14 days, this is for patients being evaluated for multiple types heart rhythms. For the first 24 hours post application, please avoid getting the Zio monitor wet in the shower or by excessive sweating during exercise. After that, feel free to carry on with regular activities. Keep soaps and lotions away from the ZIO XT Patch.   Someone from our office will call to verify address prior to mailing monitor.     Follow-Up: At [facility], you and your health needs are our priority.  As part of our continuing mission to provide you with exceptional heart care, we have created designated Provider Care Teams.  These Care Teams include your primary Cardiologist (physician) and Advanced Practice Providers (APPs -  Physician Assistants and Nurse Practitioners) who all work together to provide you with the care you need, when you need it.  We recommend signing up for the patient portal called "MyChart".  Sign up information is provided on this After Visit Summary.  MyChart is used to connect with patients for Virtual Visits (Telemedicine).  Patients are able to view lab/test results, encounter  notes, upcoming appointments, etc.  Non-urgent messages can be sent to your provider as well.   To learn more about what you can do with MyChart, go to [email or web address].    Your next appointment:   6-8 week(s)  The format for your next appointment:   In Person  Provider:   Jodelle Red, MD  Christena Deem- Long Term Monitor Instructions   Your physician has requested you wear your ZIO patch monitor___7____days.   This is a single patch monitor.  Irhythm supplies one patch monitor per enrollment.  Additional stickers are not available.   Please do not apply patch if you will be having a Nuclear Stress Test, Echocardiogram, Cardiac CT, MRI, or Chest Xray during the time frame you would be wearing the monitor. The patch cannot be worn during these tests.  You cannot remove and re-apply the ZIO XT patch monitor.   Your ZIO patch monitor will be sent USPS Priority mail from [facility] directly to your home address. The monitor may also be mailed to a PO BOX if home delivery is not available.   It may take 3-5 days to receive your monitor after you have been enrolled.   Once you have received you monitor, please review enclosed instructions.  Your monitor has already been registered assigning a specific monitor serial # to you.   Applying the monitor   Shave hair from upper left chest.   Hold abrader disc by orange tab.  Rub abrader in 40 strokes over left upper chest as indicated in your monitor instructions.   Clean area with 4 enclosed alcohol pads .  Use all pads to assure are is cleaned thoroughly.  Let dry.   Apply patch as indicated in monitor instructions.  Patch will be place under collarbone on left side of chest with arrow pointing upward.   Rub patch adhesive wings for 2 minutes.Remove white label marked "1".  Remove white label marked "2".  Rub patch adhesive wings for 2 additional minutes.   While looking in a mirror, press and release button in center of  patch.  A small green light will flash 3-4 times .  This will be your only indicator the monitor has been turned on.     Do not shower for the first 24 hours.  You may shower after the first 24 hours.   Press button if you feel a symptom. You will hear a small click.  Record Date, Time and Symptom in the Patient Log Book.   When you are ready to remove patch, follow instructions on last 2 pages of Patient Log Book.  Stick patch monitor onto last page of Patient Log Book.   Place Patient Log Book in [place] box.  Use locking tab on box and tape box closed securely.  The Orange and [employer] has [employer] on it.  Please place in mailbox as soon as possible.  Your physician should have your test results approximately 7 days after the monitor has been mailed back to [facility].   Call [facility] Customer Care at [phone] if you have questions regarding your ZIO XT patch monitor.  Call them immediately if you see an orange light blinking on your monitor.   If your monitor falls off in less than 4 days contact our Monitor department at [phone].  If your monitor becomes loose or falls off after 4 days call [employer] at [phone] for suggestions on securing your monitor.

## 2019-10-04 NOTE — Progress Notes (Signed)
Cardiology Office Note:    Date:  10/04/2019   ID:  Samantha Molina, DOB [birth year], MRN [id number]  PCP:  Donald Prose, MD  Cardiologist:  Buford Dresser, MD  Referring MD: Donald Prose, MD   CC: follow up  History of Present Illness:    Samantha Molina is a 59 y.o. female with a hx of hypertension, depression/anxiety who is seen for follow up. I initially met her 01/09/19 as a new consult at the request of Donald Prose, MD for the evaluation and management of chest pain.  Chest pain history: had full workup and was told everything was fine. Happened when she was living in [place], was 300 lbs at the time. Had lap-band done in 2007, lost a huge amount weight. Has been vigilant about diet and exercise. Had been fine with no further chest pain until 10/28/18, sudden onset around 1130 PM. Aching, substernal, associated with shortness of breath and right jaw pain. Seen in ER 10/29/18 when pain had been constant for several hours. D dimer elevated but CTPE negative. hsTn 3 -> 3. ECG nonischemic. CT cardiac with mild nonobstructive disease 02/2019.  Today: Lots of stress with her husband, received kidney transplant in March but has had recurrent complications/hospitalizations since then. Her mother had Covid and is still on oxygen, and she is also the caregiver for her brother.  Got to goal weight, but regained with her husband's medical issues. Was weightlifting with a trainer.  Had an episode last week. Woke her up from sleep, felt like her heart was racing. Was after she had been lifting with a trainer in the days prior. No further episodes. No syncope.   Denies change in chest pain, denies shortness of breath at rest or with normal exertion. No PND, orthopnea, LE edema or unexpected weight gain. No syncope.  Past Medical History:  Diagnosis Date  . Arthritis   . Arthritis    both knees  . Chronic leg pain   . Depression   . Diabetes mellitus without complication ([place])   .  Hypertension   . Migraine without aura   . Morbid obesity ([place])   . Urinary incontinence   . Vitamin D deficiency disease 2013    Past Surgical History:  Procedure Laterality Date  . KNEE ARTHROSCOPY W/ ACL RECONSTRUCTION Left 2004  . LAPAROSCOPIC CHOLECYSTECTOMY  2005  . LAPAROSCOPIC GASTRIC BANDING  2007  . TONSILLECTOMY  1984   Age 19  . TOTAL VAGINAL HYSTERECTOMY  age 34   Ovaries remain, for menorrhagia  . TUBAL LIGATION      Current Medications: Current Outpatient Medications on File Prior to Visit  Medication Sig  . ALPRAZolam (XANAX) 0.25 MG tablet Take 0.25 mg by mouth at bedtime as needed for anxiety.  Marland Kitchen amLODipine (NORVASC) 10 MG tablet Take 10 mg by mouth daily.  . cephALEXin (KEFLEX) 250 MG capsule Take 250 mg by mouth at bedtime.  . Cholecalciferol (VITAMIN D) 2000 UNITS tablet Take 2,000 Units by mouth daily.    Marland Kitchen desvenlafaxine (PRISTIQ) 50 MG 24 hr tablet Take 50 mg by mouth daily.    Marland Kitchen lisinopril-hydrochlorothiazide (PRINZIDE,ZESTORETIC) 20-25 MG per tablet Take 1 tablet by mouth daily.    . metoprolol tartrate (LOPRESSOR) 25 MG tablet TAKE 1 TABLET 2 HR PRIOR TO CARDIAC PROCEDURE  . Multiple Vitamins-Minerals (MULTIVITAMIN WITH MINERALS) tablet Take 1 tablet by mouth daily.    Marland Kitchen nystatin cream (MYCOSTATIN) Apply 1 application topically 2 (two) times daily. Apply to affected area  BID for up to 7 days.  Marland Kitchen oxybutynin (DITROPAN-XL) 10 MG 24 hr tablet Take 1 tablet (10 mg total) by mouth at bedtime.  Marland Kitchen oxyCODONE-acetaminophen (PERCOCET) 10-325 MG tablet Take 1 tablet by mouth every 8 (eight) hours as needed.  . topiramate (TOPAMAX) 100 MG tablet Take 100 mg by mouth 2 (two) times daily.  . traZODone (DESYREL) 50 MG tablet Take 50 mg by mouth at bedtime as needed for sleep.   No current facility-administered medications on file prior to visit.     Allergies:   Patient has no known allergies.   Social History   Tobacco Use  . Smoking status: Never Smoker  .  Smokeless tobacco: Never Used  Vaping Use  . Vaping Use: Never used  Substance Use Topics  . Alcohol use: Yes    Alcohol/week: 1.0 standard drink    Types: 1 Standard drinks or equivalent per week    Comment: rarely  . Drug use: No    Family History: family history includes Breast cancer (age of onset: 52) in her paternal aunt; Breast cancer (age of onset: 51) in her cousin; Breast cancer (age of onset: 21) in her maternal aunt; Diabetes in her brother and father; Heart disease in her father and mother; Hyperlipidemia in her brother and father; Hypertension in her father and mother; Lupus in her brother, maternal aunt, and mother; Lymphoma in her son; Rheum arthritis in her mother; Stroke in her father.  Family history: father passed away from MI at age 81. Brother died age 81 from lupus/kidney disease, was on dialysis. Mother: alive age 44, has heart disease, hypertension, arthritis Father: deceased age 11, heart disease, diabetes, hypertension Mat Gma: heart disease, hypertension Mat Gpa: heart disease, hypertension Pat Gma: cancer Pat Gpa: diabetes, heart disease, hypertension Brother: alive age 56, obese, in SNF  ROS:   Please see the history of present illness.  Additional pertinent ROS otherwise unremarkable.  EKGs/Labs/Other Studies Reviewed:    The following studies were reviewed today: CT cardiac 03/01/19 Coronary calcium score: The patient's coronary artery calcium score is 114, which places the patient in the 94th percentile.  Coronary arteries: Normal coronary origins.  Right dominance.  Right Coronary Artery: Normal caliber vessel, gives rise to PDA. Mild ostial noncalcified plaque with 1-24% stenosis.  Left Main Coronary Artery: Normal caliber vessel. No significant plaque or stenosis.  Left Anterior Descending Coronary Artery: Normal caliber vessel. Proximal LAD has focal calcified plaque with 25-49% luminal stenosis. Gives rise to 3 diagonal branches. Distal  LAD wraps apex  Left Circumflex Artery: Normal caliber vessel. No significant plaque or stenosis. Gives rise to 1 OM branch.  Aorta: Normal size, 31 mm at the mid ascending aorta (level of the PA bifurcation) measured double oblique. Trivial calcifications. No dissection.  Aortic Valve: No calcifications. Trileaflet.  Other findings: Normal pulmonary vein drainage into the left atrium. Normal left atrial appendage without a thrombus. Normal size of the pulmonary artery. Appears to have a small VSD, likely perimembranous.  IMPRESSION: 1. Mild nonobstructive CAD, CADRADS = 2. 2. Coronary calcium score of 114. This was 94th percentile for age and sex matched control. 3. Normal coronary origin with right dominance. 4. Likely perimembranous VSD.  EKG:  EKG is personally reviewed.  The ekg ordered today demonstrates NSR at 73 bpm  Recent Labs: 10/29/2018: Hemoglobin 12.9; Platelets 350 01/09/2019: BUN 15; Potassium 3.9; Sodium 144 03/01/2019: Creatinine, Ser 0.70  Recent Lipid Panel No results found for: CHOL, TRIG, HDL, CHOLHDL, VLDL,  LDLCALC, LDLDIRECT  Physical Exam:    VS:  BP 115/76   Pulse 73   Temp (!) 97 F (36.1 C)   Ht 5' 6"  (1.676 m)   Wt 188 lb 6.4 oz (85.5 kg)   SpO2 95%   BMI 30.41 kg/m     Wt Readings from Last 3 Encounters:  01/09/19 179 lb 3.2 oz (81.3 kg)  10/29/18 180 lb (81.6 kg)  08/10/18 190 lb 3.2 oz (86.3 kg)    GEN: Well nourished, well developed in no acute distress HEENT: Normal, moist mucous membranes NECK: No JVD CARDIAC: regular rhythm, normal S1 and S2, no rubs or gallops. No murmur. VASCULAR: Radial and DP pulses 2+ bilaterally. No carotid bruits RESPIRATORY:  Clear to auscultation without rales, wheezing or rhonchi  ABDOMEN: Soft, non-tender, non-distended MUSCULOSKELETAL:  Ambulates independently SKIN: Warm and dry, no edema NEUROLOGIC:  Alert and oriented x 3. No focal neuro deficits noted. PSYCHIATRIC:  Normal affect    ASSESSMENT:    1. Heart palpitations   2. Nonocclusive coronary atherosclerosis of native coronary artery   3. Essential hypertension   4. Perimembranous ventricular septal defect   5. Family history of heart disease   6. Cardiac risk counseling   7. Counseling on health promotion and disease prevention    PLAN:    Chest pain, nonocclusive coronary artery disease -has had workup, does not appear to be ischemic in etiology -counseled on red flag warning signs that need immediate medical attention -have discussed aspirin, statin  Palpitations -we discussed the potential causes of fast heart rates and palpitations today. Reviewed the normal electrical system of the heart. Reviewed the role of the sinus node. Reviewed the balance between resting (vagal) tone and fight or flight nervous system input. Reviewed how exercise improves vagal tone and lowers resting heart rate. Reviewed that sinus tachycardia, or elevated sinus rate, is usually secondary to something else in the body. This can include pain, stress, infection, anxiety, hormone imbalance, low blood counts, etc. Discussed that we do not typically treat sinus tachycardia by itself, and instead the focus is on finding what is driving the heart rate and treating that. We discussed that there can be other rhythm issues, from either the top or bottom of the heart, that are abnormal rhythms. Discussed how we evaluate for these. -discussed both Zio and Kardiamobile. Will plan for 7 day zio, kardia if expensive -will order echocardiogram.  Family history of heart disease: Reviewed that this is independent of ASCVD risk score, reviewed cutoffs for premature CAD.  Hypertension: Goal <130/80, at goal today Continue amlodipine, lisinopril-HCTZ  Prior obesity, now s/p lap band and BMI 28 Very motivated for excellent lifestyle, very active  Perimembranous VSD: -seen on CT, no murmur or symptoms  Cardiac risk counseling and prevention  recommendations: -recommend heart healthy/Mediterranean diet, with whole grains, fruits, vegetable, fish, lean meats, nuts, and olive oil. Limit salt. -recommend moderate walking, 3-5 times/week for 30-50 minutes each session. Aim for at least 150 minutes.week. Goal should be pace of 3 miles/hours, or walking 1.5 miles in 30 minutes -recommend avoidance of tobacco products. Avoid excess alcohol.  Plan for follow up: 6-8 weeks to review symptoms, monitor, echo  Medication Adjustments/Labs and Tests Ordered: Current medicines are reviewed at length with the patient today.  Concerns regarding medicines are outlined above.  Orders Placed This Encounter  Procedures  . LONG TERM MONITOR (3-14 DAYS)  . EKG 12-Lead  . ECHOCARDIOGRAM COMPLETE   No orders of the  defined types were placed in this encounter.   Patient Instructions  Medication Instructions:  Your Physician recommend you continue on your current medication as directed.    *If you need a refill on your cardiac medications before your next appointment, please call your pharmacy*   Lab Work: None  Testing/Procedures: Your physician has requested that you have an echocardiogram. Echocardiography is a painless test that uses sound waves to create images of your heart. It provides your doctor with information about the size and shape of your heart and how well your heart's chambers and valves are working. This procedure takes approximately one hour. There are no restrictions for this procedure. [address]  Our physician has recommended that you wear an 7  DAY ZIO-PATCH monitor. The Zio patch cardiac monitor continuously records heart rhythm data for up to 14 days, this is for patients being evaluated for multiple types heart rhythms. For the first 24 hours post application, please avoid getting the Zio monitor wet in the shower or by excessive sweating during exercise. After that, feel free to carry on with regular  activities. Keep soaps and lotions away from the ZIO XT Patch.   Someone from our office will call to verify address prior to mailing monitor.     Follow-Up: At [facility], you and your health needs are our priority.  As part of our continuing mission to provide you with exceptional heart care, we have created designated Provider Care Teams.  These Care Teams include your primary Cardiologist (physician) and Advanced Practice Providers (APPs -  Physician Assistants and Nurse Practitioners) who all work together to provide you with the care you need, when you need it.  We recommend signing up for the patient portal called "MyChart".  Sign up information is provided on this After Visit Summary.  MyChart is used to connect with patients for Virtual Visits (Telemedicine).  Patients are able to view lab/test results, encounter notes, upcoming appointments, etc.  Non-urgent messages can be sent to your provider as well.   To learn more about what you can do with MyChart, go to [email or web address].    Your next appointment:   6-8 week(s)  The format for your next appointment:   In Person  Provider:   Buford Dresser, MD  [place] Instructions   Your physician has requested you wear your ZIO patch monitor___7____days.   This is a single patch monitor.  Irhythm supplies one patch monitor per enrollment.  Additional stickers are not available.   Please do not apply patch if you will be having a Nuclear Stress Test, Echocardiogram, Cardiac CT, MRI, or Chest Xray during the time frame you would be wearing the monitor. The patch cannot be worn during these tests.  You cannot remove and re-apply the ZIO XT patch monitor.   Your ZIO patch monitor will be sent USPS Priority mail from [facility] directly to your home address. The monitor may also be mailed to a PO BOX if home delivery is not available.   It may take 3-5 days to receive your monitor after you have  been enrolled.   Once you have received you monitor, please review enclosed instructions.  Your monitor has already been registered assigning a specific monitor serial # to you.   Applying the monitor   Shave hair from upper left chest.   Hold abrader disc by orange tab.  Rub abrader in 40 strokes over left upper chest as indicated  in your monitor instructions.   Clean area with 4 enclosed alcohol pads .  Use all pads to assure are is cleaned thoroughly.  Let dry.   Apply patch as indicated in monitor instructions.  Patch will be place under collarbone on left side of chest with arrow pointing upward.   Rub patch adhesive wings for 2 minutes.Remove white label marked "1".  Remove white label marked "2".  Rub patch adhesive wings for 2 additional minutes.   While looking in a mirror, press and release button in center of patch.  A small green light will flash 3-4 times .  This will be your only indicator the monitor has been turned on.     Do not shower for the first 24 hours.  You may shower after the first 24 hours.   Press button if you feel a symptom. You will hear a small click.  Record Date, Time and Symptom in the Patient Log Book.   When you are ready to remove patch, follow instructions on last 2 pages of Patient Log Book.  Stick patch monitor onto last page of Patient Log Book.   Place Patient Log Book in [place] box.  Use locking tab on box and tape box closed securely.  The Orange and [employer] has [employer] on it.  Please place in mailbox as soon as possible.  Your physician should have your test results approximately 7 days after the monitor has been mailed back to [facility].   Call [place] at [phone] if you have questions regarding your ZIO XT patch monitor.  Call them immediately if you see an orange light blinking on your monitor.   If your monitor falls off in less than 4 days contact our Monitor department at [phone].  If your  monitor becomes loose or falls off after 4 days call [employer] at [phone] for suggestions on securing your monitor.         Signed, Buford Dresser, MD PhD 10/04/2019 2:44 PM    [place]

## 2019-10-09 MED FILL — TOPIRAMATE 100 MG TABLET: 100 | 30 days supply | Qty: 60 | Fill #0

## 2019-10-09 MED FILL — ALPRAZolam 0.25 MG TABS: 0.25 | 30 days supply | Qty: 30 | Fill #0

## 2019-10-11 ENCOUNTER — Ambulatory Visit: Payer: 59 | Admitting: Psychology

## 2019-10-11 ENCOUNTER — Other Ambulatory Visit (INDEPENDENT_AMBULATORY_CARE_PROVIDER_SITE_OTHER): Payer: 59

## 2019-10-11 DIAGNOSIS — R002 Palpitations: Secondary | ICD-10-CM | POA: Diagnosis not present

## 2019-10-17 ENCOUNTER — Other Ambulatory Visit (HOSPITAL_COMMUNITY): Payer: Self-pay | Admitting: Family Medicine

## 2019-10-17 DIAGNOSIS — Z23 Encounter for immunization: Secondary | ICD-10-CM | POA: Diagnosis not present

## 2019-10-17 DIAGNOSIS — E1169 Type 2 diabetes mellitus with other specified complication: Secondary | ICD-10-CM | POA: Diagnosis not present

## 2019-10-17 DIAGNOSIS — I10 Essential (primary) hypertension: Secondary | ICD-10-CM | POA: Diagnosis not present

## 2019-10-17 DIAGNOSIS — F320 Major depressive disorder, single episode, mild: Secondary | ICD-10-CM | POA: Diagnosis not present

## 2019-10-17 DIAGNOSIS — G4700 Insomnia, unspecified: Secondary | ICD-10-CM | POA: Diagnosis not present

## 2019-10-21 MED FILL — OXYCODONE-APAP 10-325: 10-325 | 30 days supply | Qty: 90 | Fill #0

## 2019-10-24 ENCOUNTER — Other Ambulatory Visit: Payer: Self-pay

## 2019-10-24 ENCOUNTER — Ambulatory Visit (HOSPITAL_COMMUNITY): Payer: 59 | Attending: Cardiology

## 2019-10-24 DIAGNOSIS — R002 Palpitations: Secondary | ICD-10-CM | POA: Diagnosis not present

## 2019-10-24 DIAGNOSIS — Q210 Ventricular septal defect: Secondary | ICD-10-CM | POA: Insufficient documentation

## 2019-10-24 LAB — ECHOCARDIOGRAM COMPLETE
AR max vel: 1.98 cm2
AV Area VTI: 2.13 cm2
AV Area mean vel: 1.72 cm2
AV Mean grad: 7 mmHg
AV Peak grad: 15.4 mmHg
Ao pk vel: 1.96 m/s
Area-P 1/2: 2.58 cm2
S' Lateral: 2.25 cm

## 2019-10-30 DIAGNOSIS — R002 Palpitations: Secondary | ICD-10-CM | POA: Diagnosis not present

## 2019-10-31 DIAGNOSIS — Z79899 Other long term (current) drug therapy: Secondary | ICD-10-CM | POA: Diagnosis not present

## 2019-10-31 DIAGNOSIS — G43009 Migraine without aura, not intractable, without status migrainosus: Secondary | ICD-10-CM | POA: Diagnosis not present

## 2019-10-31 DIAGNOSIS — M542 Cervicalgia: Secondary | ICD-10-CM | POA: Diagnosis not present

## 2019-10-31 DIAGNOSIS — R200 Anesthesia of skin: Secondary | ICD-10-CM | POA: Diagnosis not present

## 2019-10-31 DIAGNOSIS — F419 Anxiety disorder, unspecified: Secondary | ICD-10-CM | POA: Diagnosis not present

## 2019-10-31 DIAGNOSIS — M5417 Radiculopathy, lumbosacral region: Secondary | ICD-10-CM | POA: Diagnosis not present

## 2019-11-07 ENCOUNTER — Ambulatory Visit (INDEPENDENT_AMBULATORY_CARE_PROVIDER_SITE_OTHER): Payer: 59 | Admitting: Psychology

## 2019-11-07 DIAGNOSIS — F419 Anxiety disorder, unspecified: Secondary | ICD-10-CM | POA: Diagnosis not present

## 2019-11-08 MED FILL — DESVENLAFAXINE SUC ER 50 MG: 50 | 90 days supply | Qty: 90 | Fill #0

## 2019-11-09 ENCOUNTER — Ambulatory Visit: Payer: 59 | Admitting: Obstetrics and Gynecology

## 2019-11-09 MED FILL — TOPIRAMATE 100 MG TABLET: 100 | 30 days supply | Qty: 60 | Fill #1

## 2019-11-13 ENCOUNTER — Other Ambulatory Visit: Payer: Self-pay | Admitting: Obstetrics and Gynecology

## 2019-11-13 MED FILL — OXYBUTYNIN CL ER 10 MG TAB: 10 | 30 days supply | Qty: 30 | Fill #0

## 2019-11-13 MED FILL — ALPRAZolam 0.25 MG TABS: 0.25 | 30 days supply | Qty: 30 | Fill #0

## 2019-11-13 NOTE — Telephone Encounter (Signed)
Medication refill request: Oxybutynin Last AEX:  08/10/18 JJ Next AEX: 11/29/19 Last MMG (if hormonal medication request): 04/12/19 BIRADS 1 negative/density b Refill authorized: today, please advise

## 2019-11-20 MED FILL — OXYCODONE-APAP 10-325: 10-325 | 30 days supply | Qty: 90 | Fill #0

## 2019-11-22 NOTE — Progress Notes (Deleted)
59 y.o. [id number] Married Black or [place] American Not Hispanic or Latino female here for annual exam.      No LMP recorded. Patient has had a hysterectomy.          Sexually active: {yes no:314532}  The current method of family planning is {contraception:315051}.    Exercising: {yes no:314532}  {types:19826} Smoker:  {YES NO:22349}  Health Maintenance: Pap:  unsure History of abnormal Pap:  no MMG:  04/12/19 Density B Bi-rads 1 neg  BMD:   Never  Colonoscopy: 08/27/10, f/u due in 22 TDaP:  Up to date per patient  Gardasil: NA   reports that she has never smoked. She has never used smokeless tobacco. She reports current alcohol use of about 1.0 standard drink of alcohol per week. She reports that she does not use drugs.  Past Medical History:  Diagnosis Date  . Arthritis   . Arthritis    both knees  . Chronic leg pain   . Depression   . Diabetes mellitus without complication (HCC)   . Hypertension   . Migraine without aura   . Morbid obesity (HCC)   . Urinary incontinence   . Vitamin D deficiency disease 2013    Past Surgical History:  Procedure Laterality Date  . KNEE ARTHROSCOPY W/ ACL RECONSTRUCTION Left 2004  . LAPAROSCOPIC CHOLECYSTECTOMY  2005  . LAPAROSCOPIC GASTRIC BANDING  2007  . TONSILLECTOMY  1984   Age 10  . TOTAL VAGINAL HYSTERECTOMY  age 35   Ovaries remain, for menorrhagia  . TUBAL LIGATION      Current Outpatient Medications  Medication Sig Dispense Refill  . ALPRAZolam (XANAX) 0.25 MG tablet Take 0.25 mg by mouth at bedtime as needed for anxiety.    Marland Kitchen amLODipine (NORVASC) 10 MG tablet Take 10 mg by mouth daily.  1  . cephALEXin (KEFLEX) 250 MG capsule Take 250 mg by mouth at bedtime.  5  . Cholecalciferol (VITAMIN D) 2000 UNITS tablet Take 2,000 Units by mouth daily.      Marland Kitchen desvenlafaxine (PRISTIQ) 50 MG 24 hr tablet Take 50 mg by mouth daily.      Marland Kitchen lisinopril-hydrochlorothiazide (PRINZIDE,ZESTORETIC) 20-25 MG per tablet Take 1 tablet by mouth  daily.      . metoprolol tartrate (LOPRESSOR) 25 MG tablet TAKE 1 TABLET 2 HR PRIOR TO CARDIAC PROCEDURE 1 tablet 0  . Multiple Vitamins-Minerals (MULTIVITAMIN WITH MINERALS) tablet Take 1 tablet by mouth daily.      Marland Kitchen nystatin cream (MYCOSTATIN) Apply 1 application topically 2 (two) times daily. Apply to affected area BID for up to 7 days. 30 g 0  . oxybutynin (DITROPAN-XL) 10 MG 24 hr tablet TAKE 1 TABLET (10 MG TOTAL) BY MOUTH AT BEDTIME. 30 tablet 0  . oxyCODONE-acetaminophen (PERCOCET) 10-325 MG tablet Take 1 tablet by mouth every 8 (eight) hours as needed.  0  . topiramate (TOPAMAX) 100 MG tablet Take 100 mg by mouth 2 (two) times daily.  4  . traZODone (DESYREL) 50 MG tablet Take 50 mg by mouth at bedtime as needed for sleep.     No current facility-administered medications for this visit.    Family History  Problem Relation Age of Onset  . Hypertension Mother   . Rheum arthritis Mother        Lupus  . Heart disease Mother   . Lupus Mother   . Diabetes Father   . Hypertension Father   . Stroke Father   . Hyperlipidemia Father   .  Heart disease Father   . Lupus Brother   . Diabetes Brother   . Hyperlipidemia Brother   . Breast cancer Maternal Aunt 64       X 2, one in her 83's  . Lupus Maternal Aunt   . Breast cancer Cousin 75       maternal & paternal cousin  . Breast cancer Paternal Aunt 30       died in her 74's  . Lymphoma Son     Review of Systems  Exam:   There were no vitals taken for this visit.  Weight change: @WEIGHTCHANGE @ Height:      Ht Readings from Last 3 Encounters:  10/04/19 5\' 6"  (1.676 m)  01/09/19 5\' 6"  (1.676 m)  10/29/18 5\' 6"  (1.676 m)    General appearance: alert, cooperative and appears stated age Head: Normocephalic, without obvious abnormality, atraumatic Neck: no adenopathy, supple, symmetrical, trachea midline and thyroid {CHL AMB PHY EX THYROID NORM [phone]::"normal to inspection and palpation"} Lungs: clear to  auscultation bilaterally Cardiovascular: regular rate and rhythm Breasts: {Exam; breast:13139::"normal appearance, no masses or tenderness"} Abdomen: soft, non-tender; non distended,  no masses,  no organomegaly Extremities: extremities normal, atraumatic, no cyanosis or edema Skin: Skin color, texture, turgor normal. No rashes or lesions Lymph nodes: Cervical, supraclavicular, and axillary nodes normal. No abnormal inguinal nodes palpated Neurologic: Grossly normal   Pelvic: External genitalia:  no lesions              Urethra:  normal appearing urethra with no masses, tenderness or lesions              Bartholins and Skenes: normal                 Vagina: normal appearing vagina with normal color and discharge, no lesions              Cervix: {CHL AMB PHY EX CERVIX NORM [phone]::"no lesions"}               Bimanual Exam:  Uterus:  {CHL AMB PHY EX UTERUS NORM DEFAULT:[id number]::"normal size, contour, position, consistency, mobility, non-tender"}              Adnexa: {CHL AMB PHY EX ADNEXA NO MASS DEFAULT:[phone]::"no mass, fullness, tenderness"}               Rectovaginal: Confirms               Anus:  normal sphincter tone, no lesions  *** chaperoned for the exam.  A:  Well Woman with normal exam  P:

## 2019-11-23 ENCOUNTER — Encounter: Payer: Self-pay | Admitting: Cardiology

## 2019-11-23 ENCOUNTER — Ambulatory Visit (INDEPENDENT_AMBULATORY_CARE_PROVIDER_SITE_OTHER): Payer: 59 | Admitting: Cardiology

## 2019-11-23 ENCOUNTER — Other Ambulatory Visit: Payer: Self-pay

## 2019-11-23 VITALS — BP 110/72 | HR 67 | Ht 66.0 in | Wt 185.0 lb

## 2019-11-23 DIAGNOSIS — R0789 Other chest pain: Secondary | ICD-10-CM

## 2019-11-23 DIAGNOSIS — I2510 Atherosclerotic heart disease of native coronary artery without angina pectoris: Secondary | ICD-10-CM | POA: Diagnosis not present

## 2019-11-23 DIAGNOSIS — I10 Essential (primary) hypertension: Secondary | ICD-10-CM

## 2019-11-23 DIAGNOSIS — Z8249 Family history of ischemic heart disease and other diseases of the circulatory system: Secondary | ICD-10-CM | POA: Diagnosis not present

## 2019-11-23 DIAGNOSIS — R002 Palpitations: Secondary | ICD-10-CM | POA: Diagnosis not present

## 2019-11-23 DIAGNOSIS — Z7189 Other specified counseling: Secondary | ICD-10-CM

## 2019-11-23 NOTE — Patient Instructions (Signed)

## 2019-11-23 NOTE — Progress Notes (Signed)
Cardiology Office Note:    Date:  11/23/2019   ID:  Samantha Molina, DOB June 30, 1960, MRN [id number]  PCP:  Donald Prose, MD  Cardiologist:  Buford Dresser, MD  Referring MD: Donald Prose, MD   CC: follow up  History of Present Illness:    Samantha Molina is a 59 y.o. female with a hx of hypertension, depression/anxiety who is seen for follow up. I initially met her 01/09/19 as a new consult at the request of Donald Prose, MD for the evaluation and management of chest pain.  Chest pain history: had full workup and was told everything was fine. Happened when she was living in [place], was 300 lbs at the time. Had lap-band done in 2007, lost a huge amount weight. Has been vigilant about diet and exercise. Had been fine with no further chest pain until most recent episode and then recurrent episode. Occurred 10/28/18, sudden onset around 74 PM. Aching, substernal, associated with shortness of breath and right jaw pain. Seen in ER 10/29/18 when pain had been constant for several hours. D dimer elevated but CTPE negative. hsTn 3 -> 3. ECG nonischemic. CT cardiac with mild nonobstructive disease 02/2019.  Today: Works on the [employer], now with Covid spike has gotten very busy again.  Husband is doing better, now has gotten 2 shots for Covid, will need booster due to kidney transplant.  Reviewed echo and monitor. Echo normal, monitor shows occasional PVCs and largely linked to her symptoms. Discussed treatment for symptoms, but does not want any medication that would make her fatigued.   Chest pain unchanged. Intermittent palpitations continue.  Denies shortness of breath at rest or with normal exertion. No PND, orthopnea, LE edema or unexpected weight gain. No syncope.  Past Medical History:  Diagnosis Date  . Arthritis   . Arthritis    both knees  . Chronic leg pain   . Depression   . Diabetes mellitus without complication ([place])   . Hypertension   . Migraine without  aura   . Morbid obesity ([place])   . Urinary incontinence   . Vitamin D deficiency disease 2013    Past Surgical History:  Procedure Laterality Date  . KNEE ARTHROSCOPY W/ ACL RECONSTRUCTION Left 2004  . LAPAROSCOPIC CHOLECYSTECTOMY  2005  . LAPAROSCOPIC GASTRIC BANDING  2007  . TONSILLECTOMY  1984   Age 76  . TOTAL VAGINAL HYSTERECTOMY  age 89   Ovaries remain, for menorrhagia  . TUBAL LIGATION      Current Medications: Current Outpatient Medications on File Prior to Visit  Medication Sig  . ALPRAZolam (XANAX) 0.25 MG tablet Take 0.25 mg by mouth at bedtime as needed for anxiety.  Marland Kitchen amLODipine (NORVASC) 10 MG tablet Take 10 mg by mouth daily.  . cephALEXin (KEFLEX) 250 MG capsule Take 250 mg by mouth at bedtime.  . Cholecalciferol (VITAMIN D) 2000 UNITS tablet Take 2,000 Units by mouth daily.    Marland Kitchen desvenlafaxine (PRISTIQ) 50 MG 24 hr tablet Take 50 mg by mouth daily.    Marland Kitchen lisinopril-hydrochlorothiazide (PRINZIDE,ZESTORETIC) 20-25 MG per tablet Take 1 tablet by mouth daily.    . Multiple Vitamins-Minerals (MULTIVITAMIN WITH MINERALS) tablet Take 1 tablet by mouth daily.    Marland Kitchen nystatin cream (MYCOSTATIN) Apply 1 application topically 2 (two) times daily. Apply to affected area BID for up to 7 days.  Marland Kitchen oxybutynin (DITROPAN-XL) 10 MG 24 hr tablet TAKE 1 TABLET (10 MG TOTAL) BY MOUTH AT BEDTIME.  Marland Kitchen oxyCODONE-acetaminophen (  PERCOCET) 10-325 MG tablet Take 1 tablet by mouth every 8 (eight) hours as needed.  . topiramate (TOPAMAX) 100 MG tablet Take 100 mg by mouth 2 (two) times daily.  . traZODone (DESYREL) 50 MG tablet Take 50 mg by mouth at bedtime as needed for sleep.   No current facility-administered medications on file prior to visit.     Allergies:   Patient has no known allergies.   Social History   Tobacco Use  . Smoking status: Never Smoker  . Smokeless tobacco: Never Used  Vaping Use  . Vaping Use: Never used  Substance Use Topics  . Alcohol use: Yes    Alcohol/week:  1.0 standard drink    Types: 1 Standard drinks or equivalent per week    Comment: rarely  . Drug use: No    Family History: family history includes Breast cancer (age of onset: 26) in her paternal aunt; Breast cancer (age of onset: 4) in her cousin; Breast cancer (age of onset: 59) in her maternal aunt; Diabetes in her brother and father; Heart disease in her father and mother; Hyperlipidemia in her brother and father; Hypertension in her father and mother; Lupus in her brother, maternal aunt, and mother; Lymphoma in her son; Rheum arthritis in her mother; Stroke in her father.  Family history: father passed away from MI at age 44. Brother died age 54 from lupus/kidney disease, was on dialysis. Mother: alive age 11, has heart disease, hypertension, arthritis Father: deceased age 75, heart disease, diabetes, hypertension Mat Gma: heart disease, hypertension Mat Gpa: heart disease, hypertension Pat Gma: cancer Pat Gpa: diabetes, heart disease, hypertension Brother: alive age 80, obese, in SNF  ROS:   Please see the history of present illness.  Additional pertinent ROS otherwise unremarkable.  EKGs/Labs/Other Studies Reviewed:    The following studies were reviewed today: Monitor 2021: ~7 days of data recorded on Zio monitor. Patient had a min HR of 47 bpm, max HR of 187 bpm, and avg HR of 77 bpm. Predominant underlying rhythm was Sinus Rhythm. No atrial fibrillation, high degree block, or pauses noted. One brief episode of NSVT, 4 beats. 3 SVT events noted, no longer than 4 beats. Isolated atrial ectopy was rare (<1%). Isolated ventricular ectopy was occasional (4.3%). There were 32 triggered events. These were sinus with frequent association to PVCs.  Echo 10/24/19 1. Normal LV function; there appears to be a small aneurysmal segment in  the membranous septum but no obvious flow suggestive of residual VSD.  2. Left ventricular ejection fraction, by estimation, is 60 to 65%. The  left  ventricle has normal function. The left ventricle has no regional  wall motion abnormalities. Left ventricular diastolic parameters were  normal.  3. Right ventricular systolic function is normal. The right ventricular  size is normal. There is normal pulmonary artery systolic pressure.  4. The mitral valve is normal in structure. Mild mitral valve  regurgitation. No evidence of mitral stenosis.  5. The aortic valve is tricuspid. Aortic valve regurgitation is trivial.  No aortic stenosis is present.  6. The inferior vena cava is normal in size with greater than 50%  respiratory variability, suggesting right atrial pressure of 3 mmHg.   CT cardiac 03/01/19 Coronary calcium score: The patient's coronary artery calcium score is 114, which places the patient in the 94th percentile.  Coronary arteries: Normal coronary origins.  Right dominance.  Right Coronary Artery: Normal caliber vessel, gives rise to PDA. Mild ostial noncalcified plaque with 1-24% stenosis.  Left Main Coronary Artery: Normal caliber vessel. No significant plaque or stenosis.  Left Anterior Descending Coronary Artery: Normal caliber vessel. Proximal LAD has focal calcified plaque with 25-49% luminal stenosis. Gives rise to 3 diagonal branches. Distal LAD wraps apex  Left Circumflex Artery: Normal caliber vessel. No significant plaque or stenosis. Gives rise to 1 OM branch.  Aorta: Normal size, 31 mm at the mid ascending aorta (level of the PA bifurcation) measured double oblique. Trivial calcifications. No dissection.  Aortic Valve: No calcifications. Trileaflet.  Other findings: Normal pulmonary vein drainage into the left atrium. Normal left atrial appendage without a thrombus. Normal size of the pulmonary artery. Appears to have a small VSD, likely perimembranous.  IMPRESSION: 1. Mild nonobstructive CAD, CADRADS = 2. 2. Coronary calcium score of 114. This was 94th percentile for age and sex  matched control. 3. Normal coronary origin with right dominance. 4. Likely perimembranous VSD.  EKG:  EKG is personally reviewed.  The ekg ordered 10/06/19 demonstrates NSR at 68 bpm  Recent Labs: 01/09/2019: BUN 15; Potassium 3.9; Sodium 144 03/01/2019: Creatinine, Ser 0.70  Recent Lipid Panel No results found for: CHOL, TRIG, HDL, CHOLHDL, VLDL, LDLCALC, LDLDIRECT  Physical Exam:    VS:  BP 110/72   Pulse 67   Ht $R'5\' 6"'vo$  (1.676 m)   Wt 185 lb (83.9 kg)   SpO2 98%   BMI 29.86 kg/m     Wt Readings from Last 3 Encounters:  11/23/19 185 lb (83.9 kg)  10/04/19 188 lb 6.4 oz (85.5 kg)  01/09/19 179 lb 3.2 oz (81.3 kg)    GEN: Well nourished, well developed in no acute distress HEENT: Normal, moist mucous membranes NECK: No JVD CARDIAC: regular rhythm, normal S1 and S2, no rubs or gallops. No murmur. VASCULAR: Radial and DP pulses 2+ bilaterally. No carotid bruits RESPIRATORY:  Clear to auscultation without rales, wheezing or rhonchi  ABDOMEN: Soft, non-tender, non-distended MUSCULOSKELETAL:  Ambulates independently SKIN: Warm and dry, no edema NEUROLOGIC:  Alert and oriented x 3. No focal neuro deficits noted. PSYCHIATRIC:  Normal affect   ASSESSMENT:    1. Other chest pain   2. Nonocclusive coronary atherosclerosis of native coronary artery   3. Family history of heart disease   4. Heart palpitations   5. Essential hypertension   6. Cardiac risk counseling   7. Counseling on health promotion and disease prevention    PLAN:    Chest pain: -reviewed CT cardiac results again, mild nonobstructive CAD, should not be the source of chest pain -high suspicion for noncardiac cause  Nonobstructive CAD Family history of heart disease: -we discussed recommendations for statin and aspirin given nonobstructive CAD -she will think about this and let me know if she is amenable  Palpitations, possible VSD -monitor with 4% PVC burden, symptoms associate with PVCs -discussed  either beta blocker or calcium channel blocker. She would like to avoid medications unless it becomes more bothersome -echo largely normal, no VSD flow seen  Hypertension: Goal <130/80, at goal today Continue amlodipine, lisinopril-HCTZ  Prior obesity, now s/p lap band and BMI 29 Very motivated for excellent lifestyle, very active  Cardiac risk counseling and prevention recommendations: -recommend heart healthy/Mediterranean diet, with whole grains, fruits, vegetable, fish, lean meats, nuts, and olive oil. Limit salt. -recommend moderate walking, 3-5 times/week for 30-50 minutes each session. Aim for at least 150 minutes.week. Goal should be pace of 3 miles/hours, or walking 1.5 miles in 30 minutes -recommend avoidance of tobacco products. Avoid excess  alcohol.  Plan for follow up: 1 year or sooner PRN  Medication Adjustments/Labs and Tests Ordered: Current medicines are reviewed at length with the patient today.  Concerns regarding medicines are outlined above.  No orders of the defined types were placed in this encounter.  No orders of the defined types were placed in this encounter.   Patient Instructions  Medication Instructions:  Your Physician recommend you continue on your current medication as directed.    *If you need a refill on your cardiac medications before your next appointment, please call your pharmacy*   Lab Work: None ordered  Testing/Procedures: None ordered   Follow-Up: At [facility], you and your health needs are our priority.  As part of our continuing mission to provide you with exceptional heart care, we have created designated Provider Care Teams.  These Care Teams include your primary Cardiologist (physician) and Advanced Practice Providers (APPs -  Physician Assistants and Nurse Practitioners) who all work together to provide you with the care you need, when you need it.  We recommend signing up for the patient portal called "MyChart".  Sign up  information is provided on this After Visit Summary.  MyChart is used to connect with patients for Virtual Visits (Telemedicine).  Patients are able to view lab/test results, encounter notes, upcoming appointments, etc.  Non-urgent messages can be sent to your provider as well.   To learn more about what you can do with MyChart, go to [email or web address].    Your next appointment:   1 year(s)  The format for your next appointment:   In Person  Provider:   Buford Dresser, MD     Signed, Buford Dresser, MD PhD 11/23/2019     [place]

## 2019-11-29 ENCOUNTER — Ambulatory Visit: Payer: 59 | Admitting: Obstetrics and Gynecology

## 2019-12-04 MED FILL — LISINOPRIL-HCTZ 20-25 MG TA: 20-25 | 90 days supply | Qty: 90 | Fill #0

## 2019-12-08 ENCOUNTER — Encounter: Payer: Self-pay | Admitting: Cardiology

## 2019-12-12 ENCOUNTER — Ambulatory Visit: Admitting: Psychology

## 2019-12-18 ENCOUNTER — Other Ambulatory Visit (HOSPITAL_COMMUNITY): Payer: Self-pay | Admitting: Family Medicine

## 2019-12-18 MED FILL — ALPRAZolam 0.25 MG TABS: 0.25 | 30 days supply | Qty: 30 | Fill #1

## 2019-12-18 MED FILL — AMLODIPINE BESYLATE 10 MG T: 10 | 90 days supply | Qty: 90 | Fill #0

## 2019-12-18 MED FILL — TOPIRAMATE 100 MG TABLET: 100 | 30 days supply | Qty: 60 | Fill #2

## 2019-12-20 MED FILL — OXYCODONE-APAP 10-325: 10-325 | 30 days supply | Qty: 90 | Fill #0

## 2019-12-29 ENCOUNTER — Other Ambulatory Visit: Payer: Self-pay | Admitting: Obstetrics and Gynecology

## 2019-12-29 MED FILL — OXYBUTYNIN CL ER 10 MG TAB: 10 | 90 days supply | Qty: 90 | Fill #0

## 2019-12-29 NOTE — Telephone Encounter (Signed)
She is already overdue for her annual exam. Can you please try and get her in sooner. 3 month script sent.

## 2019-12-29 NOTE — Telephone Encounter (Signed)
Medication refill request: Oxybutynin Last AEX:  08/10/18 JJ Next AEX: 05/29/20 Last MMG (if hormonal medication request): 04/12/19 BIRADS 1 negative/density b Refill authorized: Today, please advise

## 2020-01-07 ENCOUNTER — Encounter: Payer: Self-pay | Admitting: Cardiology

## 2020-01-08 MED FILL — CEPHALEXIN 250 MG CAPSULE: 250 | 90 days supply | Qty: 90 | Fill #1

## 2020-01-08 MED FILL — OXYBUTYNIN CL ER 10 MG TAB: 10 | 90 days supply | Qty: 90 | Fill #0

## 2020-01-18 ENCOUNTER — Other Ambulatory Visit (HOSPITAL_COMMUNITY): Payer: Self-pay | Admitting: Specialist

## 2020-01-18 MED FILL — OXYCODONE-APAP 10-325: 10-325 | 30 days supply | Qty: 90 | Fill #0

## 2020-01-24 MED FILL — TOPIRAMATE 100 MG TABLET: 100 | 30 days supply | Qty: 60 | Fill #3

## 2020-01-31 MED FILL — ALPRAZolam 0.25 MG TABS: 0.25 | 30 days supply | Qty: 30 | Fill #2

## 2020-02-13 MED FILL — DESVENLAFAXINE SUC ER 50 MG: 50 | 90 days supply | Qty: 90 | Fill #1

## 2020-02-14 ENCOUNTER — Other Ambulatory Visit (HOSPITAL_COMMUNITY): Payer: Self-pay | Admitting: Specialist

## 2020-02-15 MED FILL — OXYCODONE-APAP 10-325: 10-325 | 30 days supply | Qty: 90 | Fill #0

## 2020-02-21 ENCOUNTER — Ambulatory Visit: Payer: 59 | Admitting: Psychology

## 2020-02-28 ENCOUNTER — Other Ambulatory Visit (HOSPITAL_COMMUNITY): Payer: Self-pay | Admitting: Specialist

## 2020-02-28 MED FILL — TOPIRAMATE 100 MG TABLET: 100 | 30 days supply | Qty: 60 | Fill #0

## 2020-02-29 ENCOUNTER — Other Ambulatory Visit: Payer: Self-pay | Admitting: Family Medicine

## 2020-02-29 DIAGNOSIS — Z1231 Encounter for screening mammogram for malignant neoplasm of breast: Secondary | ICD-10-CM

## 2020-03-08 ENCOUNTER — Other Ambulatory Visit (HOSPITAL_COMMUNITY): Payer: Self-pay | Admitting: Specialist

## 2020-03-08 MED FILL — LISINOPRIL-HCTZ 20-25 MG TA: 20-25 | 90 days supply | Qty: 90 | Fill #1

## 2020-03-14 MED FILL — OXYCODONE-APAP 10-325: 10-325 | 30 days supply | Qty: 90 | Fill #0

## 2020-03-20 DIAGNOSIS — R202 Paresthesia of skin: Secondary | ICD-10-CM | POA: Diagnosis not present

## 2020-03-20 DIAGNOSIS — G43009 Migraine without aura, not intractable, without status migrainosus: Secondary | ICD-10-CM | POA: Diagnosis not present

## 2020-03-20 DIAGNOSIS — M5417 Radiculopathy, lumbosacral region: Secondary | ICD-10-CM | POA: Diagnosis not present

## 2020-03-20 DIAGNOSIS — M542 Cervicalgia: Secondary | ICD-10-CM | POA: Diagnosis not present

## 2020-03-20 DIAGNOSIS — M25562 Pain in left knee: Secondary | ICD-10-CM | POA: Diagnosis not present

## 2020-03-20 DIAGNOSIS — F419 Anxiety disorder, unspecified: Secondary | ICD-10-CM | POA: Diagnosis not present

## 2020-03-20 MED FILL — AMLODIPINE BESYLATE 10 MG T: 10 | 90 days supply | Qty: 90 | Fill #1

## 2020-03-23 HISTORY — PX: JOINT REPLACEMENT: SHX530

## 2020-03-28 MED FILL — ALPRAZolam 0.25 MG TABS: 0.25 | 30 days supply | Qty: 30 | Fill #3

## 2020-04-05 MED FILL — TOPIRAMATE 100 MG TABLET: 100 | 30 days supply | Qty: 60 | Fill #1

## 2020-04-11 ENCOUNTER — Other Ambulatory Visit (HOSPITAL_COMMUNITY): Payer: Self-pay | Admitting: Specialist

## 2020-04-11 MED FILL — OXYCODONE-APAP 10-325: 10-325 | 30 days supply | Qty: 90 | Fill #0

## 2020-04-17 ENCOUNTER — Other Ambulatory Visit (HOSPITAL_COMMUNITY): Payer: Self-pay | Admitting: Family Medicine

## 2020-04-17 DIAGNOSIS — F411 Generalized anxiety disorder: Secondary | ICD-10-CM | POA: Diagnosis not present

## 2020-04-17 DIAGNOSIS — I700 Atherosclerosis of aorta: Secondary | ICD-10-CM | POA: Diagnosis not present

## 2020-04-17 DIAGNOSIS — F320 Major depressive disorder, single episode, mild: Secondary | ICD-10-CM | POA: Diagnosis not present

## 2020-04-17 DIAGNOSIS — I10 Essential (primary) hypertension: Secondary | ICD-10-CM | POA: Diagnosis not present

## 2020-04-17 DIAGNOSIS — G4700 Insomnia, unspecified: Secondary | ICD-10-CM | POA: Diagnosis not present

## 2020-04-17 DIAGNOSIS — E1169 Type 2 diabetes mellitus with other specified complication: Secondary | ICD-10-CM | POA: Diagnosis not present

## 2020-04-17 MED FILL — ATORVASTATIN CALCIUM 10 MG: 10 | 90 days supply | Qty: 90 | Fill #0

## 2020-04-24 DIAGNOSIS — R200 Anesthesia of skin: Secondary | ICD-10-CM | POA: Diagnosis not present

## 2020-04-24 DIAGNOSIS — G5603 Carpal tunnel syndrome, bilateral upper limbs: Secondary | ICD-10-CM | POA: Diagnosis not present

## 2020-04-24 DIAGNOSIS — R252 Cramp and spasm: Secondary | ICD-10-CM | POA: Diagnosis not present

## 2020-04-24 DIAGNOSIS — M5412 Radiculopathy, cervical region: Secondary | ICD-10-CM | POA: Diagnosis not present

## 2020-04-24 DIAGNOSIS — Z79899 Other long term (current) drug therapy: Secondary | ICD-10-CM | POA: Diagnosis not present

## 2020-04-24 DIAGNOSIS — M5417 Radiculopathy, lumbosacral region: Secondary | ICD-10-CM | POA: Diagnosis not present

## 2020-04-24 DIAGNOSIS — G603 Idiopathic progressive neuropathy: Secondary | ICD-10-CM | POA: Diagnosis not present

## 2020-04-24 DIAGNOSIS — G43009 Migraine without aura, not intractable, without status migrainosus: Secondary | ICD-10-CM | POA: Diagnosis not present

## 2020-04-25 ENCOUNTER — Ambulatory Visit: Payer: 59 | Admitting: Psychology

## 2020-05-02 ENCOUNTER — Other Ambulatory Visit: Payer: Self-pay

## 2020-05-02 ENCOUNTER — Ambulatory Visit
Admission: RE | Admit: 2020-05-02 | Discharge: 2020-05-02 | Disposition: A | Discharge status: Home / Self Care | Payer: 59 | Source: Ambulatory Visit | Attending: Family Medicine | Admitting: Family Medicine

## 2020-05-02 DIAGNOSIS — Z1231 Encounter for screening mammogram for malignant neoplasm of breast: Secondary | ICD-10-CM | POA: Diagnosis not present

## 2020-05-09 ENCOUNTER — Other Ambulatory Visit (HOSPITAL_COMMUNITY): Payer: Self-pay | Admitting: Specialist

## 2020-05-10 ENCOUNTER — Other Ambulatory Visit (HOSPITAL_COMMUNITY): Payer: Self-pay | Admitting: Family Medicine

## 2020-05-10 ENCOUNTER — Other Ambulatory Visit: Payer: Self-pay | Admitting: Obstetrics and Gynecology

## 2020-05-10 MED FILL — CEPHALEXIN 250 MG CAPSULE: 250 | 90 days supply | Qty: 90 | Fill #2

## 2020-05-10 MED FILL — OXYCODONE-APAP 10-325: 10-325 | 30 days supply | Qty: 90 | Fill #0

## 2020-05-10 MED FILL — TOPIRAMATE 100 MG TABLET: 100 | 30 days supply | Qty: 60 | Fill #2

## 2020-05-10 MED FILL — ALPRAZolam 0.25 MG TABS: 0.25 | 30 days supply | Qty: 30 | Fill #0

## 2020-05-10 MED FILL — DESVENLAFAXINE SUC ER 50 MG: 50 | 90 days supply | Qty: 90 | Fill #0

## 2020-05-10 NOTE — Telephone Encounter (Signed)
Medication refill request: Oxybutynin Last AEX:  08/10/18 JJ Next AEX: 05/29/20 Last MMG (if hormonal medication request): n/a Refill authorized: today, please advise

## 2020-05-13 ENCOUNTER — Other Ambulatory Visit: Payer: Self-pay | Admitting: Obstetrics and Gynecology

## 2020-05-13 MED FILL — OXYBUTYNIN CL ER 10 MG TAB: 10 | 90 days supply | Qty: 90 | Fill #0

## 2020-05-29 ENCOUNTER — Other Ambulatory Visit: Payer: Self-pay | Admitting: Obstetrics and Gynecology

## 2020-05-29 ENCOUNTER — Ambulatory Visit (INDEPENDENT_AMBULATORY_CARE_PROVIDER_SITE_OTHER): Payer: 59 | Admitting: Obstetrics and Gynecology

## 2020-05-29 ENCOUNTER — Other Ambulatory Visit: Payer: Self-pay

## 2020-05-29 ENCOUNTER — Encounter: Payer: Self-pay | Admitting: Obstetrics and Gynecology

## 2020-05-29 VITALS — BP 110/64 | HR 105 | Ht 66.0 in | Wt 189.0 lb

## 2020-05-29 DIAGNOSIS — B372 Candidiasis of skin and nail: Secondary | ICD-10-CM

## 2020-05-29 DIAGNOSIS — N3281 Overactive bladder: Secondary | ICD-10-CM | POA: Diagnosis not present

## 2020-05-29 DIAGNOSIS — Z01419 Encounter for gynecological examination (general) (routine) without abnormal findings: Secondary | ICD-10-CM | POA: Diagnosis not present

## 2020-05-29 DIAGNOSIS — Z1211 Encounter for screening for malignant neoplasm of colon: Secondary | ICD-10-CM | POA: Diagnosis not present

## 2020-05-29 MED ORDER — NYSTATIN 100000 UNIT/GM EX CREA: 1.0000 "application " | TOPICAL_CREAM | Freq: Two times a day (BID) | CUTANEOUS | 0 refills | Status: DC

## 2020-05-29 MED ORDER — OXYBUTYNIN CHLORIDE ER 10 MG PO TB24: 10.0000 mg | ORAL_TABLET | Freq: Every day | ORAL | 3 refills | Status: DC

## 2020-05-29 MED FILL — NYSTATIN 100,000 UNIT/GM CR: 100000 | 7 days supply | Qty: 30 | Fill #0

## 2020-05-29 NOTE — Progress Notes (Signed)
60 y.o. [id number] Married Black or [place] American Not Hispanic or Latino female here for annual exam.  H/O TVH.  She is c/o skin irritation in bilateral groin and under her panus. Started a couple of weeks ago.    H/O OAB, on ditropan and doing well.  2 of her grandchildren are currently in the hospital, one with a sickle cell crisis, the other is getting his monthly transfusion for sickle cell.   Last year her husband had a kidney transplant, doing well. He was so sick last year, she thought she was going to loose him.   Mom was very sick with covid last year.   H/O DM, last hgbA1c was normal. Managed with diet and exercise.  Last year she hit her goal weight at 173. With the stress of her husbands kidney transplant she gained weight.     No LMP recorded. Patient has had a hysterectomy.          Sexually active: No. H/O ED.  The current method of family planning is post menopausal status.    Exercising: Yes.    zumba, spin Smoker:  no  Health Maintenance: Pap:  Unsure, h/o TVH History of abnormal Pap:  no MMG:  05/06/20 density B Bi-rads 1 neg  BMD:   None  Colonoscopy: 08/27/10, f/u due in 22 TDaP:  UTD Gardasil: NA   reports that she has never smoked. She has never used smokeless tobacco. She reports current alcohol use of about 1.0 standard drink of alcohol per week. She reports that she does not use drugs. 2 sons, 12 grandchildren, one great grandchild. Works as a Diplomatic Services operational officer at [employer].   Past Medical History:  Diagnosis Date  . Arthritis   . Arthritis    both knees  . Chronic leg pain   . Depression   . Diabetes mellitus without complication (HCC)   . Hypertension   . Migraine without aura   . Morbid obesity (HCC)   . Urinary incontinence   . Vitamin D deficiency disease 2013    Past Surgical History:  Procedure Laterality Date  . KNEE ARTHROSCOPY W/ ACL RECONSTRUCTION Left 2004  . LAPAROSCOPIC CHOLECYSTECTOMY  2005  . LAPAROSCOPIC GASTRIC BANDING  2007  .  TONSILLECTOMY  1984   Age 60  . TOTAL VAGINAL HYSTERECTOMY  age 60   Ovaries remain, for menorrhagia  . TUBAL LIGATION      Current Outpatient Medications  Medication Sig Dispense Refill  . ALPRAZolam (XANAX) 0.25 MG tablet Take 0.25 mg by mouth at bedtime as needed for anxiety.    Marland Kitchen amLODipine (NORVASC) 10 MG tablet Take 10 mg by mouth daily.  1  . cephALEXin (KEFLEX) 250 MG capsule Take 250 mg by mouth at bedtime.  5  . Cholecalciferol (VITAMIN D) 2000 UNITS tablet Take 2,000 Units by mouth daily.    Marland Kitchen desvenlafaxine (PRISTIQ) 50 MG 24 hr tablet Take 50 mg by mouth daily.    Marland Kitchen lisinopril-hydrochlorothiazide (PRINZIDE,ZESTORETIC) 20-25 MG per tablet Take 1 tablet by mouth daily.    . Multiple Vitamins-Minerals (MULTIVITAMIN WITH MINERALS) tablet Take 1 tablet by mouth daily.    Marland Kitchen oxybutynin (DITROPAN-XL) 10 MG 24 hr tablet TAKE 1 TABLET BY MOUTH AT BEDTIME 90 tablet 0  . oxyCODONE-acetaminophen (PERCOCET) 10-325 MG tablet Take 1 tablet by mouth every 8 (eight) hours as needed.  0  . topiramate (TOPAMAX) 100 MG tablet Take 100 mg by mouth 2 (two) times daily.  4  . traZODone (DESYREL)  50 MG tablet Take 50 mg by mouth at bedtime as needed for sleep.     No current facility-administered medications for this visit.    Family History  Problem Relation Age of Onset  . Hypertension Mother   . Rheum arthritis Mother        Lupus  . Heart disease Mother   . Lupus Mother   . Diabetes Father   . Hypertension Father   . Stroke Father   . Hyperlipidemia Father   . Heart disease Father   . Lupus Brother   . Diabetes Brother   . Hyperlipidemia Brother   . Breast cancer Maternal Aunt 64       X 2, one in her 62's  . Lupus Maternal Aunt   . Breast cancer Cousin 41       maternal & paternal cousin  . Breast cancer Paternal Aunt 30       died in her 38's  . Lymphoma Son     Review of Systems  All other systems reviewed and are negative.   Exam:   BP 110/64   Pulse (!) 105   Ht  5\' 6"  (1.676 m)   Wt 189 lb (85.7 kg)   SpO2 100%   BMI 30.51 kg/m   Weight change: @WEIGHTCHANGE @ Height:   Height: 5\' 6"  (167.6 cm)  Ht Readings from Last 3 Encounters:  05/29/20 5\' 6"  (1.676 m)  11/23/19 5\' 6"  (1.676 m)  10/04/19 5\' 6"  (1.676 m)    General appearance: alert, cooperative and appears stated age Head: Normocephalic, without obvious abnormality, atraumatic Neck: no adenopathy, supple, symmetrical, trachea midline and thyroid normal to inspection and palpation Lungs: clear to auscultation bilaterally Cardiovascular: regular rate and rhythm Breasts: normal appearance, no masses or tenderness Abdomen: soft, non-tender; non distended,  no masses,  no organomegaly Extremities: extremities normal, atraumatic, no cyanosis or edema Skin: Skin color, texture, turgor normal. Rash under the panus and in bilateral groin (mild), suspect candida intertrigo Lymph nodes: Cervical, supraclavicular, and axillary nodes normal. No abnormal inguinal nodes palpated Neurologic: Grossly normal   Pelvic: External genitalia:  no lesions              Urethra:  normal appearing urethra with no masses, tenderness or lesions              Bartholins and Skenes: normal                 Vagina: atrophic appearing vagina with normal color and discharge, no lesions              Cervix: absent               Bimanual Exam:  Uterus:  uterus absent              Adnexa: no mass, fullness, tenderness               Rectovaginal: Confirms               Anus:  normal sphincter tone, no lesions  07/29/20 chaperoned for the exam.  1. Well woman exam Discussed breast self exam Discussed calcium and vit D intake Mammogram UTD  2. Colon cancer screening Due in 6/22  3. OAB (overactive bladder) Doing well on the ditropan - oxybutynin (DITROPAN-XL) 10 MG 24 hr tablet; Take 1 tablet (10 mg total) by mouth at bedtime.  Dispense: 90 tablet; Refill: 3  4. Candidal intertrigo - nystatin cream  (MYCOSTATIN);  Apply 1 application topically 2 (two) times daily. Apply to affected area BID for up to 7 days.  Dispense: 30 g; Refill: 0

## 2020-05-29 NOTE — Patient Instructions (Signed)

## 2020-05-30 IMAGING — CT CT HEART MORP W/ CTA COR W/ SCORE W/ CA W/CM &/OR W/O CM
4 of 7 series · 8 of 20 positions shown, 9 images · IV contrast (omnipaque)
Comparison: Chest CTA 10/29/2018
COMPARISON: Chest CTA 10/29/2018

Addendum:
EXAM:
OVER-READ INTERPRETATION  CT CHEST

The following report is an over-read performed by radiologist Dr.
Belen Ignacia Chandia [[email or web address]] on 03/01/2019. This over-read
does not include interpretation of cardiac or coronary anatomy or
pathology. The coronary calcium score/coronary CTA interpretation by
the cardiologist is attached.
HISTORY: Chest pain, normal ekg chest pain
Cardiac/Coronary CT
TECHNIQUE: The patient was scanned on a Siemens Force scanner.
PROTOCOL: A 120 kV prospective scan was triggered in the descending thoracic
aorta at 111 HU's. Axial non-contrast 3 mm slices were carried out
through the heart. The data set was analyzed on a dedicated work
station and scored using the Agatson method. Gantry rotation speed
was 250 msecs and collimation was 0.6 mm. Beta blockade and 0.8 mg
of sl NTG was given. The 3D data set was reconstructed in 5%
intervals of 35-75% of the R-R cycle. Diastolic phases were analyzed
on a dedicated work station using MPR, MIP and VRT modes. The
patient received 80mL OMNIPAQUE IOHEXOL 350 MG/ML SOLN of contrast.

[Series 6: best diast 73 % · axial · 0.37mm/px · z∈[+1155,+1194]mm · 2 of 296 slices shown, 3 images]
[im 99/296  vessel]
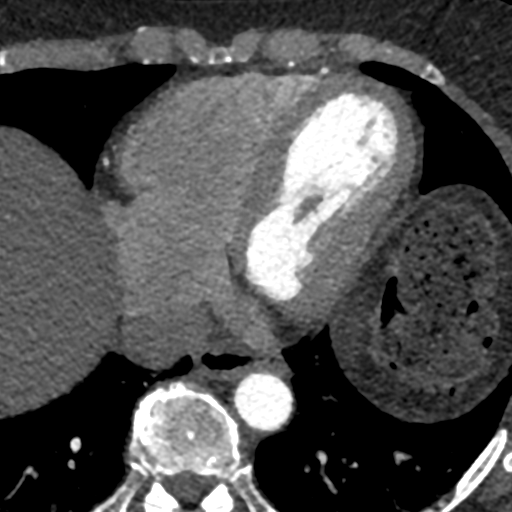
[im 99/296  lung]
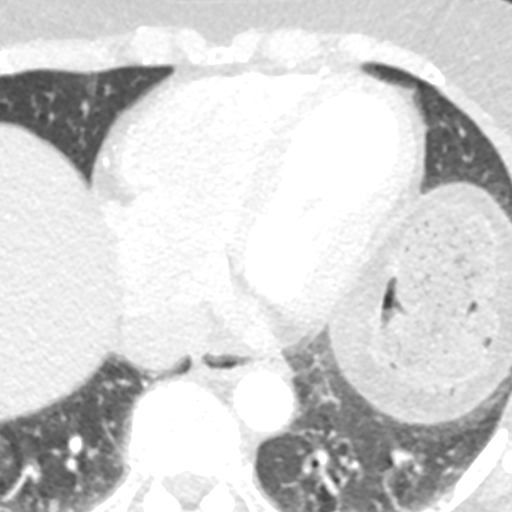
[im 197/296  vessel]
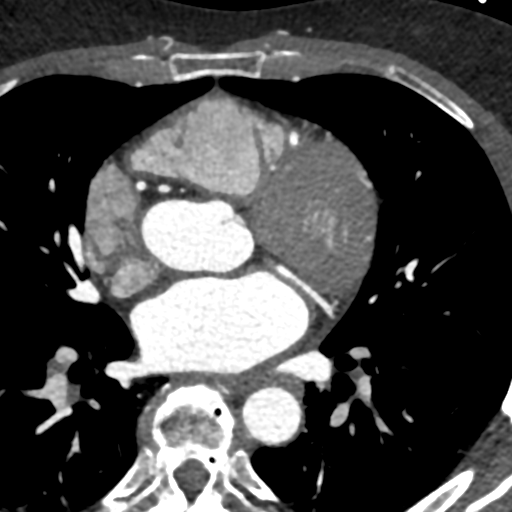

[Series 7: best syst 73 % · axial · 0.37mm/px · z∈[+1155,+1194]mm · 2 of 296 slices shown]
[im 99/296  vessel]
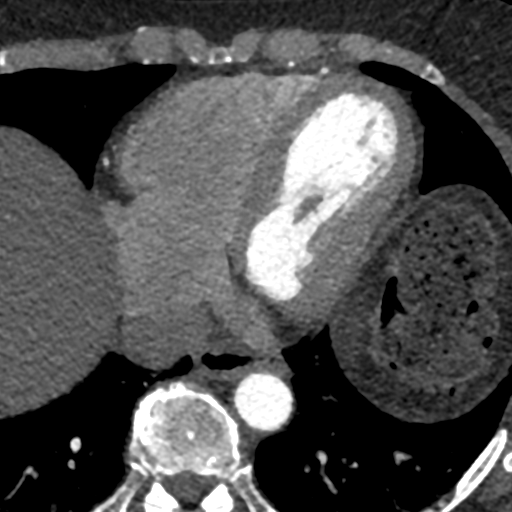
[im 197/296  vessel]
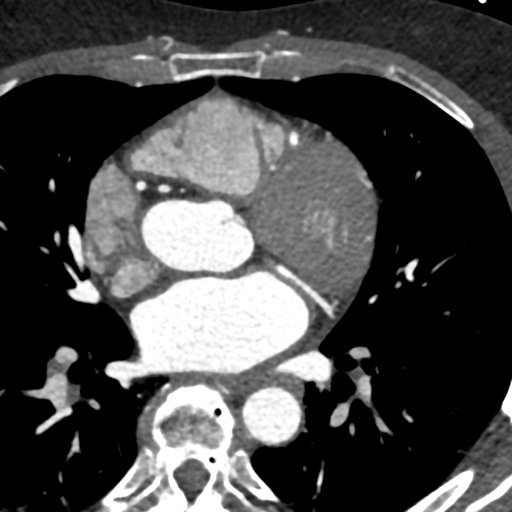

[Series 8: ts diast sharp 73 % · axial · 0.37mm/px · z∈[+1155,+1194]mm · 2 of 296 slices shown]
[im 99/296  lung]
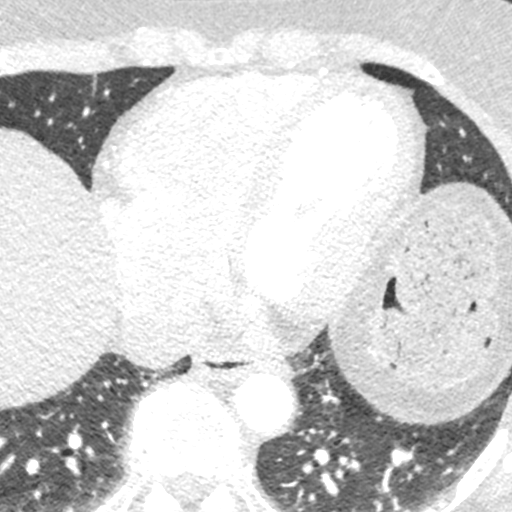
[im 197/296  lung]
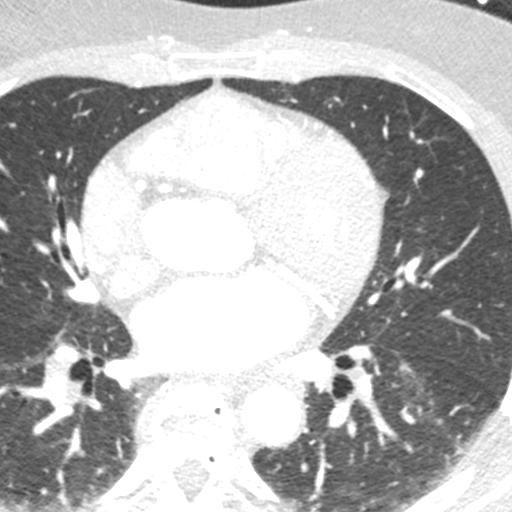

[Series 9: ts syst sharp 73 % · axial · 0.37mm/px · z∈[+1155,+1194]mm · 2 of 296 slices shown]
[im 99/296  lung]
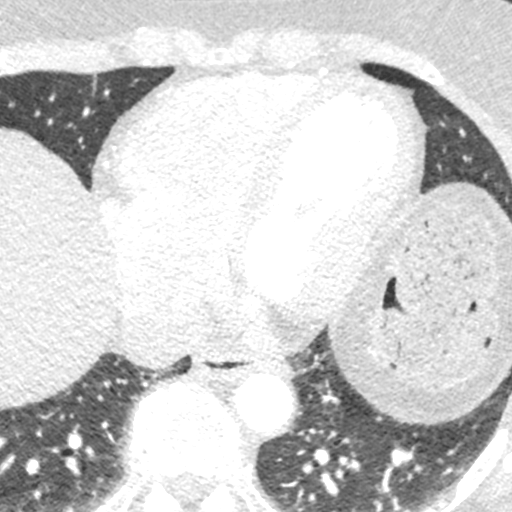
[im 197/296  lung]
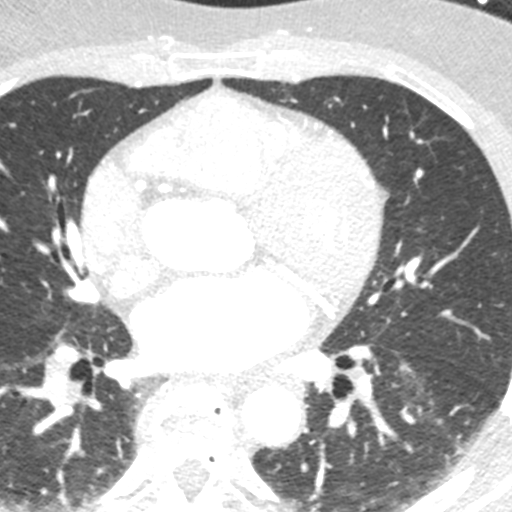

[8 of 20 positions shown; findings below may reference images not displayed]

FINDINGS: Vascular: Normal caliber of the ascending thoracic aorta measuring
up to 3.1 cm. No evidence for dissection in the visualized thoracic
aorta. Main pulmonary artery is patent.

Mediastinum/Nodes: Normal appearance of the visualized mediastinal
structures.

Lungs/Pleura: Visualized lungs are clear.  No pleural effusions.

Upper Abdomen: Patient has a gastric lap band that is partially
visualized.

Musculoskeletal: No suspicious bone findings.
IMPRESSION: Negative over-read examination.
FINDINGS: Coronary calcium score: The patient's coronary artery calcium score
is 114, which places the patient in the 94th percentile.

Coronary arteries: Normal coronary origins.  Right dominance.

Right Coronary Artery: Normal caliber vessel, gives rise to PDA.
Mild ostial noncalcified plaque with 1-24% stenosis.

Left Main Coronary Artery: Normal caliber vessel. No significant
plaque or stenosis.

Left Anterior Descending Coronary Artery: Normal caliber vessel.
Proximal LAD has focal calcified plaque with 25-49% luminal
stenosis. Gives rise to 3 diagonal branches. Distal LAD wraps apex

Left Circumflex Artery: Normal caliber vessel. No significant plaque
or stenosis. Gives rise to 1 OM branch.

Aorta: Normal size, 31 mm at the mid ascending aorta (level of the
PA bifurcation) measured double oblique. Trivial calcifications. No
dissection.

Aortic Valve: No calcifications. Trileaflet.

Other findings:

Normal pulmonary vein drainage into the left atrium.

Normal left atrial appendage without a thrombus.

Normal size of the pulmonary artery.

Appears to have a small VSD, likely perimembranous.
IMPRESSION: 1. Mild nonobstructive CAD, CADRADS = 2.

2. Coronary calcium score of 114. This was 94th percentile for age
and sex matched control.

3. Normal coronary origin with right dominance.

4. Likely perimembranous VSD.

*** End of Addendum ***
EXAM:
OVER-READ INTERPRETATION  CT CHEST

The following report is an over-read performed by radiologist Dr.
Belen Ignacia Chandia [[email or web address]] on 03/01/2019. This over-read
does not include interpretation of cardiac or coronary anatomy or
pathology. The coronary calcium score/coronary CTA interpretation by
the cardiologist is attached.
FINDINGS: Vascular: Normal caliber of the ascending thoracic aorta measuring
up to 3.1 cm. No evidence for dissection in the visualized thoracic
aorta. Main pulmonary artery is patent.

Mediastinum/Nodes: Normal appearance of the visualized mediastinal
structures.

Lungs/Pleura: Visualized lungs are clear.  No pleural effusions.

Upper Abdomen: Patient has a gastric lap band that is partially
visualized.

Musculoskeletal: No suspicious bone findings.
IMPRESSION: Negative over-read examination.

## 2020-06-05 ENCOUNTER — Other Ambulatory Visit (HOSPITAL_COMMUNITY): Payer: Self-pay | Admitting: Specialist

## 2020-06-10 ENCOUNTER — Other Ambulatory Visit (HOSPITAL_COMMUNITY): Payer: Self-pay | Admitting: Family Medicine

## 2020-06-10 MED FILL — LISINOPRIL-HCTZ 20-25 MG TA: 20-25 | 90 days supply | Qty: 90 | Fill #0

## 2020-06-17 MED FILL — TOPIRAMATE 100 MG TABLET: 100 | 30 days supply | Qty: 60 | Fill #3

## 2020-06-19 ENCOUNTER — Other Ambulatory Visit (HOSPITAL_COMMUNITY): Payer: Self-pay | Admitting: Family Medicine

## 2020-06-19 DIAGNOSIS — M5417 Radiculopathy, lumbosacral region: Secondary | ICD-10-CM | POA: Diagnosis not present

## 2020-06-19 DIAGNOSIS — M542 Cervicalgia: Secondary | ICD-10-CM | POA: Diagnosis not present

## 2020-06-19 DIAGNOSIS — R201 Hypoesthesia of skin: Secondary | ICD-10-CM | POA: Diagnosis not present

## 2020-06-19 DIAGNOSIS — G43009 Migraine without aura, not intractable, without status migrainosus: Secondary | ICD-10-CM | POA: Diagnosis not present

## 2020-06-19 DIAGNOSIS — F419 Anxiety disorder, unspecified: Secondary | ICD-10-CM | POA: Diagnosis not present

## 2020-07-01 ENCOUNTER — Other Ambulatory Visit (HOSPITAL_COMMUNITY): Payer: Self-pay

## 2020-07-01 MED FILL — Amlodipine Besylate Tab 10 MG (Base Equivalent): ORAL | 90 days supply | Qty: 90 | Fill #0 | Status: AC

## 2020-07-02 ENCOUNTER — Other Ambulatory Visit (HOSPITAL_COMMUNITY): Payer: Self-pay

## 2020-07-02 MED FILL — Alprazolam Tab 0.25 MG: ORAL | 30 days supply | Qty: 30 | Fill #0 | Status: AC

## 2020-07-05 ENCOUNTER — Other Ambulatory Visit (HOSPITAL_COMMUNITY): Payer: Self-pay

## 2020-07-08 ENCOUNTER — Other Ambulatory Visit (HOSPITAL_COMMUNITY): Payer: Self-pay

## 2020-07-08 MED ORDER — OXYCODONE-ACETAMINOPHEN 10-325 MG PO TABS
ORAL_TABLET | Freq: Three times a day (TID) | ORAL | 0 refills | Status: DC | PRN
  Filled 2020-07-08: qty 90, 30d supply, fill #0

## 2020-07-09 ENCOUNTER — Other Ambulatory Visit (HOSPITAL_COMMUNITY): Payer: Self-pay

## 2020-07-15 ENCOUNTER — Emergency Department (HOSPITAL_BASED_OUTPATIENT_CLINIC_OR_DEPARTMENT_OTHER): Payer: 59 | Admitting: Radiology

## 2020-07-15 ENCOUNTER — Other Ambulatory Visit: Payer: Self-pay

## 2020-07-15 ENCOUNTER — Encounter (HOSPITAL_BASED_OUTPATIENT_CLINIC_OR_DEPARTMENT_OTHER): Payer: Self-pay | Admitting: Emergency Medicine

## 2020-07-15 ENCOUNTER — Emergency Department (HOSPITAL_BASED_OUTPATIENT_CLINIC_OR_DEPARTMENT_OTHER)
Admission: EM | Admit: 2020-07-15 | Discharge: 2020-07-15 | Disposition: A | Discharge status: Home / Self Care | Payer: 59 | Attending: Emergency Medicine | Admitting: Emergency Medicine

## 2020-07-15 DIAGNOSIS — Z79899 Other long term (current) drug therapy: Secondary | ICD-10-CM | POA: Diagnosis not present

## 2020-07-15 DIAGNOSIS — I10 Essential (primary) hypertension: Secondary | ICD-10-CM | POA: Diagnosis not present

## 2020-07-15 DIAGNOSIS — I2510 Atherosclerotic heart disease of native coronary artery without angina pectoris: Secondary | ICD-10-CM | POA: Insufficient documentation

## 2020-07-15 DIAGNOSIS — E119 Type 2 diabetes mellitus without complications: Secondary | ICD-10-CM | POA: Insufficient documentation

## 2020-07-15 DIAGNOSIS — R079 Chest pain, unspecified: Secondary | ICD-10-CM | POA: Insufficient documentation

## 2020-07-15 DIAGNOSIS — R072 Precordial pain: Secondary | ICD-10-CM

## 2020-07-15 DIAGNOSIS — R0602 Shortness of breath: Secondary | ICD-10-CM | POA: Diagnosis not present

## 2020-07-15 HISTORY — DX: Atherosclerotic heart disease of native coronary artery without angina pectoris: I25.10

## 2020-07-15 LAB — COMPREHENSIVE METABOLIC PANEL
ALT: 21 U/L (ref 0–44)
AST: 18 U/L (ref 15–41)
Albumin: 4.1 g/dL (ref 3.5–5.0)
Alkaline Phosphatase: 41 U/L (ref 38–126)
Anion gap: 6 (ref 5–15)
BUN: 15 mg/dL (ref 6–20)
CO2: 29 mmol/L (ref 22–32)
Calcium: 9.7 mg/dL (ref 8.9–10.3)
Chloride: 105 mmol/L (ref 98–111)
Creatinine, Ser: 0.76 mg/dL (ref 0.44–1.00)
GFR, Estimated: >60 mL/min (ref >60)
Glucose, Bld: 87 mg/dL (ref 70–99)
Potassium: 3.7 mmol/L (ref 3.5–5.1)
Sodium: 140 mmol/L (ref 135–145)
Total Bilirubin: 0.5 mg/dL (ref 0.3–1.2)
Total Protein: 6.9 g/dL (ref 6.5–8.1)

## 2020-07-15 LAB — TROPONIN I (HIGH SENSITIVITY)
Troponin I (High Sensitivity): 6 ng/L (ref <18)
Troponin I (High Sensitivity): 6 ng/L (ref <18)

## 2020-07-15 LAB — CBC
HCT: 41 % (ref 36.0–46.0)
Hemoglobin: 12.7 g/dL (ref 12.0–15.0)
MCH: 24.3 pg — ABNORMAL LOW (ref 26.0–34.0)
MCHC: 31 g/dL (ref 30.0–36.0)
MCV: 78.5 fL — ABNORMAL LOW (ref 80.0–100.0)
Platelets: 237 10*3/uL (ref 150–400)
RBC: 5.22 MIL/uL — ABNORMAL HIGH (ref 3.87–5.11)
RDW: 15.7 % — ABNORMAL HIGH (ref 11.5–15.5)
WBC: 5.9 10*3/uL (ref 4.0–10.5)
nRBC: 0 % (ref 0.0–0.2)

## 2020-07-15 MED ORDER — ACETAMINOPHEN 500 MG PO TABS
1000.0000 mg | ORAL_TABLET | Freq: Once | ORAL | Status: AC
  Administered 2020-07-15: 1000 mg via ORAL
  Filled 2020-07-15: qty 2

## 2020-07-15 MED ORDER — FAMOTIDINE 20 MG PO TABS
20.0000 mg | ORAL_TABLET | Freq: Once | ORAL | Status: AC
  Administered 2020-07-15: 20 mg via ORAL
  Filled 2020-07-15: qty 1

## 2020-07-15 MED ORDER — ALUM & MAG HYDROXIDE-SIMETH 200-200-20 MG/5ML PO SUSP
30.0000 mL | Freq: Once | ORAL | Status: AC
  Administered 2020-07-15: 30 mL via ORAL
  Filled 2020-07-15: qty 30

## 2020-07-15 NOTE — ED Notes (Signed)
Patient verbalizes understanding of discharge instructions. Opportunity for questioning and answers were provided. Armband removed by staff, pt discharged from ED.  

## 2020-07-15 NOTE — ED Triage Notes (Signed)
Pt via pov from home with recurrent chest pain x 35-40 minutes. Pt has hx of cardiac issues and is under the care of a cardiologist. Pt reports pain started in her back and radiates to central chest, feels like aching and stabbing. Pt has sob when she was lying down; denies n/v/diaphoresis. Pt alert & oriented, nad noted.

## 2020-07-15 NOTE — Discharge Instructions (Addendum)
It was our pleasure to provide your ER care today - we hope that you feel better.  Take acetaminophen as need. If gi symptoms, try pepcid and/or maalox as need.   For recent chest pain, follow up with cardiologist in the next 1-2 weeks - call office to arrange appointment.   Return to ER if worse, new symptoms, fevers, trouble breathing, recurrent or persistent chest pain, or other concern.

## 2020-07-15 NOTE — ED Provider Notes (Signed)
[facility] EMERGENCY DEPT Provider Note   CSN: [id number] Arrival date & time: 07/15/20  1254     History Chief Complaint  Patient presents with  . Chest Pain    Samantha Molina is a 60 y.o. female.  Patient c/o mid chest pain since this AM. Symptoms at rest, constant, dull, mid chest, non radiating, not pleuritic. No associated sob, nv or diaphoresis. Denies hx cad/cath. +fam hx cad in older age. Denies leg pain or swelling. No pleuritic pain or sob. No recent surgery, immobility, trauma or travel. No cough or uri symptoms. No fever or chills. No chest wall injury or strain. No heartburn. States has been followed by cardiology for same/intermittent chest pain.   The history is provided by the patient.  Chest Pain Associated symptoms: no abdominal pain, no back pain, no cough, no fever, no headache, no nausea, no palpitations, no shortness of breath and no vomiting        Past Medical History:  Diagnosis Date  . Arthritis   . Arthritis    both knees  . Chronic leg pain   . Coronary artery disease   . Depression   . Diabetes mellitus without complication (HCC)   . Hypertension   . Migraine without aura   . Morbid obesity (HCC)   . Urinary incontinence   . Vitamin D deficiency disease 2013    Patient Active Problem List   Diagnosis Date Noted  . Perimembranous ventricular septal defect 10/04/2019  . Nonocclusive coronary atherosclerosis of native coronary artery 10/04/2019  . Heart palpitations 10/04/2019  . Family history of heart disease 01/09/2019  . H/O bariatric surgery 01/09/2019  . Acute pyelonephritis 07/07/2016  . Sepsis (HCC) 07/06/2016  . Essential hypertension 07/06/2016  . Cystitis with hematuria 07/06/2016  . Lapband APL in [place] 2007 09/21/2013    Past Surgical History:  Procedure Laterality Date  . KNEE ARTHROSCOPY W/ ACL RECONSTRUCTION Left 2004  . LAPAROSCOPIC CHOLECYSTECTOMY  2005  . LAPAROSCOPIC GASTRIC BANDING  2007  .  TONSILLECTOMY  1984   Age 51  . TOTAL VAGINAL HYSTERECTOMY  age 24   Ovaries remain, for menorrhagia  . TUBAL LIGATION       OB History    Gravida  3   Para  2   Term  2   Preterm      AB  1   Living  2     SAB      IAB      Ectopic      Multiple      Live Births  2           Family History  Problem Relation Age of Onset  . Hypertension Mother   . Rheum arthritis Mother        Lupus  . Heart disease Mother   . Lupus Mother   . Diabetes Father   . Hypertension Father   . Stroke Father   . Hyperlipidemia Father   . Heart disease Father   . Lupus Brother   . Diabetes Brother   . Hyperlipidemia Brother   . Breast cancer Maternal Aunt 64       X 2, one in her 41's  . Lupus Maternal Aunt   . Breast cancer Cousin 36       maternal & paternal cousin  . Breast cancer Paternal Aunt 30       died in her 65's  . Lymphoma Son     Social  History   Tobacco Use  . Smoking status: Never Smoker  . Smokeless tobacco: Never Used  Vaping Use  . Vaping Use: Never used  Substance Use Topics  . Alcohol use: Yes    Alcohol/week: 1.0 standard drink    Types: 1 Standard drinks or equivalent per week    Comment: rarely  . Drug use: No    Home Medications Prior to Admission medications   Medication Sig Start Date End Date Taking? Authorizing Provider  ALPRAZolam Prudy Feeler(XANAX) 0.25 MG tablet Take 0.25 mg by mouth at bedtime as needed for anxiety.    [provider]  ALPRAZolam Prudy Feeler(XANAX) 0.25 MG tablet TAKE 1/2 TO 1 TABLET BY MOUTH ONCE A DAY AS NEEDED 05/10/20 11/06/20  Deatra JamesSun, Vyvyan, MD  amLODipine (NORVASC) 10 MG tablet Take 10 mg by mouth daily. 05/04/16   [provider]  amLODipine (NORVASC) 10 MG tablet TAKE 1 TABLET BY MOUTH ONCE DAILY 06/19/20   Deatra JamesSun, Vyvyan, MD  amLODipine (NORVASC) 10 MG tablet TAKE 1 TABLET BY MOUTH DAILY 12/18/19 12/17/20  Deatra JamesSun, Vyvyan, MD  atorvastatin (LIPITOR) 10 MG tablet TAKE 1 TABLET BY MOUTH ONCE DAILY 04/17/20 04/17/21  Deatra JamesSun,  Vyvyan, MD  cephALEXin (KEFLEX) 250 MG capsule Take 250 mg by mouth at bedtime. 06/17/17   [provider]  cephALEXin (KEFLEX) 250 MG capsule TAKE 1 CAPSULE BY MOUTH ONCE DAILY 09/15/19 09/14/20  Alfredo MartinezMacDiarmid, Scott, MD  Cholecalciferol (VITAMIN D) 2000 UNITS tablet Take 2,000 Units by mouth daily.    [provider]  desvenlafaxine (PRISTIQ) 50 MG 24 hr tablet Take 50 mg by mouth daily.    [provider]  desvenlafaxine (PRISTIQ) 50 MG 24 hr tablet TAKE 1 TABLET BY MOUTH ONCE A DAY 05/10/20 05/10/21  Deatra JamesSun, Vyvyan, MD  desvenlafaxine (PRISTIQ) 50 MG 24 hr tablet TAKE 1 TABLET BY MOUTH DAILY 10/17/19 10/16/20  Deatra JamesSun, Vyvyan, MD  lisinopril-hydrochlorothiazide (PRINZIDE,ZESTORETIC) 20-25 MG per tablet Take 1 tablet by mouth daily.    [provider]  lisinopril-hydrochlorothiazide (ZESTORETIC) 20-25 MG tablet TAKE 1 TABLET BY MOUTH EVERY DAY 06/10/20 06/10/21  Deatra JamesSun, Vyvyan, MD  lisinopril-hydrochlorothiazide (ZESTORETIC) 20-25 MG tablet TAKE 1 TABLET BY MOUTH EVERY DAY 10/17/19 10/16/20  Deatra JamesSun, Vyvyan, MD  Multiple Vitamins-Minerals (MULTIVITAMIN WITH MINERALS) tablet Take 1 tablet by mouth daily.    [provider]  nystatin cream (MYCOSTATIN) APPLY TO AFFECTED AREA TWICE DAILY FOR UP TO 7 DAYS 05/29/20 05/29/21  Romualdo BolkJertson, Jill Evelyn, MD  oxybutynin (DITROPAN-XL) 10 MG 24 hr tablet TAKE 1 TABLET (10 MG TOTAL) BY MOUTH AT BEDTIME. 05/29/20 05/29/21  Romualdo BolkJertson, Jill Evelyn, MD  oxyCODONE-acetaminophen (PERCOCET) 10-325 MG tablet Take 1 tablet by mouth every 8 (eight) hours as needed. 06/09/16   [provider]  oxyCODONE-acetaminophen (PERCOCET) 10-325 MG tablet TAKE 1 TABLET BY MOUTH EVERY 8 HOURS AS NEEDED FOR 30 DAYS 06/05/20 12/02/20  Aletha Halimunheim, Andreas, MD  oxyCODONE-acetaminophen (PERCOCET) 10-325 MG tablet TAKE 1 TABLET BY MOUTH EVERY 8 HOURS AS NEEDED FOR 30 DAYS 05/09/20 11/05/20  Aletha Halimunheim, Andreas, MD  oxyCODONE-acetaminophen (PERCOCET) 10-325 MG tablet TAKE 1 TABLET BY  MOUTH EVERY 8 HOURS AS NEEDED FOR 30 DAYS 04/11/20 10/08/20  Aletha Halimunheim, Andreas, MD  oxyCODONE-acetaminophen (PERCOCET) 10-325 MG tablet TAKE 1 TABLET BY MOUTH EVERY 8 HOURS AS NEEDED 03/08/20 09/04/20  Aletha Halimunheim, Andreas, MD  oxyCODONE-acetaminophen (PERCOCET) 10-325 MG tablet TAKE 1 TABLET BY MOUTH EVERY 8 HOURS AS NEEDED 02/14/20 08/12/20  Aletha Halimunheim, Andreas, MD  oxyCODONE-acetaminophen (PERCOCET) 10-325 MG tablet TAKE 1 TABLET BY  MOUTH EVERY 8 HOURS AS NEEDED. 01/18/20 07/16/20  Aletha Halim, MD  oxyCODONE-acetaminophen (PERCOCET) 10-325 MG tablet Take by mouth 3 (three) times daily as needed. 07/08/20     topiramate (TOPAMAX) 100 MG tablet Take 100 mg by mouth 2 (two) times daily. 06/12/16   [provider]  topiramate (TOPAMAX) 100 MG tablet TAKE 1 TABLET BY MOUTH TWICE A DAY 02/28/20 02/27/21  Aletha Halim, MD  traZODone (DESYREL) 50 MG tablet Take 50 mg by mouth at bedtime as needed for sleep.    [provider]    Allergies    Patient has no known allergies.  Review of Systems   Review of Systems  Constitutional: Negative for chills and fever.  HENT: Negative for sore throat.   Eyes: Negative for redness.  Respiratory: Negative for cough and shortness of breath.   Cardiovascular: Positive for chest pain. Negative for palpitations and leg swelling.  Gastrointestinal: Negative for abdominal pain, nausea and vomiting.  Genitourinary: Negative for flank pain.  Musculoskeletal: Negative for back pain and neck pain.  Skin: Negative for rash.  Neurological: Negative for headaches.  Hematological: Does not bruise/bleed easily.  Psychiatric/Behavioral: Negative for confusion.    Physical Exam Updated Vital Signs BP 133/80 (BP Location: Left Arm)   Pulse 74   Temp 98.5 F (36.9 C) (Oral)   Resp 18   Ht 1.651 m ( )   Wt 86.6 kg   SpO2 100%   BMI 31.78 kg/m   Physical Exam Vitals and nursing note reviewed.  Constitutional:      Appearance: Normal appearance.  She is well-developed.  HENT:     Head: Atraumatic.     Nose: Nose normal.     Mouth/Throat:     Mouth: Mucous membranes are moist.  Eyes:     General: No scleral icterus.    Conjunctiva/sclera: Conjunctivae normal.     Pupils: Pupils are equal, round, and reactive to light.  Neck:     Trachea: No tracheal deviation.  Cardiovascular:     Rate and Rhythm: Normal rate and regular rhythm.     Pulses: Normal pulses.     Heart sounds: Normal heart sounds. No murmur heard. No friction rub. No gallop.   Pulmonary:     Effort: Pulmonary effort is normal. No respiratory distress.     Breath sounds: Normal breath sounds.     Comments: Mild chest wall tenderness. No sts. No crepitus.  Chest:     Chest wall: Tenderness present.  Abdominal:     General: Bowel sounds are normal. There is no distension.     Palpations: Abdomen is soft.     Tenderness: There is no abdominal tenderness. There is no guarding.  Genitourinary:    Comments: No cva tenderness.  Musculoskeletal:        General: No swelling or tenderness.     Cervical back: Normal range of motion and neck supple. No rigidity. No muscular tenderness.     Right lower leg: No edema.     Left lower leg: No edema.  Skin:    General: Skin is warm and dry.     Findings: No rash.  Neurological:     Mental Status: She is alert.     Comments: Alert, speech normal.   Psychiatric:        Mood and Affect: Mood normal.     ED Results / Procedures / Treatments   Labs (all labs ordered are listed, but only abnormal results are  displayed) Results for orders placed or performed during the hospital encounter of 07/15/20  CBC  Result Value Ref Range   WBC 5.9 4.0 - 10.5 K/uL   RBC 5.22 (H) 3.87 - 5.11 MIL/uL   Hemoglobin 12.7 12.0 - 15.0 g/dL   HCT 46.6 59.9 - 35.7 %   MCV 78.5 (L) 80.0 - 100.0 fL   MCH 24.3 (L) 26.0 - 34.0 pg   MCHC 31.0 30.0 - 36.0 g/dL   RDW 01.7 (H) 79.3 - 90.3 %   Platelets 237 150 - 400 K/uL   nRBC 0.0 0.0 -  0.2 %  Comprehensive metabolic panel  Result Value Ref Range   Sodium 140 135 - 145 mmol/L   Potassium 3.7 3.5 - 5.1 mmol/L   Chloride 105 98 - 111 mmol/L   CO2 29 22 - 32 mmol/L   Glucose, Bld 87 70 - 99 mg/dL   BUN 15 6 - 20 mg/dL   Creatinine, Ser 0.09 0.44 - 1.00 mg/dL   Calcium 9.7 8.9 - 23.3 mg/dL   Total Protein 6.9 6.5 - 8.1 g/dL   Albumin 4.1 3.5 - 5.0 g/dL   AST 18 15 - 41 U/L   ALT 21 0 - 44 U/L   Alkaline Phosphatase 41 38 - 126 U/L   Total Bilirubin 0.5 0.3 - 1.2 mg/dL   GFR, Estimated >00 >76 mL/min   Anion gap 6 5 - 15  Troponin I (High Sensitivity)  Result Value Ref Range   Troponin I (High Sensitivity) 6 <18 ng/L   EKG EKG Interpretation  Date/Time:  Monday July 15 2020 13:07:55 EDT Ventricular Rate:  77 PR Interval:  142 QRS Duration: 74 QT Interval:  356 QTC Calculation: 402 R Axis:   -28 Text Interpretation: Sinus rhythm with occasional Premature ventricular complexes Otherwise normal ECG Confirmed by Cathren Laine ([id number]) on 07/15/2020 1:14:19 PM   Radiology DG Chest 2 View  Result Date: 07/15/2020 CLINICAL DATA:  Back pain radiating to the chest. Shortness of breath. EXAM: CHEST - 2 VIEW COMPARISON:  Chest radiographs and CTA 10/29/2018 FINDINGS: The cardiomediastinal silhouette is within normal limits. The lungs are well inflated and clear. There is no evidence of pleural effusion or pneumothorax. No acute osseous abnormality is identified. IMPRESSION: No active cardiopulmonary disease. Electronically Signed   By: Sebastian Ache M.D.   On: 07/15/2020 14:25    Procedures Procedures   Medications Ordered in ED Medications - No data to display  ED Course  I have reviewed the triage vital signs and the nursing notes.  Pertinent labs & imaging results that were available during my care of the patient were reviewed by me and considered in my medical decision making (see chart for details).    MDM Rules/Calculators/A&P                          Iv  ns. Continuous pulse ox and cardiac monitoring. Stat labs. Imaging. Ecg.  Reviewed nursing notes and prior charts for additional history.   Labs reviewed/interpreted by me - wbc normal.  CXR reviewed/interpreted by me - no pna.   Additional labs reviewed/interpreted by me - initial trop normal.  Recheck no chest pain or sob.   1640, delta trop pending - signed out to Dr Stevie Kern  - if 2nd trop normal, not increasing, plan for d/c to home, outpt cardiology f/u.     Final Clinical Impression(s) / ED Diagnoses Final diagnoses:  None  Rx / DC Orders ED Discharge Orders    None       Cathren Laine, MD 07/15/20 1642

## 2020-07-17 ENCOUNTER — Other Ambulatory Visit (HOSPITAL_COMMUNITY): Payer: Self-pay

## 2020-07-17 MED FILL — Atorvastatin Calcium Tab 10 MG (Base Equivalent): ORAL | 90 days supply | Qty: 90 | Fill #0 | Status: AC

## 2020-07-18 ENCOUNTER — Other Ambulatory Visit (HOSPITAL_COMMUNITY): Payer: Self-pay

## 2020-07-18 MED ORDER — TOPIRAMATE 100 MG PO TABS
ORAL_TABLET | ORAL | 6 refills | Status: DC
  Filled 2020-07-18: qty 60, 30d supply, fill #0
  Filled 2020-08-24: qty 60, 30d supply, fill #1
  Filled 2020-11-08: qty 60, 30d supply, fill #2
  Filled 2020-12-11: qty 60, 30d supply, fill #3
  Filled 2021-01-09: qty 60, 30d supply, fill #4
  Filled 2021-02-26: qty 60, 30d supply, fill #5
  Filled 2021-04-01: qty 60, 30d supply, fill #6

## 2020-07-19 ENCOUNTER — Other Ambulatory Visit (HOSPITAL_COMMUNITY): Payer: Self-pay

## 2020-07-22 ENCOUNTER — Other Ambulatory Visit (HOSPITAL_COMMUNITY): Payer: Self-pay

## 2020-08-05 NOTE — Progress Notes (Signed)
Cardiology Office Note:    Date:  08/07/2020   ID:  Samantha Molina, DOB 09-26-1960, MRN [id number]  PCP:  Donald Prose, MD  Cardiologist:  Buford Dresser, MD  Referring MD: Donald Prose, MD   CC: Hospital follow-up  History of Present Illness:    Samantha Molina is a 60 y.o. female with a hx of hypertension, depression/anxiety who is seen for follow up. I initially met her 01/09/19 as a new consult at the request of Donald Prose, MD for the evaluation and management of chest pain.  Chest pain history: had full workup and was told everything was fine. Happened when she was living in [place], was 300 lbs at the time. Had lap-band done in 2007, lost a huge amount weight. Has been vigilant about diet and exercise. Had been fine with no further chest pain until most recent episode and then recurrent episode. Occurred 10/28/18, sudden onset around 13 PM. Aching, substernal, associated with shortness of breath and right jaw pain. Seen in ER 10/29/18 when pain had been constant for several hours. D dimer elevated but CTPE negative. hsTn 3 -> 3. ECG nonischemic. CT cardiac with mild nonobstructive disease 02/2019.  Today: Reviewed episode that brought her into the ER. Scariest symptom was that she could not catch her breath. Also had odd sensation in her right arm at the time, midsternal chest pain. Workup unrevealing. Has been having a stressful time with family issues.  We reviewed her prior workup at length.  Denies other shortness of breath at rest or with normal exertion. No PND, orthopnea, LE edema or unexpected weight gain. No syncope or palpitations.   Past Medical History:  Diagnosis Date  . Arthritis   . Arthritis    both knees  . Chronic leg pain   . Coronary artery disease   . Depression   . Diabetes mellitus without complication ([place])   . Hypertension   . Migraine without aura   . Morbid obesity ([place])   . Urinary incontinence   . Vitamin D deficiency disease 2013     Past Surgical History:  Procedure Laterality Date  . KNEE ARTHROSCOPY W/ ACL RECONSTRUCTION Left 2004  . LAPAROSCOPIC CHOLECYSTECTOMY  2005  . LAPAROSCOPIC GASTRIC BANDING  2007  . TONSILLECTOMY  1984   Age 24  . TOTAL VAGINAL HYSTERECTOMY  age 64   Ovaries remain, for menorrhagia  . TUBAL LIGATION      Current Medications: Current Outpatient Medications on File Prior to Visit  Medication Sig  . ALPRAZolam (XANAX) 0.25 MG tablet Take 0.25 mg by mouth at bedtime as needed for anxiety.  Marland Kitchen amLODipine (NORVASC) 10 MG tablet TAKE 1 TABLET BY MOUTH ONCE DAILY  . atorvastatin (LIPITOR) 10 MG tablet TAKE 1 TABLET BY MOUTH ONCE DAILY  . cephALEXin (KEFLEX) 250 MG capsule Take 250 mg by mouth at bedtime.  . Cholecalciferol (VITAMIN D) 2000 UNITS tablet Take 2,000 Units by mouth daily.  Marland Kitchen desvenlafaxine (PRISTIQ) 50 MG 24 hr tablet Take 50 mg by mouth daily.  Marland Kitchen lisinopril-hydrochlorothiazide (ZESTORETIC) 20-25 MG tablet TAKE 1 TABLET BY MOUTH EVERY DAY  . Multiple Vitamins-Minerals (MULTIVITAMIN WITH MINERALS) tablet Take 1 tablet by mouth daily.  Marland Kitchen nystatin cream (MYCOSTATIN) APPLY TO AFFECTED AREA TWICE DAILY FOR UP TO 7 DAYS  . oxybutynin (DITROPAN-XL) 10 MG 24 hr tablet TAKE 1 TABLET (10 MG TOTAL) BY MOUTH AT BEDTIME.  Marland Kitchen oxyCODONE-acetaminophen (PERCOCET) 10-325 MG tablet Take 1 tablet by mouth every 8 hours as needed  .  topiramate (TOPAMAX) 100 MG tablet 1 po bid for 30 days  . traZODone (DESYREL) 50 MG tablet Take 50 mg by mouth at bedtime as needed for sleep.   No current facility-administered medications on file prior to visit.     Allergies:   Patient has no known allergies.   Social History   Tobacco Use  . Smoking status: Never Smoker  . Smokeless tobacco: Never Used  Vaping Use  . Vaping Use: Never used  Substance Use Topics  . Alcohol use: Yes    Alcohol/week: 1.0 standard drink    Types: 1 Standard drinks or equivalent per week    Comment: rarely  . Drug use: No     Family History: family history includes Breast cancer (age of onset: 49) in her paternal aunt; Breast cancer (age of onset: 22) in her cousin; Breast cancer (age of onset: 77) in her maternal aunt; Diabetes in her brother and father; Heart disease in her father and mother; Hyperlipidemia in her brother and father; Hypertension in her father and mother; Lupus in her brother, maternal aunt, and mother; Lymphoma in her son; Rheum arthritis in her mother; Stroke in her father.  Family history: father passed away from MI at age 93. Brother died age 32 from lupus/kidney disease, was on dialysis. Mother: alive age 36, has heart disease, hypertension, arthritis Father: deceased age 17, heart disease, diabetes, hypertension Mat Gma: heart disease, hypertension Mat Gpa: heart disease, hypertension Pat Gma: cancer Pat Gpa: diabetes, heart disease, hypertension Brother: alive age 15, obese, in SNF  ROS:   Please see the history of present illness.   Additional pertinent ROS otherwise unremarkable.  EKGs/Labs/Other Studies Reviewed:    The following studies were reviewed today:  Monitor 2021: ~7 days of data recorded on Zio monitor. Patient had a min HR of 47 bpm, max HR of 187 bpm, and avg HR of 77 bpm. Predominant underlying rhythm was Sinus Rhythm. No atrial fibrillation, high degree block, or pauses noted. One brief episode of NSVT, 4 beats. 3 SVT events noted, no longer than 4 beats. Isolated atrial ectopy was rare (<1%). Isolated ventricular ectopy was occasional (4.3%). There were 32 triggered events. These were sinus with frequent association to PVCs.  Echo 10/24/19 1. Normal LV function; there appears to be a small aneurysmal segment in  the membranous septum but no obvious flow suggestive of residual VSD.  2. Left ventricular ejection fraction, by estimation, is 60 to 65%. The  left ventricle has normal function. The left ventricle has no regional  wall motion abnormalities. Left  ventricular diastolic parameters were  normal.  3. Right ventricular systolic function is normal. The right ventricular  size is normal. There is normal pulmonary artery systolic pressure.  4. The mitral valve is normal in structure. Mild mitral valve  regurgitation. No evidence of mitral stenosis.  5. The aortic valve is tricuspid. Aortic valve regurgitation is trivial.  No aortic stenosis is present.  6. The inferior vena cava is normal in size with greater than 50%  respiratory variability, suggesting right atrial pressure of 3 mmHg.   CT cardiac 03/01/19 Coronary calcium score: The patient's coronary artery calcium score is 114, which places the patient in the 94th percentile.  Coronary arteries: Normal coronary origins.  Right dominance.  Right Coronary Artery: Normal caliber vessel, gives rise to PDA. Mild ostial noncalcified plaque with 1-24% stenosis.  Left Main Coronary Artery: Normal caliber vessel. No significant plaque or stenosis.  Left Anterior Descending Coronary  Artery: Normal caliber vessel. Proximal LAD has focal calcified plaque with 25-49% luminal stenosis. Gives rise to 3 diagonal branches. Distal LAD wraps apex  Left Circumflex Artery: Normal caliber vessel. No significant plaque or stenosis. Gives rise to 1 OM branch.  Aorta: Normal size, 31 mm at the mid ascending aorta (level of the PA bifurcation) measured double oblique. Trivial calcifications. No dissection.  Aortic Valve: No calcifications. Trileaflet.  Other findings: Normal pulmonary vein drainage into the left atrium. Normal left atrial appendage without a thrombus. Normal size of the pulmonary artery. Appears to have a small VSD, likely perimembranous.  IMPRESSION: 1. Mild nonobstructive CAD, CADRADS = 2. 2. Coronary calcium score of 114. This was 94th percentile for age and sex matched control. 3. Normal coronary origin with right dominance. 4. Likely perimembranous  VSD.  EKG:   08/07/2020: sinus rhythm at 88 bpm with 3 pvcs (nonconsecutive) 10/06/19: NSR at 68 bpm  Recent Labs: 07/15/2020: ALT 21; BUN 15; Creatinine, Ser 0.76; Hemoglobin 12.7; Platelets 237; Potassium 3.7; Sodium 140  Recent Lipid Panel No results found for: CHOL, TRIG, HDL, CHOLHDL, VLDL, LDLCALC, LDLDIRECT  Physical Exam:    VS:  BP 138/84   Pulse 88   Ht 5' 5"  (1.651 m)   Wt 194 lb 12.8 oz (88.4 kg)   SpO2 99%   BMI 32.42 kg/m     Wt Readings from Last 3 Encounters:  08/07/20 194 lb 12.8 oz (88.4 kg)  07/15/20 191 lb (86.6 kg)  05/29/20 189 lb (85.7 kg)    GEN: Well nourished, well developed in no acute distress HEENT: Normal, moist mucous membranes NECK: No JVD CARDIAC: regular rhythm, normal S1 and S2, no rubs or gallops. No murmur. VASCULAR: Radial and DP pulses 2+ bilaterally. No carotid bruits RESPIRATORY:  Clear to auscultation without rales, wheezing or rhonchi  ABDOMEN: Soft, non-tender, non-distended MUSCULOSKELETAL:  Ambulates independently SKIN: Warm and dry, no edema NEUROLOGIC:  Alert and oriented x 3. No focal neuro deficits noted. PSYCHIATRIC:  Normal affect   ASSESSMENT:    1. Other chest pain   2. Nonocclusive coronary atherosclerosis of native coronary artery   3. Essential hypertension   4. Cardiac risk counseling   5. Counseling on health promotion and disease prevention   6. Heart palpitations    PLAN:    Chest pain/shortness of breath: -reviewed CT cardiac results again, mild nonobstructive CAD, should not be the source of chest pain -high suspicion for noncardiac cause -unremarkable workup in ER -high stress level -discussed red flag warning signs that need immediate medical attention  Nonobstructive CAD Family history of heart disease: -we discussed recommendations for statin and aspirin given nonobstructive CAD -continue atorvastatin, started recently. Continue to discuss aspirin  Palpitations, possible VSD -monitor with 4%  PVC burden, symptoms associate with PVCs -discussed either beta blocker or change in calcium channel blocker. She would like to avoid medications unless it becomes more bothersome -echo largely normal, no VSD flow seen  Hypertension: Goal <130/80, near goal today Continue amlodipine, lisinopril-HCTZ Discussed lifestyle  Prior obesity, now s/p lap band  -has gained some weight, working on re-intensifying diet and exercise  Cardiac risk counseling and prevention recommendations: -recommend heart healthy/Mediterranean diet, with whole grains, fruits, vegetable, fish, lean meats, nuts, and olive oil. Limit salt. -recommend moderate walking, 3-5 times/week for 30-50 minutes each session. Aim for at least 150 minutes.week. Goal should be pace of 3 miles/hours, or walking 1.5 miles in 30 minutes -recommend avoidance of tobacco products. Avoid  excess alcohol.  Plan for follow up: 1 year or sooner PRN  Medication Adjustments/Labs and Tests Ordered: Current medicines are reviewed at length with the patient today.  Concerns regarding medicines are outlined above.  Orders Placed This Encounter  Procedures  . EKG 12-Lead   No orders of the defined types were placed in this encounter.   Patient Instructions  Medication Instructions:  Your Physician recommend you continue on your current medication as directed.    *If you need a refill on your cardiac medications before your next appointment, please call your pharmacy*   Lab Work: None   Testing/Procedures: None   Follow-Up: At [facility], you and your health needs are our priority.  As part of our continuing mission to provide you with exceptional heart care, we have created designated Provider Care Teams.  These Care Teams include your primary Cardiologist (physician) and Advanced Practice Providers (APPs -  Physician Assistants and Nurse Practitioners) who all work together to provide you with the care you need, when you need  it.  We recommend signing up for the patient portal called "MyChart".  Sign up information is provided on this After Visit Summary.  MyChart is used to connect with patients for Virtual Visits (Telemedicine).  Patients are able to view lab/test results, encounter notes, upcoming appointments, etc.  Non-urgent messages can be sent to your provider as well.   To learn more about what you can do with MyChart, go to [email or web address].    Your next appointment:   1 year(s) @ [address] [place], [place] [address]   The format for your next appointment:   In Person  Provider:   Buford Dresser, MD        Signed, Buford Dresser, MD PhD 08/07/2020     [place]

## 2020-08-06 ENCOUNTER — Other Ambulatory Visit (HOSPITAL_COMMUNITY): Payer: Self-pay

## 2020-08-06 DIAGNOSIS — H5213 Myopia, bilateral: Secondary | ICD-10-CM | POA: Diagnosis not present

## 2020-08-06 MED ORDER — OXYCODONE-ACETAMINOPHEN 10-325 MG PO TABS
ORAL_TABLET | ORAL | 0 refills | Status: DC
  Filled 2020-08-06: qty 90, 30d supply, fill #0

## 2020-08-07 ENCOUNTER — Ambulatory Visit (INDEPENDENT_AMBULATORY_CARE_PROVIDER_SITE_OTHER): Payer: 59 | Admitting: Cardiology

## 2020-08-07 ENCOUNTER — Other Ambulatory Visit: Payer: Self-pay

## 2020-08-07 ENCOUNTER — Encounter: Payer: Self-pay | Admitting: Cardiology

## 2020-08-07 VITALS — BP 138/84 | HR 88 | Ht 65.0 in | Wt 194.8 lb

## 2020-08-07 DIAGNOSIS — R002 Palpitations: Secondary | ICD-10-CM | POA: Diagnosis not present

## 2020-08-07 DIAGNOSIS — I2510 Atherosclerotic heart disease of native coronary artery without angina pectoris: Secondary | ICD-10-CM

## 2020-08-07 DIAGNOSIS — Z7189 Other specified counseling: Secondary | ICD-10-CM

## 2020-08-07 DIAGNOSIS — I10 Essential (primary) hypertension: Secondary | ICD-10-CM

## 2020-08-07 DIAGNOSIS — R0789 Other chest pain: Secondary | ICD-10-CM

## 2020-08-07 NOTE — Patient Instructions (Addendum)
Medication Instructions:  Your Physician recommend you continue on your current medication as directed.    *If you need a refill on your cardiac medications before your next appointment, please call your pharmacy*   Lab Work: None   Testing/Procedures: None   Follow-Up: At [facility], you and your health needs are our priority.  As part of our continuing mission to provide you with exceptional heart care, we have created designated Provider Care Teams.  These Care Teams include your primary Cardiologist (physician) and Advanced Practice Providers (APPs -  Physician Assistants and Nurse Practitioners) who all work together to provide you with the care you need, when you need it.  We recommend signing up for the patient portal called "MyChart".  Sign up information is provided on this After Visit Summary.  MyChart is used to connect with patients for Virtual Visits (Telemedicine).  Patients are able to view lab/test results, encounter notes, upcoming appointments, etc.  Non-urgent messages can be sent to your provider as well.   To learn more about what you can do with MyChart, go to [email or web address].    Your next appointment:   1 year(s) @ [address] [place], [place] [address]  The format for your next appointment:   In Person  Provider:   Bridgette Christopher, MD   

## 2020-08-08 ENCOUNTER — Ambulatory Visit (INDEPENDENT_AMBULATORY_CARE_PROVIDER_SITE_OTHER): Payer: 59 | Admitting: Psychology

## 2020-08-08 ENCOUNTER — Other Ambulatory Visit (HOSPITAL_COMMUNITY): Payer: Self-pay

## 2020-08-08 DIAGNOSIS — F419 Anxiety disorder, unspecified: Secondary | ICD-10-CM

## 2020-08-13 ENCOUNTER — Other Ambulatory Visit (HOSPITAL_COMMUNITY): Payer: Self-pay

## 2020-08-26 ENCOUNTER — Other Ambulatory Visit (HOSPITAL_COMMUNITY): Payer: Self-pay

## 2020-08-27 ENCOUNTER — Other Ambulatory Visit (HOSPITAL_COMMUNITY): Payer: Self-pay

## 2020-08-29 ENCOUNTER — Other Ambulatory Visit: Payer: Self-pay | Admitting: Urology

## 2020-08-29 ENCOUNTER — Other Ambulatory Visit (HOSPITAL_COMMUNITY): Payer: Self-pay

## 2020-08-29 MED ORDER — DESVENLAFAXINE SUCCINATE ER 50 MG PO TB24
50.0000 mg | ORAL_TABLET | Freq: Every day | ORAL | 0 refills | Status: DC
  Filled 2020-08-29: qty 90, 90d supply, fill #0

## 2020-08-29 MED ORDER — ATORVASTATIN CALCIUM 10 MG PO TABS
10.0000 mg | ORAL_TABLET | Freq: Every day | ORAL | 0 refills | Status: DC
  Filled 2020-08-29 – 2020-10-21 (×2): qty 90, 90d supply, fill #0

## 2020-08-29 MED FILL — Lisinopril & Hydrochlorothiazide Tab 20-25 MG: ORAL | 90 days supply | Qty: 90 | Fill #0 | Status: AC

## 2020-08-29 MED FILL — Oxybutynin Chloride Tab ER 24HR 10 MG: ORAL | 90 days supply | Qty: 90 | Fill #0 | Status: AC

## 2020-08-30 ENCOUNTER — Other Ambulatory Visit (HOSPITAL_COMMUNITY): Payer: Self-pay

## 2020-09-04 ENCOUNTER — Other Ambulatory Visit (HOSPITAL_COMMUNITY): Payer: Self-pay

## 2020-09-05 ENCOUNTER — Other Ambulatory Visit (HOSPITAL_COMMUNITY): Payer: Self-pay

## 2020-09-05 MED ORDER — OXYCODONE-ACETAMINOPHEN 10-325 MG PO TABS
ORAL_TABLET | ORAL | 0 refills | Status: DC
  Filled 2020-09-05: qty 90, 30d supply, fill #0

## 2020-09-07 ENCOUNTER — Other Ambulatory Visit (HOSPITAL_COMMUNITY): Payer: Self-pay

## 2020-09-18 ENCOUNTER — Encounter: Payer: Self-pay | Admitting: Gastroenterology

## 2020-09-26 ENCOUNTER — Encounter: Payer: Self-pay | Admitting: Gastroenterology

## 2020-09-26 ENCOUNTER — Other Ambulatory Visit (HOSPITAL_COMMUNITY): Payer: Self-pay

## 2020-09-26 DIAGNOSIS — G43009 Migraine without aura, not intractable, without status migrainosus: Secondary | ICD-10-CM | POA: Diagnosis not present

## 2020-09-26 DIAGNOSIS — F419 Anxiety disorder, unspecified: Secondary | ICD-10-CM | POA: Diagnosis not present

## 2020-09-26 DIAGNOSIS — M5417 Radiculopathy, lumbosacral region: Secondary | ICD-10-CM | POA: Diagnosis not present

## 2020-09-26 DIAGNOSIS — Z79899 Other long term (current) drug therapy: Secondary | ICD-10-CM | POA: Diagnosis not present

## 2020-09-26 MED ORDER — TOPIRAMATE 100 MG PO TABS
100.0000 mg | ORAL_TABLET | Freq: Two times a day (BID) | ORAL | 6 refills | Status: DC
  Filled 2020-09-26: qty 60, 30d supply, fill #0

## 2020-10-02 ENCOUNTER — Other Ambulatory Visit (HOSPITAL_COMMUNITY): Payer: Self-pay

## 2020-10-02 MED ORDER — OXYCODONE-ACETAMINOPHEN 10-325 MG PO TABS
ORAL_TABLET | ORAL | 0 refills | Status: DC
  Filled 2020-10-05: qty 90, 30d supply, fill #0

## 2020-10-03 ENCOUNTER — Other Ambulatory Visit (HOSPITAL_COMMUNITY): Payer: Self-pay

## 2020-10-05 ENCOUNTER — Other Ambulatory Visit (HOSPITAL_COMMUNITY): Payer: Self-pay

## 2020-10-07 ENCOUNTER — Other Ambulatory Visit (HOSPITAL_COMMUNITY): Payer: Self-pay

## 2020-10-15 ENCOUNTER — Other Ambulatory Visit (HOSPITAL_COMMUNITY): Payer: Self-pay

## 2020-10-15 DIAGNOSIS — E1169 Type 2 diabetes mellitus with other specified complication: Secondary | ICD-10-CM | POA: Diagnosis not present

## 2020-10-15 DIAGNOSIS — I10 Essential (primary) hypertension: Secondary | ICD-10-CM | POA: Diagnosis not present

## 2020-10-15 DIAGNOSIS — I700 Atherosclerosis of aorta: Secondary | ICD-10-CM | POA: Diagnosis not present

## 2020-10-15 DIAGNOSIS — F320 Major depressive disorder, single episode, mild: Secondary | ICD-10-CM | POA: Diagnosis not present

## 2020-10-15 MED ORDER — LISINOPRIL-HYDROCHLOROTHIAZIDE 20-25 MG PO TABS
1.0000 | ORAL_TABLET | Freq: Every day | ORAL | 1 refills | Status: DC
  Filled 2020-10-15 – 2020-12-11 (×2): qty 90, 90d supply, fill #0
  Filled 2021-03-10: qty 90, 90d supply, fill #1

## 2020-10-15 MED ORDER — ATORVASTATIN CALCIUM 10 MG PO TABS
10.0000 mg | ORAL_TABLET | Freq: Every day | ORAL | 1 refills | Status: DC
  Filled 2020-10-15: qty 90, 90d supply, fill #0

## 2020-10-15 MED ORDER — AMLODIPINE BESYLATE 10 MG PO TABS
10.0000 mg | ORAL_TABLET | Freq: Every day | ORAL | 1 refills | Status: DC
  Filled 2020-10-15 – 2020-10-16 (×2): qty 90, 90d supply, fill #0

## 2020-10-15 MED ORDER — DESVENLAFAXINE SUCCINATE ER 50 MG PO TB24
50.0000 mg | ORAL_TABLET | Freq: Every day | ORAL | 1 refills | Status: DC
  Filled 2020-10-15: qty 90, 90d supply, fill #0

## 2020-10-15 MED ORDER — ALPRAZOLAM 0.25 MG PO TABS
0.1250 mg | ORAL_TABLET | Freq: Every day | ORAL | 2 refills | Status: AC | PRN
  Filled 2020-10-15 – 2020-10-16 (×2): qty 30, 30d supply, fill #0
  Filled 2021-01-09: qty 30, 30d supply, fill #1

## 2020-10-16 ENCOUNTER — Other Ambulatory Visit: Payer: Self-pay

## 2020-10-16 ENCOUNTER — Other Ambulatory Visit (HOSPITAL_COMMUNITY): Payer: Self-pay

## 2020-10-16 ENCOUNTER — Ambulatory Visit (AMBULATORY_SURGERY_CENTER): Payer: 59

## 2020-10-16 VITALS — Ht 65.0 in | Wt 189.0 lb

## 2020-10-16 DIAGNOSIS — Z1211 Encounter for screening for malignant neoplasm of colon: Secondary | ICD-10-CM

## 2020-10-16 MED ORDER — NA SULFATE-K SULFATE-MG SULF 17.5-3.13-1.6 GM/177ML PO SOLN
1.0000 | Freq: Once | ORAL | 0 refills | Status: AC
  Filled 2020-10-16: qty 354, 1d supply, fill #0

## 2020-10-16 NOTE — Progress Notes (Signed)
Patient's pre-visit was done today over the phone with the patient   Name,DOB and address verified.  Patient denies any allergies to Eggs and Soy. Patient denies any problems with anesthesia/sedation. Patient denies taking diet pills or blood thinners. No home Oxygen. Packet of Prep instructions mailed to patient including a copy of a consent form-pt is aware. Patient understands to call us back with any questions or concerns. Patient is aware of our care-partner policy and Covid-19 safety protocol.   EMMI education assigned to the patient for the procedure, sent to MyChart.   DM controlled by diet

## 2020-10-17 DIAGNOSIS — M1712 Unilateral primary osteoarthritis, left knee: Secondary | ICD-10-CM | POA: Diagnosis not present

## 2020-10-17 DIAGNOSIS — Z9889 Other specified postprocedural states: Secondary | ICD-10-CM | POA: Diagnosis not present

## 2020-10-17 DIAGNOSIS — M1732 Unilateral post-traumatic osteoarthritis, left knee: Secondary | ICD-10-CM | POA: Diagnosis not present

## 2020-10-18 ENCOUNTER — Other Ambulatory Visit (HOSPITAL_COMMUNITY): Payer: Self-pay

## 2020-10-18 MED FILL — Amlodipine Besylate Tab 10 MG (Base Equivalent): ORAL | 90 days supply | Qty: 90 | Fill #1 | Status: AC

## 2020-10-21 ENCOUNTER — Other Ambulatory Visit (HOSPITAL_COMMUNITY): Payer: Self-pay

## 2020-11-04 ENCOUNTER — Other Ambulatory Visit (HOSPITAL_COMMUNITY): Payer: Self-pay

## 2020-11-04 MED ORDER — OXYCODONE-ACETAMINOPHEN 10-325 MG PO TABS
ORAL_TABLET | ORAL | 0 refills | Status: DC
  Filled 2020-11-04: qty 90, 30d supply, fill #0

## 2020-11-05 ENCOUNTER — Other Ambulatory Visit (HOSPITAL_COMMUNITY): Payer: Self-pay

## 2020-11-08 ENCOUNTER — Other Ambulatory Visit (HOSPITAL_COMMUNITY): Payer: Self-pay

## 2020-11-12 ENCOUNTER — Encounter: Payer: Self-pay | Admitting: Gastroenterology

## 2020-11-15 ENCOUNTER — Encounter: Payer: Self-pay | Admitting: Gastroenterology

## 2020-11-15 ENCOUNTER — Other Ambulatory Visit: Payer: Self-pay

## 2020-11-15 ENCOUNTER — Ambulatory Visit (AMBULATORY_SURGERY_CENTER): Payer: 59 | Admitting: Gastroenterology

## 2020-11-15 VITALS — BP 143/83 | HR 68 | Temp 96.2°F | Resp 20 | Ht 65.0 in | Wt 189.0 lb

## 2020-11-15 DIAGNOSIS — I10 Essential (primary) hypertension: Secondary | ICD-10-CM | POA: Diagnosis not present

## 2020-11-15 DIAGNOSIS — Z1211 Encounter for screening for malignant neoplasm of colon: Secondary | ICD-10-CM

## 2020-11-15 MED ORDER — SODIUM CHLORIDE 0.9 % IV SOLN: 500.0000 mL | Freq: Once | INTRAVENOUS | Status: DC

## 2020-11-15 NOTE — Patient Instructions (Signed)
YOU HAD AN ENDOSCOPIC PROCEDURE TODAY AT THE [facility]:   Refer to the procedure report that was given to you for any specific questions about what was found during the examination.  If the procedure report does not answer your questions, please call your gastroenterologist to clarify.  If you requested that your care partner not be given the details of your procedure findings, then the procedure report has been included in a sealed envelope for you to review at your convenience later.  YOU SHOULD EXPECT: Some feelings of bloating in the abdomen. Passage of more gas than usual.  Walking can help get rid of the air that was put into your GI tract during the procedure and reduce the bloating. If you had a lower endoscopy (such as a colonoscopy or flexible sigmoidoscopy) you may notice spotting of blood in your stool or on the toilet paper. If you underwent a bowel prep for your procedure, you may not have a normal bowel movement for a few days.  Please Note:  You might notice some irritation and congestion in your nose or some drainage.  This is from the oxygen used during your procedure.  There is no need for concern and it should clear up in a day or so.  SYMPTOMS TO REPORT IMMEDIATELY:   Following lower endoscopy (colonoscopy or flexible sigmoidoscopy):  Excessive amounts of blood in the stool  Significant tenderness or worsening of abdominal pains  Swelling of the abdomen that is new, acute  Fever of 100F or higher  For urgent or emergent issues, a gastroenterologist can be reached at any hour by calling [phone]. Do not use MyChart messaging for urgent concerns.    DIET:  We do recommend a small meal at first, but then you may proceed to your regular diet.  Drink plenty of fluids but you should avoid alcoholic beverages for 24 hours.  ACTIVITY:  You should plan to take it easy for the rest of today and you should NOT DRIVE or use heavy machinery until tomorrow (because  of the sedation medicines used during the test).    FOLLOW UP: Our staff will call the number listed on your records 48-72 hours following your procedure to check on you and address any questions or concerns that you may have regarding the information given to you following your procedure. If we do not reach you, we will leave a message.  We will attempt to reach you two times.  During this call, we will ask if you have developed any symptoms of COVID 19. If you develop any symptoms (ie: fever, flu-like symptoms, shortness of breath, cough etc.) before then, please call [phone].  If you test positive for Covid 19 in the 2 weeks post procedure, please call and report this information to us.    If any biopsies were taken you will be contacted by phone or by letter within the next 1-3 weeks.  Please call us at [phone] if you have not heard about the biopsies in 3 weeks.    SIGNATURES/CONFIDENTIALITY: You and/or your care partner have signed paperwork which will be entered into your electronic medical record.  These signatures attest to the fact that that the information above on your After Visit Summary has been reviewed and is understood.  Full responsibility of the confidentiality of this discharge information lies with you and/or your care-partner. 

## 2020-11-15 NOTE — Progress Notes (Signed)
Pt's states no medical or surgical changes since previsit or office visit.  ° °Cw vitals  °

## 2020-11-15 NOTE — Op Note (Signed)
[facility] Patient Name: Samantha Molina Procedure Date: 11/15/2020 11:31 AM MRN: [id number] Endoscopist: Rachael Fee , MD Age: 60 Referring MD:  Date of Birth: 1960/03/28 Gender: Female Account #: [id number] Procedure:                Colonoscopy Indications:              Screening for colorectal malignant neoplasm Medicines:                Monitored Anesthesia Care Procedure:                Pre-Anesthesia Assessment:                           - Prior to the procedure, a History and Physical                            was performed, and patient medications and                            allergies were reviewed. The patient's tolerance of                            previous anesthesia was also reviewed. The risks                            and benefits of the procedure and the sedation                            options and risks were discussed with the patient.                            All questions were answered, and informed consent                            was obtained. Prior Anticoagulants: The patient has                            taken no previous anticoagulant or antiplatelet                            agents. ASA Grade Assessment: II - A patient with                            mild systemic disease. After reviewing the risks                            and benefits, the patient was deemed in                            satisfactory condition to undergo the procedure.                           After obtaining informed consent, the colonoscope  was passed under direct vision. Throughout the                            procedure, the patient's blood pressure, pulse, and                            oxygen saturations were monitored continuously. The                            Olympus CF-HQ190L [id number]) Colonoscope was                            introduced through the anus and advanced to the the                            cecum, identified  by appendiceal orifice and                            ileocecal valve. The colonoscopy was performed                            without difficulty. The patient tolerated the                            procedure well. The quality of the bowel                            preparation was good. The ileocecal valve,                            appendiceal orifice, and rectum were photographed. Scope In: 11:35:13 AM Scope Out: 11:54:33 AM Scope Withdrawal Time: 0 hours 7 minutes 51 seconds  Total Procedure Duration: 0 hours 19 minutes 20 seconds  Findings:                 The entire examined colon appeared normal on direct                            and retroflexion views. Complications:            No immediate complications. Estimated blood loss:                            None. Estimated Blood Loss:     Estimated blood loss: none. Impression:               - The entire examined colon is normal on direct and                            retroflexion views.                           - No polyps or cancers. Recommendation:           - Patient has a contact number available for  emergencies. The signs and symptoms of potential                            delayed complications were discussed with the                            patient. Return to normal activities tomorrow.                            Written discharge instructions were provided to the                            patient.                           - Resume previous diet.                           - Continue present medications.                           - Repeat colonoscopy in 10 years for screening. Rachael Fee, MD 11/15/2020 11:56:32 AM This report has been signed electronically.

## 2020-11-15 NOTE — Progress Notes (Signed)
HPI: This is a woman at routine risk for colon cancer   ROS: complete GI ROS as described in HPI, all other review negative.  Constitutional:  No unintentional weight loss   Past Medical History:  Diagnosis Date   Arthritis    Arthritis    both knees   Chronic leg pain    Coronary artery disease    Depression    Diabetes mellitus without complication (HCC)    Heart murmur    as a child   Hypertension    Migraine without aura    Morbid obesity (HCC)    Urinary incontinence    Vitamin D deficiency disease 2013    Past Surgical History:  Procedure Laterality Date   KNEE ARTHROSCOPY W/ ACL RECONSTRUCTION Left 2004   LAPAROSCOPIC CHOLECYSTECTOMY  2005   LAPAROSCOPIC GASTRIC BANDING  2007   TONSILLECTOMY  1984   Age 8   TOTAL VAGINAL HYSTERECTOMY  age 48   Ovaries remain, for menorrhagia   TUBAL LIGATION      Current Outpatient Medications  Medication Sig Dispense Refill   ALPRAZolam (XANAX) 0.25 MG tablet Take 0.5-1 tablets (0.125-0.25 mg total) by mouth daily as needed. 30 tablet 2   amLODipine (NORVASC) 10 MG tablet TAKE 1 TABLET BY MOUTH ONCE DAILY 90 tablet 1   atorvastatin (LIPITOR) 10 MG tablet Take 1 tablet (10 mg total) by mouth daily. 90 tablet 0   Cholecalciferol (VITAMIN D) 2000 UNITS tablet Take 2,000 Units by mouth daily.     desvenlafaxine (PRISTIQ) 50 MG 24 hr tablet Take 50 mg by mouth daily.     lisinopril-hydrochlorothiazide (ZESTORETIC) 20-25 MG tablet Take 1 tablet by mouth daily. 90 tablet 1   Multiple Vitamins-Minerals (MULTIVITAMIN WITH MINERALS) tablet Take 1 tablet by mouth daily.     oxybutynin (DITROPAN-XL) 10 MG 24 hr tablet TAKE 1 TABLET (10 MG TOTAL) BY MOUTH AT BEDTIME. 90 tablet 3   oxyCODONE-acetaminophen (PERCOCET) 10-325 MG tablet Take 1 tablet by mouth every 8 hours as needed 90 tablet 0   topiramate (TOPAMAX) 100 MG tablet 1 po bid for 30 days 60 tablet 6   cephALEXin (KEFLEX) 250 MG capsule Take 250 mg by mouth at bedtime. (Patient  not taking: No sig reported)  5   nystatin cream (MYCOSTATIN) APPLY TO AFFECTED AREA TWICE DAILY FOR UP TO 7 DAYS 30 g 0   traZODone (DESYREL) 50 MG tablet Take 50 mg by mouth at bedtime as needed for sleep. (Patient not taking: No sig reported)     Current Facility-Administered Medications  Medication Dose Route Frequency Provider Last Rate Last Admin   0.9 %  sodium chloride infusion  500 mL Intravenous Once Rachael Fee, MD        Allergies as of 11/15/2020   (No Known Allergies)    Family History  Problem Relation Age of Onset   Hypertension Mother    Rheum arthritis Mother        Lupus   Heart disease Mother    Lupus Mother    Diabetes Father    Hypertension Father    Stroke Father    Hyperlipidemia Father    Heart disease Father    Lupus Brother    Diabetes Brother    Hyperlipidemia Brother    Breast cancer Maternal Aunt 79       X 2, one in her 38's   Lupus Maternal Aunt    Breast cancer Paternal Aunt 84  died in her 62's   Lymphoma Son    Breast cancer Cousin 59       maternal & paternal cousin   Colon cancer Neg Hx    Colon polyps Neg Hx    Esophageal cancer Neg Hx    Stomach cancer Neg Hx    Rectal cancer Neg Hx     Social History   Socioeconomic History   Marital status: Married    Spouse name: Not on file   Number of children: Not on file   Years of education: Not on file   Highest education level: Not on file  Occupational History   Not on file  Tobacco Use   Smoking status: Never   Smokeless tobacco: Never  Vaping Use   Vaping Use: Never used  Substance and Sexual Activity   Alcohol use: Yes    Alcohol/week: 1.0 standard drink    Types: 1 Standard drinks or equivalent per week    Comment: rarely   Drug use: No   Sexual activity: Not Currently    Partners: Male    Birth control/protection: Surgical    Comment: TV[facility]  Other Topics Concern   Not on file  Social History Narrative   Not on file   Social Determinants of Health    Financial Resource Strain: Not on file  Food Insecurity: Not on file  Transportation Needs: Not on file  Physical Activity: Not on file  Stress: Not on file  Social Connections: Not on file  Intimate Partner Violence: Not on file     Physical Exam: BP 137/76   Pulse 98   Temp (!) 96.2 F (35.7 C)   Ht 5\' 5"  (1.651 m)   Wt 189 lb (85.7 kg)   SpO2 97%   BMI 31.45 kg/m  Constitutional: generally well-appearing Psychiatric: alert and oriented x3 Lungs: CTA bilaterally Heart: no MCR  Assessment and plan: 60 y.o. female with routine risk for colon cancer  Screening colonoscopy today  Care is appropriate for the ambulatory setting.  67, MD [facility] 11/15/2020, 11:26 AM

## 2020-11-15 NOTE — Progress Notes (Signed)
Report to PACU, RN, vss, BBS= Clear.  

## 2020-11-19 ENCOUNTER — Telehealth: Payer: Self-pay

## 2020-11-19 ENCOUNTER — Telehealth: Payer: Self-pay | Admitting: *Deleted

## 2020-11-19 NOTE — Telephone Encounter (Signed)
Follow up call made. 

## 2020-11-19 NOTE — Telephone Encounter (Signed)
  Follow up Call-  Call back number 11/15/2020  Post procedure Call Back phone  # [phone]  Permission to leave phone message Yes  Some recent data might be hidden     Patient questions:  Do you have a fever, pain , or abdominal swelling? No. Pain Score  0 *  Have you tolerated food without any problems? Yes.    Have you been able to return to your normal activities? Yes.    Do you have any questions about your discharge instructions: Diet   No. Medications  No. Follow up visit  No.  Do you have questions or concerns about your Care? No.  Actions: * If pain score is 4 or above: No action needed, pain <4. Have you developed a fever since your procedure? no  2.   Have you had an respiratory symptoms (SOB or cough) since your procedure? no  3.   Have you tested positive for COVID 19 since your procedure no  4.   Have you had any family members/close contacts diagnosed with the COVID 19 since your procedure?  no   If yes to any of these questions please route to Laverna Peace, RN and Karlton Lemon, RN

## 2020-12-03 ENCOUNTER — Other Ambulatory Visit (HOSPITAL_COMMUNITY): Payer: Self-pay

## 2020-12-03 MED ORDER — OXYCODONE-ACETAMINOPHEN 10-325 MG PO TABS
ORAL_TABLET | ORAL | 0 refills | Status: DC
  Filled 2020-12-04: qty 90, 30d supply, fill #0

## 2020-12-04 ENCOUNTER — Other Ambulatory Visit (HOSPITAL_COMMUNITY): Payer: Self-pay

## 2020-12-05 ENCOUNTER — Other Ambulatory Visit (HOSPITAL_COMMUNITY): Payer: Self-pay

## 2020-12-06 ENCOUNTER — Other Ambulatory Visit (HOSPITAL_COMMUNITY): Payer: Self-pay

## 2020-12-06 MED ORDER — DESVENLAFAXINE SUCCINATE ER 50 MG PO TB24
50.0000 mg | ORAL_TABLET | Freq: Every day | ORAL | 1 refills | Status: DC
  Filled 2020-12-06: qty 90, 90d supply, fill #0
  Filled 2021-03-10: qty 90, 90d supply, fill #1

## 2020-12-11 ENCOUNTER — Other Ambulatory Visit (HOSPITAL_COMMUNITY): Payer: Self-pay

## 2021-01-02 ENCOUNTER — Other Ambulatory Visit (HOSPITAL_COMMUNITY): Payer: Self-pay

## 2021-01-02 DIAGNOSIS — N390 Urinary tract infection, site not specified: Secondary | ICD-10-CM | POA: Diagnosis not present

## 2021-01-02 DIAGNOSIS — R350 Frequency of micturition: Secondary | ICD-10-CM | POA: Diagnosis not present

## 2021-01-02 MED ORDER — CEPHALEXIN 250 MG PO CAPS
250.0000 mg | ORAL_CAPSULE | Freq: Every day | ORAL | 3 refills | Status: DC
  Filled 2021-01-02: qty 90, 90d supply, fill #0
  Filled 2021-04-27: qty 90, 90d supply, fill #1
  Filled 2021-08-20: qty 90, 90d supply, fill #2
  Filled 2021-11-28: qty 90, 90d supply, fill #3

## 2021-01-02 MED ORDER — OXYCODONE-ACETAMINOPHEN 10-325 MG PO TABS
ORAL_TABLET | ORAL | 0 refills | Status: DC
  Filled 2021-01-02: qty 90, 30d supply, fill #0

## 2021-01-03 ENCOUNTER — Other Ambulatory Visit (HOSPITAL_COMMUNITY): Payer: Self-pay

## 2021-01-09 ENCOUNTER — Other Ambulatory Visit (HOSPITAL_COMMUNITY): Payer: Self-pay

## 2021-01-09 DIAGNOSIS — Z79899 Other long term (current) drug therapy: Secondary | ICD-10-CM | POA: Diagnosis not present

## 2021-01-09 DIAGNOSIS — G603 Idiopathic progressive neuropathy: Secondary | ICD-10-CM | POA: Diagnosis not present

## 2021-01-09 DIAGNOSIS — M545 Low back pain, unspecified: Secondary | ICD-10-CM | POA: Diagnosis not present

## 2021-01-09 DIAGNOSIS — G43009 Migraine without aura, not intractable, without status migrainosus: Secondary | ICD-10-CM | POA: Diagnosis not present

## 2021-01-09 DIAGNOSIS — M5412 Radiculopathy, cervical region: Secondary | ICD-10-CM | POA: Diagnosis not present

## 2021-01-09 MED FILL — Oxybutynin Chloride Tab ER 24HR 10 MG: ORAL | 90 days supply | Qty: 90 | Fill #1 | Status: AC

## 2021-01-16 DIAGNOSIS — Z9889 Other specified postprocedural states: Secondary | ICD-10-CM | POA: Diagnosis not present

## 2021-01-16 DIAGNOSIS — M1732 Unilateral post-traumatic osteoarthritis, left knee: Secondary | ICD-10-CM | POA: Diagnosis not present

## 2021-01-17 ENCOUNTER — Telehealth (HOSPITAL_BASED_OUTPATIENT_CLINIC_OR_DEPARTMENT_OTHER): Payer: Self-pay | Admitting: *Deleted

## 2021-01-17 NOTE — Telephone Encounter (Signed)
   [facility] Pre-operative Risk Assessment    Patient Name: Samantha Molina  DOB: 06-Jan-1961 MRN: [id number]  HEARTCARE STAFF:  - IMPORTANT!!!!!! Under Visit Info/Reason for Call, type in Other and utilize the format Clearance MM/DD/YY or Clearance TBD. Do not use dashes or single digits. - Please review there is not already an duplicate clearance open for this procedure. - If request is for dental extraction, please clarify the # of teeth to be extracted. - If the patient is currently at the dentist's office, call Pre-Op Callback Staff (MA/nurse) to input urgent request.  - If the patient is not currently in the dentist office, please route to the Pre-Op pool.  Request for surgical clearance:  What type of surgery is being performed? L TKR  When is this surgery scheduled? TBD  What type of clearance is required (medical clearance vs. Pharmacy clearance to hold med vs. Both)? Medical  Are there any medications that need to be held prior to surgery and how long? None  Practice name and name of physician performing surgery? [facility] PA-C  What is the office phone number? [phone]   7.   What is the office fax number?           [phone]  8.   Anesthesia type (None, local, MAC, general) ? Georgiann Hahn 01/17/2021, 10:37 AM  _________________________________________________________________   (provider comments below)

## 2021-01-21 ENCOUNTER — Other Ambulatory Visit (HOSPITAL_COMMUNITY): Payer: Self-pay

## 2021-01-21 MED ORDER — ATORVASTATIN CALCIUM 10 MG PO TABS
10.0000 mg | ORAL_TABLET | Freq: Every day | ORAL | 0 refills | Status: DC
  Filled 2021-01-21: qty 90, 90d supply, fill #0

## 2021-01-21 NOTE — Telephone Encounter (Signed)
Left VM

## 2021-01-21 NOTE — Telephone Encounter (Signed)
   Name: Samantha Molina  DOB: 1960-08-15  MRN: [id number]   Primary Cardiologist: Jodelle Red, MD  Chart reviewed as part of pre-operative protocol coverage. Patient was contacted 01/21/2021 in reference to pre-operative risk assessment for pending surgery as outlined below.  Samantha Molina was last seen on 08/07/20 by Dr. Cristal Deer.  Since that day, Samantha Molina has done well.  She is primarily limited by knee pain, but can complete 4.0 METS. She has had a reassuring cardiac workup with recent cardiac CTA in 2020.   Therefore, based on ACC/AHA guidelines, the patient would be at acceptable risk for the planned procedure without further cardiovascular testing.   The patient was advised that if she develops new symptoms prior to surgery to contact our office to arrange for a follow-up visit, and she verbalized understanding.  I will route this recommendation to the requesting party via Epic fax function and remove from pre-op pool. Please call with questions.  Marcelino Duster, PA 01/21/2021, 12:35 PM

## 2021-01-21 NOTE — Telephone Encounter (Signed)
Pt called back in returning call to Pre op  Best number [phone]

## 2021-01-22 ENCOUNTER — Other Ambulatory Visit (HOSPITAL_COMMUNITY): Payer: Self-pay

## 2021-01-30 ENCOUNTER — Other Ambulatory Visit (HOSPITAL_COMMUNITY): Payer: Self-pay

## 2021-01-30 MED ORDER — OXYCODONE-ACETAMINOPHEN 10-325 MG PO TABS
ORAL_TABLET | ORAL | 0 refills | Status: DC
  Filled 2021-02-03: qty 90, 30d supply, fill #0

## 2021-02-01 ENCOUNTER — Other Ambulatory Visit (HOSPITAL_COMMUNITY): Payer: Self-pay

## 2021-02-03 ENCOUNTER — Other Ambulatory Visit (HOSPITAL_COMMUNITY): Payer: Self-pay

## 2021-02-04 DIAGNOSIS — Z01818 Encounter for other preprocedural examination: Secondary | ICD-10-CM | POA: Diagnosis not present

## 2021-02-17 ENCOUNTER — Ambulatory Visit (INDEPENDENT_AMBULATORY_CARE_PROVIDER_SITE_OTHER): Payer: 59 | Admitting: Psychology

## 2021-02-17 DIAGNOSIS — F331 Major depressive disorder, recurrent, moderate: Secondary | ICD-10-CM

## 2021-02-18 ENCOUNTER — Other Ambulatory Visit (HOSPITAL_COMMUNITY): Payer: Self-pay

## 2021-02-19 ENCOUNTER — Other Ambulatory Visit (HOSPITAL_COMMUNITY): Payer: Self-pay

## 2021-02-20 ENCOUNTER — Other Ambulatory Visit (HOSPITAL_COMMUNITY): Payer: Self-pay

## 2021-02-20 MED ORDER — AMLODIPINE BESYLATE 10 MG PO TABS
10.0000 mg | ORAL_TABLET | Freq: Every day | ORAL | 1 refills | Status: DC
  Filled 2021-02-20: qty 90, 90d supply, fill #0

## 2021-02-26 ENCOUNTER — Other Ambulatory Visit (HOSPITAL_COMMUNITY): Payer: Self-pay

## 2021-03-03 ENCOUNTER — Other Ambulatory Visit (HOSPITAL_COMMUNITY): Payer: Self-pay

## 2021-03-03 DIAGNOSIS — J0100 Acute maxillary sinusitis, unspecified: Secondary | ICD-10-CM | POA: Diagnosis not present

## 2021-03-03 MED ORDER — OXYCODONE-ACETAMINOPHEN 10-325 MG PO TABS
ORAL_TABLET | ORAL | 0 refills | Status: DC
  Filled 2021-03-03: qty 90, 30d supply, fill #0

## 2021-03-03 MED ORDER — AMOXICILLIN-POT CLAVULANATE 875-125 MG PO TABS
1.0000 | ORAL_TABLET | Freq: Two times a day (BID) | ORAL | 0 refills | Status: DC
  Filled 2021-03-03: qty 20, 10d supply, fill #0

## 2021-03-06 ENCOUNTER — Ambulatory Visit (INDEPENDENT_AMBULATORY_CARE_PROVIDER_SITE_OTHER): Payer: 59 | Admitting: Psychology

## 2021-03-06 DIAGNOSIS — F331 Major depressive disorder, recurrent, moderate: Secondary | ICD-10-CM | POA: Diagnosis not present
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
## 2021-03-10 ENCOUNTER — Other Ambulatory Visit (HOSPITAL_COMMUNITY): Payer: Self-pay

## 2021-03-10 DIAGNOSIS — M1712 Unilateral primary osteoarthritis, left knee: Secondary | ICD-10-CM | POA: Diagnosis not present

## 2021-03-10 DIAGNOSIS — M21162 Varus deformity, not elsewhere classified, left knee: Secondary | ICD-10-CM | POA: Diagnosis not present

## 2021-03-10 DIAGNOSIS — Z96652 Presence of left artificial knee joint: Secondary | ICD-10-CM | POA: Diagnosis not present

## 2021-03-10 DIAGNOSIS — G8918 Other acute postprocedural pain: Secondary | ICD-10-CM | POA: Diagnosis not present

## 2021-03-10 MED ORDER — ASPIRIN EC 325 MG PO TBEC
325.0000 mg | DELAYED_RELEASE_TABLET | Freq: Two times a day (BID) | ORAL | 0 refills | Status: DC
  Filled 2021-03-10: qty 30, 15d supply, fill #0

## 2021-03-10 MED ORDER — METHOCARBAMOL 500 MG PO TABS
500.0000 mg | ORAL_TABLET | Freq: Four times a day (QID) | ORAL | 0 refills | Status: DC
  Filled 2021-03-10: qty 60, 8d supply, fill #0

## 2021-03-10 MED ORDER — OXYCODONE HCL 5 MG PO TABS
5.0000 mg | ORAL_TABLET | Freq: Four times a day (QID) | ORAL | 0 refills | Status: DC | PRN
  Filled 2021-03-10: qty 42, 6d supply, fill #0

## 2021-03-18 DIAGNOSIS — Z7409 Other reduced mobility: Secondary | ICD-10-CM | POA: Diagnosis not present

## 2021-03-18 DIAGNOSIS — M25462 Effusion, left knee: Secondary | ICD-10-CM | POA: Diagnosis not present

## 2021-03-18 DIAGNOSIS — M25662 Stiffness of left knee, not elsewhere classified: Secondary | ICD-10-CM | POA: Diagnosis not present

## 2021-03-18 DIAGNOSIS — M25562 Pain in left knee: Secondary | ICD-10-CM | POA: Diagnosis not present

## 2021-03-18 DIAGNOSIS — Z96652 Presence of left artificial knee joint: Secondary | ICD-10-CM | POA: Diagnosis not present

## 2021-03-20 ENCOUNTER — Other Ambulatory Visit (HOSPITAL_COMMUNITY): Payer: Self-pay

## 2021-03-20 DIAGNOSIS — M25662 Stiffness of left knee, not elsewhere classified: Secondary | ICD-10-CM | POA: Diagnosis not present

## 2021-03-20 DIAGNOSIS — F32A Depression, unspecified: Secondary | ICD-10-CM | POA: Diagnosis not present

## 2021-03-20 DIAGNOSIS — Z96652 Presence of left artificial knee joint: Secondary | ICD-10-CM | POA: Diagnosis not present

## 2021-03-20 DIAGNOSIS — M25562 Pain in left knee: Secondary | ICD-10-CM | POA: Diagnosis not present

## 2021-03-20 DIAGNOSIS — R079 Chest pain, unspecified: Secondary | ICD-10-CM | POA: Diagnosis not present

## 2021-03-20 DIAGNOSIS — F320 Major depressive disorder, single episode, mild: Secondary | ICD-10-CM | POA: Diagnosis present

## 2021-03-20 DIAGNOSIS — Z7409 Other reduced mobility: Secondary | ICD-10-CM | POA: Diagnosis not present

## 2021-03-20 DIAGNOSIS — G43109 Migraine with aura, not intractable, without status migrainosus: Secondary | ICD-10-CM | POA: Diagnosis present

## 2021-03-20 DIAGNOSIS — E1169 Type 2 diabetes mellitus with other specified complication: Secondary | ICD-10-CM | POA: Diagnosis present

## 2021-03-20 DIAGNOSIS — M25462 Effusion, left knee: Secondary | ICD-10-CM | POA: Diagnosis not present

## 2021-03-20 DIAGNOSIS — R0789 Other chest pain: Secondary | ICD-10-CM | POA: Diagnosis not present

## 2021-03-20 DIAGNOSIS — G4700 Insomnia, unspecified: Secondary | ICD-10-CM | POA: Insufficient documentation

## 2021-03-20 DIAGNOSIS — R457 State of emotional shock and stress, unspecified: Secondary | ICD-10-CM | POA: Diagnosis not present

## 2021-03-20 DIAGNOSIS — F419 Anxiety disorder, unspecified: Secondary | ICD-10-CM | POA: Diagnosis present

## 2021-03-20 DIAGNOSIS — I700 Atherosclerosis of aorta: Secondary | ICD-10-CM | POA: Insufficient documentation

## 2021-03-21 ENCOUNTER — Other Ambulatory Visit (HOSPITAL_COMMUNITY): Payer: Self-pay

## 2021-03-23 DIAGNOSIS — M1712 Unilateral primary osteoarthritis, left knee: Secondary | ICD-10-CM | POA: Diagnosis not present

## 2021-03-23 DIAGNOSIS — Z96652 Presence of left artificial knee joint: Secondary | ICD-10-CM | POA: Diagnosis not present

## 2021-03-25 ENCOUNTER — Other Ambulatory Visit (HOSPITAL_COMMUNITY): Payer: Self-pay

## 2021-03-25 DIAGNOSIS — Z96652 Presence of left artificial knee joint: Secondary | ICD-10-CM | POA: Diagnosis not present

## 2021-03-25 DIAGNOSIS — M25462 Effusion, left knee: Secondary | ICD-10-CM | POA: Diagnosis not present

## 2021-03-25 DIAGNOSIS — Z7409 Other reduced mobility: Secondary | ICD-10-CM | POA: Diagnosis not present

## 2021-03-25 DIAGNOSIS — M25562 Pain in left knee: Secondary | ICD-10-CM | POA: Diagnosis not present

## 2021-03-25 DIAGNOSIS — M25662 Stiffness of left knee, not elsewhere classified: Secondary | ICD-10-CM | POA: Diagnosis not present

## 2021-03-25 MED ORDER — METHOCARBAMOL 500 MG PO TABS
500.0000 mg | ORAL_TABLET | Freq: Four times a day (QID) | ORAL | 0 refills | Status: DC
  Filled 2021-03-25: qty 60, 8d supply, fill #0

## 2021-03-27 DIAGNOSIS — M25562 Pain in left knee: Secondary | ICD-10-CM | POA: Diagnosis not present

## 2021-03-27 DIAGNOSIS — Z7409 Other reduced mobility: Secondary | ICD-10-CM | POA: Diagnosis not present

## 2021-03-27 DIAGNOSIS — Z96652 Presence of left artificial knee joint: Secondary | ICD-10-CM | POA: Diagnosis not present

## 2021-03-27 DIAGNOSIS — M25662 Stiffness of left knee, not elsewhere classified: Secondary | ICD-10-CM | POA: Diagnosis not present

## 2021-03-27 DIAGNOSIS — M25462 Effusion, left knee: Secondary | ICD-10-CM | POA: Diagnosis not present

## 2021-03-31 ENCOUNTER — Other Ambulatory Visit (HOSPITAL_COMMUNITY): Payer: Self-pay

## 2021-03-31 MED ORDER — OXYCODONE-ACETAMINOPHEN 10-325 MG PO TABS
ORAL_TABLET | ORAL | 0 refills | Status: DC
  Filled 2021-04-01: qty 90, 30d supply, fill #0

## 2021-04-01 ENCOUNTER — Other Ambulatory Visit: Payer: Self-pay | Admitting: Family Medicine

## 2021-04-01 ENCOUNTER — Other Ambulatory Visit (HOSPITAL_COMMUNITY): Payer: Self-pay

## 2021-04-01 DIAGNOSIS — Z1231 Encounter for screening mammogram for malignant neoplasm of breast: Secondary | ICD-10-CM

## 2021-04-01 DIAGNOSIS — M25662 Stiffness of left knee, not elsewhere classified: Secondary | ICD-10-CM | POA: Diagnosis not present

## 2021-04-01 DIAGNOSIS — Z96652 Presence of left artificial knee joint: Secondary | ICD-10-CM | POA: Diagnosis not present

## 2021-04-01 DIAGNOSIS — M25562 Pain in left knee: Secondary | ICD-10-CM | POA: Diagnosis not present

## 2021-04-01 DIAGNOSIS — Z7409 Other reduced mobility: Secondary | ICD-10-CM | POA: Diagnosis not present

## 2021-04-01 DIAGNOSIS — M25462 Effusion, left knee: Secondary | ICD-10-CM | POA: Diagnosis not present

## 2021-04-03 DIAGNOSIS — M25662 Stiffness of left knee, not elsewhere classified: Secondary | ICD-10-CM | POA: Diagnosis not present

## 2021-04-03 DIAGNOSIS — Z96652 Presence of left artificial knee joint: Secondary | ICD-10-CM | POA: Diagnosis not present

## 2021-04-03 DIAGNOSIS — M25562 Pain in left knee: Secondary | ICD-10-CM | POA: Diagnosis not present

## 2021-04-03 DIAGNOSIS — M25462 Effusion, left knee: Secondary | ICD-10-CM | POA: Diagnosis not present

## 2021-04-03 DIAGNOSIS — Z7409 Other reduced mobility: Secondary | ICD-10-CM | POA: Diagnosis not present

## 2021-04-08 DIAGNOSIS — Z7409 Other reduced mobility: Secondary | ICD-10-CM | POA: Diagnosis not present

## 2021-04-08 DIAGNOSIS — M25662 Stiffness of left knee, not elsewhere classified: Secondary | ICD-10-CM | POA: Diagnosis not present

## 2021-04-08 DIAGNOSIS — Z96652 Presence of left artificial knee joint: Secondary | ICD-10-CM | POA: Diagnosis not present

## 2021-04-08 DIAGNOSIS — M25562 Pain in left knee: Secondary | ICD-10-CM | POA: Diagnosis not present

## 2021-04-08 DIAGNOSIS — M25462 Effusion, left knee: Secondary | ICD-10-CM | POA: Diagnosis not present

## 2021-04-10 ENCOUNTER — Ambulatory Visit (INDEPENDENT_AMBULATORY_CARE_PROVIDER_SITE_OTHER): Payer: 59 | Admitting: Psychology

## 2021-04-10 DIAGNOSIS — M25662 Stiffness of left knee, not elsewhere classified: Secondary | ICD-10-CM | POA: Diagnosis not present

## 2021-04-10 DIAGNOSIS — Z7409 Other reduced mobility: Secondary | ICD-10-CM | POA: Diagnosis not present

## 2021-04-10 DIAGNOSIS — Z96652 Presence of left artificial knee joint: Secondary | ICD-10-CM | POA: Diagnosis not present

## 2021-04-10 DIAGNOSIS — F331 Major depressive disorder, recurrent, moderate: Secondary | ICD-10-CM | POA: Diagnosis not present

## 2021-04-10 DIAGNOSIS — M25562 Pain in left knee: Secondary | ICD-10-CM | POA: Diagnosis not present

## 2021-04-10 DIAGNOSIS — M25462 Effusion, left knee: Secondary | ICD-10-CM | POA: Diagnosis not present
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
## 2021-04-15 DIAGNOSIS — M25562 Pain in left knee: Secondary | ICD-10-CM | POA: Diagnosis not present

## 2021-04-15 DIAGNOSIS — Z96652 Presence of left artificial knee joint: Secondary | ICD-10-CM | POA: Diagnosis not present

## 2021-04-15 DIAGNOSIS — M25662 Stiffness of left knee, not elsewhere classified: Secondary | ICD-10-CM | POA: Diagnosis not present

## 2021-04-15 DIAGNOSIS — Z7409 Other reduced mobility: Secondary | ICD-10-CM | POA: Diagnosis not present

## 2021-04-15 DIAGNOSIS — M25462 Effusion, left knee: Secondary | ICD-10-CM | POA: Diagnosis not present

## 2021-04-17 DIAGNOSIS — M25662 Stiffness of left knee, not elsewhere classified: Secondary | ICD-10-CM | POA: Diagnosis not present

## 2021-04-17 DIAGNOSIS — M25562 Pain in left knee: Secondary | ICD-10-CM | POA: Diagnosis not present

## 2021-04-17 DIAGNOSIS — Z7409 Other reduced mobility: Secondary | ICD-10-CM | POA: Diagnosis not present

## 2021-04-17 DIAGNOSIS — Z96652 Presence of left artificial knee joint: Secondary | ICD-10-CM | POA: Diagnosis not present

## 2021-04-17 DIAGNOSIS — M25462 Effusion, left knee: Secondary | ICD-10-CM | POA: Diagnosis not present

## 2021-04-22 ENCOUNTER — Other Ambulatory Visit (HOSPITAL_COMMUNITY): Payer: Self-pay

## 2021-04-22 DIAGNOSIS — Z7409 Other reduced mobility: Secondary | ICD-10-CM | POA: Diagnosis not present

## 2021-04-22 DIAGNOSIS — M25562 Pain in left knee: Secondary | ICD-10-CM | POA: Diagnosis not present

## 2021-04-22 DIAGNOSIS — M25462 Effusion, left knee: Secondary | ICD-10-CM | POA: Diagnosis not present

## 2021-04-22 DIAGNOSIS — M25662 Stiffness of left knee, not elsewhere classified: Secondary | ICD-10-CM | POA: Diagnosis not present

## 2021-04-22 DIAGNOSIS — Z96652 Presence of left artificial knee joint: Secondary | ICD-10-CM | POA: Diagnosis not present

## 2021-04-23 ENCOUNTER — Other Ambulatory Visit (HOSPITAL_COMMUNITY): Payer: Self-pay

## 2021-04-23 MED ORDER — ATORVASTATIN CALCIUM 10 MG PO TABS
10.0000 mg | ORAL_TABLET | Freq: Every day | ORAL | 0 refills | Status: DC
  Filled 2021-04-23: qty 90, 90d supply, fill #0

## 2021-04-24 ENCOUNTER — Ambulatory Visit (INDEPENDENT_AMBULATORY_CARE_PROVIDER_SITE_OTHER): Payer: 59 | Admitting: Psychology

## 2021-04-24 DIAGNOSIS — Z96652 Presence of left artificial knee joint: Secondary | ICD-10-CM | POA: Diagnosis not present

## 2021-04-24 DIAGNOSIS — M25562 Pain in left knee: Secondary | ICD-10-CM | POA: Diagnosis not present

## 2021-04-24 DIAGNOSIS — F331 Major depressive disorder, recurrent, moderate: Secondary | ICD-10-CM

## 2021-04-24 DIAGNOSIS — M25462 Effusion, left knee: Secondary | ICD-10-CM | POA: Diagnosis not present

## 2021-04-24 DIAGNOSIS — Z7409 Other reduced mobility: Secondary | ICD-10-CM | POA: Diagnosis not present

## 2021-04-24 DIAGNOSIS — G8929 Other chronic pain: Secondary | ICD-10-CM | POA: Diagnosis not present

## 2021-04-24 DIAGNOSIS — R29898 Other symptoms and signs involving the musculoskeletal system: Secondary | ICD-10-CM | POA: Diagnosis not present

## 2021-04-24 DIAGNOSIS — M25662 Stiffness of left knee, not elsewhere classified: Secondary | ICD-10-CM | POA: Diagnosis not present

## 2021-04-24 NOTE — Progress Notes (Signed)
[facility] Counselor/Therapist Progress Note  Patient ID: Samantha Molina, MRN: [id number],    Date: 04/24/2021  Time Spent: 10:00am-10:50am  50 minutes   Treatment Type: Individual Therapy  Reported Symptoms: sadness, stress  Mental Status Exam: Appearance:  Casual     Behavior: Appropriate  Motor: Normal  Speech/Language:  Normal Rate  Affect: Appropriate  Mood: normal  Thought process: normal  Thought content:   WNL  Sensory/Perceptual disturbances:   WNL  Orientation: oriented to person, place, time/date, and situation  Attention: Good  Concentration: Good  Memory: WNL  Fund of knowledge:  Good  Insight:   Good  Judgment:  Good  Impulse Control: Good   Risk Assessment: Danger to Self:  No Self-injurious Behavior: No Danger to Others: No Duty to Warn:no Physical Aggression / Violence:No  Access to Firearms a concern: No  Gang Involvement:No   Subjective: Pt Samantha Molina present for face-to-face individual therapy via video Webex.  Pt consents to telehealth video session due to COVID 19 pandemic. Location of pt: home Location of therapist: home office.  Pt talked about her recuperation from her knee replacement surgery.   Pt is going to PT and is doing the exercises at home as well.  Pt is progressing in a way that her providers are happy with.   Pt talked about feeling frustrated that she has to rely on people for help.  She does not like to rely on others.   Pt talked about her relationship with her husband Samantha Molina.   He has been rude to some of her friends which has made pt upset.  Samantha Molina is going to therapy and working on anger management.   Worked with pt on processing feelings and relationship dynamics. Pt talked about her mood and feeling down at times.  Pt was tearful as she talked about her sons having health issues.  One of pt's son's is having trouble with his eyesight.   Her other son has high blood pressure and chest pains.  Pt feels helpless that  she can not do anything for her sons other than pray for them.  Addressed pt's worries.  Worked on how she can release the worries.  She identified prayer as a tool for her.    Pt has been coloring, reading the Bible and watching tv while she is recuperating.  Pt is also writing in her journal a lot.  Pt's friends and family have also been visiting her frequently.  Pt is doing a good job practicing self care.  She does meditation each day.  Pt goes back to work March 7th. Provided supportive therapy.    Interventions: Cognitive Behavioral Therapy and Insight-Oriented  Diagnosis: F33.1  Plan: See pt's Treatment Plan for depression in Therapy Charts.  (Treatment Plan Target Date: 02/17/2022) Pt is progressing toward treatment goals.   Plan to continue to see pt every two weeks.    Cayce Paschal, LCSW

## 2021-04-28 ENCOUNTER — Other Ambulatory Visit (HOSPITAL_COMMUNITY): Payer: Self-pay

## 2021-04-29 DIAGNOSIS — R29898 Other symptoms and signs involving the musculoskeletal system: Secondary | ICD-10-CM | POA: Diagnosis not present

## 2021-04-29 DIAGNOSIS — Z96652 Presence of left artificial knee joint: Secondary | ICD-10-CM | POA: Diagnosis not present

## 2021-04-29 DIAGNOSIS — M25562 Pain in left knee: Secondary | ICD-10-CM | POA: Diagnosis not present

## 2021-04-29 DIAGNOSIS — Z7409 Other reduced mobility: Secondary | ICD-10-CM | POA: Diagnosis not present

## 2021-04-29 DIAGNOSIS — M25662 Stiffness of left knee, not elsewhere classified: Secondary | ICD-10-CM | POA: Diagnosis not present

## 2021-04-29 DIAGNOSIS — M25462 Effusion, left knee: Secondary | ICD-10-CM | POA: Diagnosis not present

## 2021-04-30 ENCOUNTER — Other Ambulatory Visit (HOSPITAL_COMMUNITY): Payer: Self-pay

## 2021-04-30 MED ORDER — OXYCODONE-ACETAMINOPHEN 10-325 MG PO TABS
ORAL_TABLET | ORAL | 0 refills | Status: DC
  Filled 2021-04-30: qty 48, 16d supply, fill #0

## 2021-05-01 DIAGNOSIS — Z96652 Presence of left artificial knee joint: Secondary | ICD-10-CM | POA: Diagnosis not present

## 2021-05-01 DIAGNOSIS — Z7409 Other reduced mobility: Secondary | ICD-10-CM | POA: Diagnosis not present

## 2021-05-01 DIAGNOSIS — M25462 Effusion, left knee: Secondary | ICD-10-CM | POA: Diagnosis not present

## 2021-05-01 DIAGNOSIS — M25662 Stiffness of left knee, not elsewhere classified: Secondary | ICD-10-CM | POA: Diagnosis not present

## 2021-05-01 DIAGNOSIS — M25562 Pain in left knee: Secondary | ICD-10-CM | POA: Diagnosis not present

## 2021-05-01 DIAGNOSIS — R29898 Other symptoms and signs involving the musculoskeletal system: Secondary | ICD-10-CM | POA: Diagnosis not present

## 2021-05-05 ENCOUNTER — Other Ambulatory Visit (HOSPITAL_COMMUNITY): Payer: Self-pay

## 2021-05-06 ENCOUNTER — Other Ambulatory Visit (HOSPITAL_COMMUNITY): Payer: Self-pay

## 2021-05-06 DIAGNOSIS — M25562 Pain in left knee: Secondary | ICD-10-CM | POA: Diagnosis not present

## 2021-05-06 DIAGNOSIS — Z7409 Other reduced mobility: Secondary | ICD-10-CM | POA: Diagnosis not present

## 2021-05-06 DIAGNOSIS — Z96652 Presence of left artificial knee joint: Secondary | ICD-10-CM | POA: Diagnosis not present

## 2021-05-06 DIAGNOSIS — R29898 Other symptoms and signs involving the musculoskeletal system: Secondary | ICD-10-CM | POA: Diagnosis not present

## 2021-05-06 DIAGNOSIS — M25662 Stiffness of left knee, not elsewhere classified: Secondary | ICD-10-CM | POA: Diagnosis not present

## 2021-05-06 DIAGNOSIS — M25462 Effusion, left knee: Secondary | ICD-10-CM | POA: Diagnosis not present

## 2021-05-08 ENCOUNTER — Ambulatory Visit (INDEPENDENT_AMBULATORY_CARE_PROVIDER_SITE_OTHER): Payer: 59 | Admitting: Psychology

## 2021-05-08 ENCOUNTER — Other Ambulatory Visit (HOSPITAL_COMMUNITY): Payer: Self-pay

## 2021-05-08 DIAGNOSIS — R29898 Other symptoms and signs involving the musculoskeletal system: Secondary | ICD-10-CM | POA: Diagnosis not present

## 2021-05-08 DIAGNOSIS — M25662 Stiffness of left knee, not elsewhere classified: Secondary | ICD-10-CM | POA: Diagnosis not present

## 2021-05-08 DIAGNOSIS — F331 Major depressive disorder, recurrent, moderate: Secondary | ICD-10-CM | POA: Diagnosis not present

## 2021-05-08 DIAGNOSIS — M25562 Pain in left knee: Secondary | ICD-10-CM | POA: Diagnosis not present

## 2021-05-08 DIAGNOSIS — Z96652 Presence of left artificial knee joint: Secondary | ICD-10-CM | POA: Diagnosis not present

## 2021-05-08 DIAGNOSIS — Z7409 Other reduced mobility: Secondary | ICD-10-CM | POA: Diagnosis not present

## 2021-05-08 DIAGNOSIS — M25462 Effusion, left knee: Secondary | ICD-10-CM | POA: Diagnosis not present

## 2021-05-08 NOTE — Progress Notes (Signed)
[facility] Counselor/Therapist Progress Note  Patient ID: Samantha Molina, MRN: [id number],    Date: 05/08/2021  Time Spent: 9:00am-9:50am  50 minutes   Treatment Type: Individual Therapy  Reported Symptoms: sadness, stress  Mental Status Exam: Appearance:  Casual     Behavior: Appropriate  Motor: Normal  Speech/Language:  Normal Rate  Affect: Appropriate  Mood: normal  Thought process: normal  Thought content:   WNL  Sensory/Perceptual disturbances:   WNL  Orientation: oriented to person, place, time/date, and situation  Attention: Good  Concentration: Good  Memory: WNL  Fund of knowledge:  Good  Insight:   Good  Judgment:  Good  Impulse Control: Good   Risk Assessment: Danger to Self:  No Self-injurious Behavior: No Danger to Others: No Duty to Warn:no Physical Aggression / Violence:No  Access to Firearms a concern: No  Gang Involvement:No   Subjective: Pt Samantha Molina present for face-to-face individual therapy via video Webex.  Pt consents to telehealth video session due to COVID 19 pandemic. Location of pt: home Location of therapist: home office.  Pt talked about her recuperation from her knee replacement surgery.   Pt is going to PT and they are working her hard.  She is able to walk without a cane now.   Pt is progressing in a way that her providers are happy with.  Pt can start driving again this weekend.   Pt talked about how her sons are doing with their health issues.   One son will be having eye surgery.  The other son will have doctors appointments for more tests.  Addressed pt's concerns about her sons.  She feels less worried about them bc they are getting the medical care they need.  Pt is also using her prayer box as a tool to release worries.   Pt talked about her relationship with her husband Samantha Molina.   He did a good job acknowledging pt for Valentine's Day.   Samantha Molina is going to therapy and working on anger management.   Pt is noticing some  improvement in Jerry's behavior.  Worked with pt on processing feelings and relationship dynamics.  Pt has been coloring, reading the Bible and watching tv while she is recuperating.  Pt is also writing in her journal a lot.  Pt's friends and family have also been visiting her frequently.  Pt is doing a good job practicing self care.  She does meditation each day.  Pt goes back to work March 7th. Addressed how pt can continue self care when she returns to work and re-engages in her normal life.   Pt is 61 and hopes to go to part time work and work 3 days a week at age 64 and then retire at age 81.  Provided supportive therapy.    Interventions: Cognitive Behavioral Therapy and Insight-Oriented  Diagnosis: F33.1  Plan: See pt's Treatment Plan for depression in Therapy Charts.  (Treatment Plan Target Date: 02/17/2022) Pt is progressing toward treatment goals.   Plan to continue to see pt every two weeks.    Jamelle Noy, LCSW

## 2021-05-12 ENCOUNTER — Other Ambulatory Visit (HOSPITAL_COMMUNITY): Payer: Self-pay

## 2021-05-13 DIAGNOSIS — M25462 Effusion, left knee: Secondary | ICD-10-CM | POA: Diagnosis not present

## 2021-05-13 DIAGNOSIS — M25662 Stiffness of left knee, not elsewhere classified: Secondary | ICD-10-CM | POA: Diagnosis not present

## 2021-05-13 DIAGNOSIS — Z7409 Other reduced mobility: Secondary | ICD-10-CM | POA: Diagnosis not present

## 2021-05-13 DIAGNOSIS — M25562 Pain in left knee: Secondary | ICD-10-CM | POA: Diagnosis not present

## 2021-05-13 DIAGNOSIS — Z96652 Presence of left artificial knee joint: Secondary | ICD-10-CM | POA: Diagnosis not present

## 2021-05-14 ENCOUNTER — Other Ambulatory Visit (HOSPITAL_COMMUNITY): Payer: Self-pay

## 2021-05-15 ENCOUNTER — Other Ambulatory Visit (HOSPITAL_COMMUNITY): Payer: Self-pay

## 2021-05-15 DIAGNOSIS — Z79899 Other long term (current) drug therapy: Secondary | ICD-10-CM | POA: Diagnosis not present

## 2021-05-15 DIAGNOSIS — M542 Cervicalgia: Secondary | ICD-10-CM | POA: Diagnosis not present

## 2021-05-15 DIAGNOSIS — F419 Anxiety disorder, unspecified: Secondary | ICD-10-CM | POA: Diagnosis not present

## 2021-05-15 DIAGNOSIS — M5417 Radiculopathy, lumbosacral region: Secondary | ICD-10-CM | POA: Diagnosis not present

## 2021-05-15 DIAGNOSIS — G43009 Migraine without aura, not intractable, without status migrainosus: Secondary | ICD-10-CM | POA: Diagnosis not present

## 2021-05-15 MED ORDER — OXYCODONE-ACETAMINOPHEN 10-325 MG PO TABS
ORAL_TABLET | ORAL | 0 refills | Status: DC
  Filled 2021-05-15: qty 90, 30d supply, fill #0

## 2021-05-16 ENCOUNTER — Other Ambulatory Visit (HOSPITAL_COMMUNITY): Payer: Self-pay

## 2021-05-19 ENCOUNTER — Other Ambulatory Visit (HOSPITAL_COMMUNITY): Payer: Self-pay

## 2021-05-19 DIAGNOSIS — Z96652 Presence of left artificial knee joint: Secondary | ICD-10-CM | POA: Diagnosis not present

## 2021-05-19 DIAGNOSIS — F411 Generalized anxiety disorder: Secondary | ICD-10-CM | POA: Diagnosis not present

## 2021-05-19 DIAGNOSIS — E1169 Type 2 diabetes mellitus with other specified complication: Secondary | ICD-10-CM | POA: Diagnosis not present

## 2021-05-19 DIAGNOSIS — Z7409 Other reduced mobility: Secondary | ICD-10-CM | POA: Diagnosis not present

## 2021-05-19 DIAGNOSIS — I10 Essential (primary) hypertension: Secondary | ICD-10-CM | POA: Diagnosis not present

## 2021-05-19 DIAGNOSIS — R29898 Other symptoms and signs involving the musculoskeletal system: Secondary | ICD-10-CM | POA: Diagnosis not present

## 2021-05-19 DIAGNOSIS — M25562 Pain in left knee: Secondary | ICD-10-CM | POA: Diagnosis not present

## 2021-05-19 DIAGNOSIS — Z23 Encounter for immunization: Secondary | ICD-10-CM | POA: Diagnosis not present

## 2021-05-19 DIAGNOSIS — M25462 Effusion, left knee: Secondary | ICD-10-CM | POA: Diagnosis not present

## 2021-05-19 DIAGNOSIS — M25662 Stiffness of left knee, not elsewhere classified: Secondary | ICD-10-CM | POA: Diagnosis not present

## 2021-05-19 DIAGNOSIS — G4700 Insomnia, unspecified: Secondary | ICD-10-CM | POA: Diagnosis not present

## 2021-05-19 DIAGNOSIS — I700 Atherosclerosis of aorta: Secondary | ICD-10-CM | POA: Diagnosis not present

## 2021-05-19 DIAGNOSIS — F320 Major depressive disorder, single episode, mild: Secondary | ICD-10-CM | POA: Diagnosis not present

## 2021-05-19 MED ORDER — TOPIRAMATE 100 MG PO TABS
100.0000 mg | ORAL_TABLET | Freq: Two times a day (BID) | ORAL | 4 refills | Status: DC
  Filled 2021-05-19: qty 60, 30d supply, fill #0
  Filled 2021-06-23: qty 60, 30d supply, fill #1
  Filled 2021-07-21: qty 60, 30d supply, fill #2
  Filled 2021-08-25: qty 60, 30d supply, fill #3
  Filled 2021-09-26: qty 60, 30d supply, fill #4

## 2021-05-20 ENCOUNTER — Other Ambulatory Visit (HOSPITAL_COMMUNITY): Payer: Self-pay

## 2021-05-20 MED ORDER — ALPRAZOLAM 0.25 MG PO TABS
0.1250 mg | ORAL_TABLET | Freq: Every day | ORAL | 1 refills | Status: DC | PRN
  Filled 2021-05-20: qty 30, 30d supply, fill #0
  Filled 2021-07-20: qty 30, 30d supply, fill #1

## 2021-05-20 MED ORDER — LISINOPRIL-HYDROCHLOROTHIAZIDE 20-25 MG PO TABS
1.0000 | ORAL_TABLET | Freq: Every day | ORAL | 1 refills | Status: DC
  Filled 2021-05-20 – 2021-06-09 (×2): qty 90, 90d supply, fill #0

## 2021-05-20 MED ORDER — AMLODIPINE BESYLATE 10 MG PO TABS
10.0000 mg | ORAL_TABLET | Freq: Every day | ORAL | 1 refills | Status: DC
  Filled 2021-05-20: qty 90, 90d supply, fill #0
  Filled 2021-08-20: qty 90, 90d supply, fill #1

## 2021-05-22 ENCOUNTER — Ambulatory Visit
Admission: RE | Admit: 2021-05-22 | Discharge: 2021-05-22 | Disposition: A | Discharge status: Home / Self Care | Payer: 59 | Source: Ambulatory Visit | Attending: Family Medicine | Admitting: Family Medicine

## 2021-05-22 ENCOUNTER — Other Ambulatory Visit (HOSPITAL_COMMUNITY): Payer: Self-pay

## 2021-05-22 DIAGNOSIS — R29898 Other symptoms and signs involving the musculoskeletal system: Secondary | ICD-10-CM | POA: Diagnosis not present

## 2021-05-22 DIAGNOSIS — Z7409 Other reduced mobility: Secondary | ICD-10-CM | POA: Diagnosis not present

## 2021-05-22 DIAGNOSIS — M25562 Pain in left knee: Secondary | ICD-10-CM | POA: Diagnosis not present

## 2021-05-22 DIAGNOSIS — M25662 Stiffness of left knee, not elsewhere classified: Secondary | ICD-10-CM | POA: Diagnosis not present

## 2021-05-22 DIAGNOSIS — M25462 Effusion, left knee: Secondary | ICD-10-CM | POA: Diagnosis not present

## 2021-05-22 DIAGNOSIS — Z96652 Presence of left artificial knee joint: Secondary | ICD-10-CM | POA: Diagnosis not present

## 2021-05-22 DIAGNOSIS — Z1231 Encounter for screening mammogram for malignant neoplasm of breast: Secondary | ICD-10-CM | POA: Diagnosis not present

## 2021-05-22 MED FILL — Oxybutynin Chloride Tab ER 24HR 10 MG: ORAL | 90 days supply | Qty: 90 | Fill #2 | Status: AC

## 2021-05-23 ENCOUNTER — Other Ambulatory Visit (HOSPITAL_COMMUNITY): Payer: Self-pay

## 2021-05-23 MED ORDER — CLINDAMYCIN HCL 300 MG PO CAPS
600.0000 mg | ORAL_CAPSULE | ORAL | 3 refills | Status: DC
  Filled 2021-05-23: qty 8, 4d supply, fill #0

## 2021-05-26 ENCOUNTER — Ambulatory Visit (INDEPENDENT_AMBULATORY_CARE_PROVIDER_SITE_OTHER): Payer: 59 | Admitting: Psychology

## 2021-05-26 DIAGNOSIS — F331 Major depressive disorder, recurrent, moderate: Secondary | ICD-10-CM
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
## 2021-05-30 NOTE — Progress Notes (Deleted)
61 y.o. [id number] Married Black or [place] Not Hispanic or Latino female here for annual exam.   ?  ? ?No LMP recorded. Patient has had a hysterectomy.          ?Sexually active: {yes no:314532}  ?The current method of family planning is {contraception:315051}.    ?Exercising: {yes no:314532}  {types:19826} ?Smoker:  no ? ?Health Maintenance: ?Pap:  ?  HYST ?History of abnormal Pap:  no ?MMG:  05-22-21 normal ?BMD:   Never ?Colonoscopy: 11-15-20 normal ?TDaP:  UTD ?Gardasil: N/A ? ? reports that she has never smoked. She has never used smokeless tobacco. She reports current alcohol use of about 1.0 standard drink per week. She reports that she does not use drugs. ? ?Past Medical History:  ?Diagnosis Date  ? Arthritis   ? Arthritis   ? both knees  ? Chronic leg pain   ? Coronary artery disease   ? Depression   ? Diabetes mellitus without complication (HCC)   ? Heart murmur   ? as a child  ? Hypertension   ? Migraine without aura   ? Morbid obesity (HCC)   ? Urinary incontinence   ? Vitamin D deficiency disease 2013  ? ? ?Past Surgical History:  ?Procedure Laterality Date  ? KNEE ARTHROSCOPY W/ ACL RECONSTRUCTION Left 2004  ? LAPAROSCOPIC CHOLECYSTECTOMY  2005  ? LAPAROSCOPIC GASTRIC BANDING  2007  ? TONSILLECTOMY  1984  ? Age 61  ? TOTAL VAGINAL HYSTERECTOMY  age 61  ? Ovaries remain, for menorrhagia  ? TUBAL LIGATION    ? ? ?Current Outpatient Medications  ?Medication Sig Dispense Refill  ? ALPRAZolam (XANAX) 0.25 MG tablet Take 0.5-1 tablets (0.125-0.25 mg total) by mouth daily as needed. 30 tablet 2  ? ALPRAZolam (XANAX) 0.25 MG tablet Take 0.5-1 tablets (0.125-0.25 mg total) by mouth daily as needed. 30 tablet 1  ? amLODipine (NORVASC) 10 MG tablet TAKE 1 TABLET BY MOUTH ONCE DAILY 90 tablet 1  ? amLODipine (NORVASC) 10 MG tablet Take 1 tablet (10 mg total) by mouth daily. 90 tablet 1  ? amLODipine (NORVASC) 10 MG tablet Take 1 tablet (10 mg total) by mouth daily. 90 tablet 1  ? amoxicillin-clavulanate  (AUGMENTIN) 875-125 MG tablet Take 1 tablet by mouth every 12 (twelve) hours for 10 days 20 tablet 0  ? aspirin EC 325 MG tablet Take 1 tablet (325 mg total) by mouth 2 (two) times daily. 30 tablet 0  ? atorvastatin (LIPITOR) 10 MG tablet Take 1 tablet (10 mg total) by mouth daily. 90 tablet 0  ? cephALEXin (KEFLEX) 250 MG capsule Take 250 mg by mouth at bedtime. (Patient not taking: No sig reported)  5  ? cephALEXin (KEFLEX) 250 MG capsule Take 1 capsule (250 mg total) by mouth daily. 90 capsule 3  ? Cholecalciferol (VITAMIN D) 2000 UNITS tablet Take 2,000 Units by mouth daily.    ? clindamycin (CLEOCIN) 300 MG capsule Take 2 capsules (600 mg total) by mouth as directed 1 hour before procedure 8 capsule 3  ? desvenlafaxine (PRISTIQ) 50 MG 24 hr tablet Take 50 mg by mouth daily.    ? desvenlafaxine (PRISTIQ) 50 MG 24 hr tablet Take 1 tablet (50 mg total) by mouth daily. 90 tablet 1  ? lisinopril-hydrochlorothiazide (ZESTORETIC) 20-25 MG tablet Take 1 tablet by mouth daily. 90 tablet 1  ? methocarbamol (ROBAXIN) 500 MG tablet Take 1-2 tablets (500-1,000 mg total) by mouth 4 (four) times daily for 10 days 60 tablet  0  ? Multiple Vitamins-Minerals (MULTIVITAMIN WITH MINERALS) tablet Take 1 tablet by mouth daily.    ? oxybutynin (DITROPAN-XL) 10 MG 24 hr tablet TAKE 1 TABLET (10 MG TOTAL) BY MOUTH AT BEDTIME. 90 tablet 3  ? oxyCODONE (OXY IR/ROXICODONE) 5 MG immediate release tablet Take 1-2 tablets (5-10 mg total) by mouth every 6 (six) hours as needed for up to 5 days 42 tablet 0  ? oxyCODONE-acetaminophen (PERCOCET) 10-325 MG tablet Take 1 tablet by mouth every 8 hours as needed 90 tablet 0  ? oxyCODONE-acetaminophen (PERCOCET) 10-325 MG tablet Take 1 tablet by mouth every 8 hours as needed for 30 days 90 tablet 0  ? oxyCODONE-acetaminophen (PERCOCET) 10-325 MG tablet Take 1 tablet by mouth every 8 hours as needed 90 tablet 0  ? oxyCODONE-acetaminophen (PERCOCET) 10-325 MG tablet Take 1 tablet by mouth every 8 hours  as needed for 30 days 90 tablet 0  ? topiramate (TOPAMAX) 100 MG tablet Take 1 tablet (100 mg total) by mouth 2 (two) times daily. 60 tablet 4  ? traZODone (DESYREL) 50 MG tablet Take 50 mg by mouth at bedtime as needed for sleep. (Patient not taking: No sig reported)    ? ?No current facility-administered medications for this visit.  ? ? ?Family History  ?Problem Relation Age of Onset  ? Hypertension Mother   ? Rheum arthritis Mother   ?     Lupus  ? Heart disease Mother   ? Lupus Mother   ? Diabetes Father   ? Hypertension Father   ? Stroke Father   ? Hyperlipidemia Father   ? Heart disease Father   ? Lupus Brother   ? Diabetes Brother   ? Hyperlipidemia Brother   ? Breast cancer Maternal Aunt 27  ?     X 2, one in her 50's  ? Lupus Maternal Aunt   ? Breast cancer Paternal Aunt 30  ?     died in her 77's  ? Lymphoma Son   ? Breast cancer Cousin 36  ?     maternal & paternal cousin  ? Colon cancer Neg Hx   ? Colon polyps Neg Hx   ? Esophageal cancer Neg Hx   ? Stomach cancer Neg Hx   ? Rectal cancer Neg Hx   ? ? ?Review of Systems ? ?Exam:   ?There were no vitals taken for this visit.  Weight change: @WEIGHTCHANGE @ Height:      ?Ht Readings from Last 3 Encounters:  ?11/15/20 5\' 5"  (1.651 m)  ?10/16/20 5\' 5"  (1.651 m)  ?08/07/20 5\' 5"  (1.651 m)  ? ? ?General appearance: alert, cooperative and appears stated age ?Head: Normocephalic, without obvious abnormality, atraumatic ?Neck: no adenopathy, supple, symmetrical, trachea midline and thyroid {CHL AMB PHY EX THYROID NORM DEFAULT:[phone]::"normal to inspection and palpation"} ?Lungs: clear to auscultation bilaterally ?Cardiovascular: regular rate and rhythm ?Breasts: {Exam; breast:13139::"normal appearance, no masses or tenderness"} ?Abdomen: soft, non-tender; non distended,  no masses,  no organomegaly ?Extremities: extremities normal, atraumatic, no cyanosis or edema ?Skin: Skin color, texture, turgor normal. No rashes or lesions ?Lymph nodes: Cervical,  supraclavicular, and axillary nodes normal. ?No abnormal inguinal nodes palpated ?Neurologic: Grossly normal ? ? ?Pelvic: External genitalia:  no lesions ?             Urethra:  normal appearing urethra with no masses, tenderness or lesions ?             Bartholins and Skenes: normal    ?  Vagina: normal appearing vagina with normal color and discharge, no lesions ?             Cervix: {CHL AMB PHY EX CERVIX NORM [phone]::"no lesions"} ?              ?Bimanual Exam:  Uterus:  {CHL AMB PHY EX UTERUS NORM DEFAULT:[phone]::"normal size, contour, position, consistency, mobility, non-tender"} ?             Adnexa: {CHL AMB PHY EX ADNEXA NO MASS [phone]::"no mass, fullness, tenderness"} ?              Rectovaginal: Confirms ?              Anus:  normal sphincter tone, no lesions ? ?*** chaperoned for the exam. ? ?A:  Well Woman with normal exam ? ?P:    ? ?  ?

## 2021-06-04 ENCOUNTER — Ambulatory Visit: Payer: 59 | Admitting: Obstetrics and Gynecology

## 2021-06-09 ENCOUNTER — Other Ambulatory Visit (HOSPITAL_COMMUNITY): Payer: Self-pay

## 2021-06-09 MED ORDER — OXYCODONE-ACETAMINOPHEN 10-325 MG PO TABS
ORAL_TABLET | ORAL | 0 refills | Status: DC
  Filled 2021-06-12: qty 90, 30d supply, fill #0

## 2021-06-10 ENCOUNTER — Other Ambulatory Visit (HOSPITAL_COMMUNITY): Payer: Self-pay

## 2021-06-10 MED ORDER — DESVENLAFAXINE SUCCINATE ER 50 MG PO TB24
50.0000 mg | ORAL_TABLET | Freq: Every day | ORAL | 1 refills | Status: DC
  Filled 2021-06-10: qty 90, 90d supply, fill #0

## 2021-06-12 ENCOUNTER — Other Ambulatory Visit (HOSPITAL_COMMUNITY): Payer: Self-pay

## 2021-06-13 ENCOUNTER — Ambulatory Visit: Payer: 59 | Admitting: Obstetrics and Gynecology

## 2021-06-19 ENCOUNTER — Ambulatory Visit (HOSPITAL_BASED_OUTPATIENT_CLINIC_OR_DEPARTMENT_OTHER): Payer: 59 | Admitting: Cardiology

## 2021-06-19 ENCOUNTER — Ambulatory Visit: Payer: 59 | Admitting: Psychology

## 2021-06-19 DIAGNOSIS — Z7409 Other reduced mobility: Secondary | ICD-10-CM | POA: Diagnosis not present

## 2021-06-19 DIAGNOSIS — M25562 Pain in left knee: Secondary | ICD-10-CM | POA: Diagnosis not present

## 2021-06-19 DIAGNOSIS — M25662 Stiffness of left knee, not elsewhere classified: Secondary | ICD-10-CM | POA: Diagnosis not present

## 2021-06-19 DIAGNOSIS — R29898 Other symptoms and signs involving the musculoskeletal system: Secondary | ICD-10-CM | POA: Diagnosis not present

## 2021-06-19 DIAGNOSIS — Z96652 Presence of left artificial knee joint: Secondary | ICD-10-CM | POA: Diagnosis not present

## 2021-06-19 DIAGNOSIS — M25462 Effusion, left knee: Secondary | ICD-10-CM | POA: Diagnosis not present

## 2021-06-20 ENCOUNTER — Ambulatory Visit (HOSPITAL_BASED_OUTPATIENT_CLINIC_OR_DEPARTMENT_OTHER): Payer: 59 | Admitting: Cardiology

## 2021-06-23 ENCOUNTER — Other Ambulatory Visit (HOSPITAL_COMMUNITY): Payer: Self-pay

## 2021-06-24 DIAGNOSIS — Z7409 Other reduced mobility: Secondary | ICD-10-CM | POA: Diagnosis not present

## 2021-06-24 DIAGNOSIS — R29898 Other symptoms and signs involving the musculoskeletal system: Secondary | ICD-10-CM | POA: Diagnosis not present

## 2021-06-24 DIAGNOSIS — M25662 Stiffness of left knee, not elsewhere classified: Secondary | ICD-10-CM | POA: Diagnosis not present

## 2021-06-24 DIAGNOSIS — M25462 Effusion, left knee: Secondary | ICD-10-CM | POA: Diagnosis not present

## 2021-06-24 DIAGNOSIS — Z96652 Presence of left artificial knee joint: Secondary | ICD-10-CM | POA: Diagnosis not present

## 2021-06-24 DIAGNOSIS — M25562 Pain in left knee: Secondary | ICD-10-CM | POA: Diagnosis not present

## 2021-07-08 ENCOUNTER — Ambulatory Visit (INDEPENDENT_AMBULATORY_CARE_PROVIDER_SITE_OTHER): Payer: 59 | Admitting: Psychology

## 2021-07-08 DIAGNOSIS — F331 Major depressive disorder, recurrent, moderate: Secondary | ICD-10-CM | POA: Diagnosis not present

## 2021-07-08 NOTE — Progress Notes (Signed)
[facility] Counselor/Therapist Progress Note ? ?Patient ID: Samantha Molina, MRN: [id number],   ? ?Date: 07/08/2021 ? ?Time Spent: 10:00am-10:45am  45 minutes  ? ?Treatment Type: Individual Therapy ? ?Reported Symptoms: sadness, stress ? ?Mental Status Exam: ?Appearance:  Casual     ?Behavior: Appropriate  ?Motor: Normal  ?Speech/Language:  Normal Rate  ?Affect: Appropriate  ?Mood: normal  ?Thought process: normal  ?Thought content:   WNL  ?Sensory/Perceptual disturbances:   WNL  ?Orientation: oriented to person, place, time/date, and situation  ?Attention: Good  ?Concentration: Good  ?Memory: WNL  ?Fund of knowledge:  Good  ?Insight:   Good  ?Judgment:  Good  ?Impulse Control: Good  ? ?Risk Assessment: ?Danger to Self:  No ?Self-injurious Behavior: No ?Danger to Others: No ?Duty to Warn:no ?Physical Aggression / Violence:No  ?Access to Firearms a concern: No  ?Gang Involvement:No  ? ?Subjective: Pt Samantha Molina present for face-to-face individual therapy via video Webex.  Pt consents to telehealth video session due to COVID 19 pandemic. ?Location of pt: home ?Location of therapist: home office.  ?Pt talked about being back to work.  She states that it has been ok.   There have been a lot of changes though that pt is not happy with.  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  Pt asked her director about changing to part time when she is 61 years old.  Her director was not as supportive as pt hoped.  Pt was upset and may decide to just retire at 53.  Helped pt process her feelings and work dynamics.   ?Pt talked about her leg that she had a knee replacement on.   Pt states one leg is longer than her other leg.  Pt is limping.  Pt states it is depressing for her to be struggling with her leg.  She has had to get a foot insert so that she won't limp and have hip pain.   ?Pt talked about a trip to see family in [place] with her mother.  She states she was thought her sister  was going to be with them but she did not go. Pt states it was stressful dealing with her mother and family.  Helped pt process her feelings and relationship dynamics.   Pt's mother favors pt's brother's children which is hurtful to pt.    ?Addressed how pt can continue self care when she returns to work and re-engages in her normal life.    ?Provided supportive therapy.   ? ?Interventions: Cognitive Behavioral Therapy and Insight-Oriented ? ?Diagnosis: F33.1 ? ?Plan: See pt's Treatment Plan for depression in Therapy Charts.  (Treatment Plan Target Date: 02/17/2022) ?Pt is progressing toward treatment goals.   ?Plan to continue to see pt every two weeks.   ? ?Shantaya Bluestone, LCSW ? ? ? ?

## 2021-07-10 ENCOUNTER — Other Ambulatory Visit (HOSPITAL_COMMUNITY): Payer: Self-pay

## 2021-07-10 DIAGNOSIS — M5417 Radiculopathy, lumbosacral region: Secondary | ICD-10-CM | POA: Diagnosis not present

## 2021-07-10 DIAGNOSIS — Z79899 Other long term (current) drug therapy: Secondary | ICD-10-CM | POA: Diagnosis not present

## 2021-07-10 DIAGNOSIS — G603 Idiopathic progressive neuropathy: Secondary | ICD-10-CM | POA: Diagnosis not present

## 2021-07-10 DIAGNOSIS — G43009 Migraine without aura, not intractable, without status migrainosus: Secondary | ICD-10-CM | POA: Diagnosis not present

## 2021-07-10 DIAGNOSIS — F419 Anxiety disorder, unspecified: Secondary | ICD-10-CM | POA: Diagnosis not present

## 2021-07-10 DIAGNOSIS — M5412 Radiculopathy, cervical region: Secondary | ICD-10-CM | POA: Diagnosis not present

## 2021-07-10 MED ORDER — OXYCODONE-ACETAMINOPHEN 10-325 MG PO TABS
ORAL_TABLET | ORAL | 0 refills | Status: DC
  Filled 2021-07-10: qty 90, 30d supply, fill #0

## 2021-07-11 ENCOUNTER — Other Ambulatory Visit (HOSPITAL_COMMUNITY): Payer: Self-pay

## 2021-07-15 DIAGNOSIS — M25562 Pain in left knee: Secondary | ICD-10-CM | POA: Diagnosis not present

## 2021-07-15 DIAGNOSIS — Z7409 Other reduced mobility: Secondary | ICD-10-CM | POA: Diagnosis not present

## 2021-07-15 DIAGNOSIS — M25662 Stiffness of left knee, not elsewhere classified: Secondary | ICD-10-CM | POA: Diagnosis not present

## 2021-07-15 DIAGNOSIS — R29898 Other symptoms and signs involving the musculoskeletal system: Secondary | ICD-10-CM | POA: Diagnosis not present

## 2021-07-15 DIAGNOSIS — M25462 Effusion, left knee: Secondary | ICD-10-CM | POA: Diagnosis not present

## 2021-07-15 DIAGNOSIS — Z96652 Presence of left artificial knee joint: Secondary | ICD-10-CM | POA: Diagnosis not present

## 2021-07-18 ENCOUNTER — Ambulatory Visit (INDEPENDENT_AMBULATORY_CARE_PROVIDER_SITE_OTHER): Payer: 59 | Admitting: Podiatry

## 2021-07-18 ENCOUNTER — Encounter: Payer: Self-pay | Admitting: Podiatry

## 2021-07-18 ENCOUNTER — Ambulatory Visit: Payer: 59

## 2021-07-18 DIAGNOSIS — M779 Enthesopathy, unspecified: Secondary | ICD-10-CM

## 2021-07-18 DIAGNOSIS — M2170 Unequal limb length (acquired), unspecified site: Secondary | ICD-10-CM

## 2021-07-18 NOTE — Progress Notes (Signed)
SITUATION ?Reason for Consult: Evaluation for Bilateral Custom Foot Orthoses ?Patient / Caregiver Report: Patient is ready for foot orthotics ? ?OBJECTIVE DATA: ?Patient History / Diagnosis:  ?  ICD-10-CM   ?1. Tendonitis  M77.9   ?  ?2. Acquired unequal limb length  M21.70   ?  ? ? ?Current or Previous Devices:   None and no history ? ?Foot Examination: ?Skin presentation:   Intact ?Ulcers & Callousing:   None ?Toe / Foot Deformities:  1/2" leg length discrepancy Lt ?Weight Bearing Presentation:  Rectus ?Sensation:    Intact ? ?Shoe Size:    26M ? ?ORTHOTIC RECOMMENDATION ?Recommended Device: 1x pair of custom functional foot orthotics ? ?GOALS OF ORTHOSES ?- Reduce Pain ?- Prevent Foot Deformity ?- Prevent Progression of Further Foot Deformity ?- Relieve Pressure ?- Improve the Overall Biomechanical Function of the Foot and Lower Extremity. ? ?ACTIONS PERFORMED ?Potential out of pocket cost was communicated to patient. Patient understood and consent to casting. Patient was casted for Foot Orthoses via crush box. Procedure was explained and patient tolerated procedure well. Casts were shipped to central fabrication. All questions were answered and concerns addressed. ? ?PLAN ?Patient is to be called for fitting when devices are ready.  ? ? ?

## 2021-07-18 NOTE — Progress Notes (Signed)
Subjective:  ? ?Patient ID: Samantha Molina, female   DOB: 61 y.o.   MRN: [id number]  ? ?HPI ?Patient presents stating she had a knee replacement in her left leg is now at least a half an inch longer than her right and she does get foot and hip pain secondary to change in gait pattern.  States knee replacement is doing well but this has been a problem for her.  Patient does not smoke likes to be active and is 4 months post knee replacement ? ? ?Review of Systems  ?All other systems reviewed and are negative. ? ? ?   ?Objective:  ?Physical Exam ?Vitals and nursing note reviewed.  ?Constitutional:   ?   Appearance: She is well-developed.  ?Pulmonary:  ?   Effort: Pulmonary effort is normal.  ?Musculoskeletal:     ?   General: Normal range of motion.  ?Skin: ?   General: Skin is warm.  ?Neurological:  ?   Mental Status: She is alert.  ?  ?Neurovascular status intact muscle strength found to be adequate range of motion within normal limits.  Patient does have significant limb length discrepancy with right leg 1/2 inch shorter than the left and does have mild to moderate inflammation noted and discomfort secondary to changes that have occurred.  Patient does work weightbearing job ? ?   ?Assessment:  ?Tendinitis-like symptomatology with patient having had knee replacement creating significant limb length discrepancy ? ?   ?Plan:  ?H&P reviewed condition.  At this point we are going to cast for functional orthotics we will add 3 days of an inch to the right side for balance and hopefully this will be all she needs with possible external lift that could be added later.  Patient tolerated this well and was done by pedorthist and will be seen back when orthotics are returned ?   ? ? ?

## 2021-07-21 ENCOUNTER — Other Ambulatory Visit (HOSPITAL_COMMUNITY): Payer: Self-pay

## 2021-07-22 ENCOUNTER — Other Ambulatory Visit (HOSPITAL_COMMUNITY): Payer: Self-pay

## 2021-07-22 ENCOUNTER — Ambulatory Visit (HOSPITAL_BASED_OUTPATIENT_CLINIC_OR_DEPARTMENT_OTHER): Payer: 59 | Admitting: Cardiology

## 2021-07-23 ENCOUNTER — Other Ambulatory Visit (HOSPITAL_COMMUNITY): Payer: Self-pay

## 2021-07-23 MED ORDER — ATORVASTATIN CALCIUM 10 MG PO TABS
10.0000 mg | ORAL_TABLET | Freq: Every day | ORAL | 0 refills | Status: DC
  Filled 2021-07-23: qty 90, 90d supply, fill #0

## 2021-07-30 ENCOUNTER — Ambulatory Visit: Payer: 59 | Admitting: Psychology

## 2021-08-05 ENCOUNTER — Other Ambulatory Visit (HOSPITAL_COMMUNITY): Payer: Self-pay

## 2021-08-05 MED ORDER — OXYCODONE-ACETAMINOPHEN 10-325 MG PO TABS
ORAL_TABLET | ORAL | 0 refills | Status: DC
  Filled 2021-08-08: qty 90, 30d supply, fill #0

## 2021-08-08 ENCOUNTER — Other Ambulatory Visit (HOSPITAL_COMMUNITY): Payer: Self-pay

## 2021-08-08 ENCOUNTER — Telehealth: Payer: Self-pay

## 2021-08-08 NOTE — Telephone Encounter (Signed)
Left message on voicemail to set up appt to pick up orthotics

## 2021-08-11 ENCOUNTER — Other Ambulatory Visit (HOSPITAL_COMMUNITY): Payer: Self-pay

## 2021-08-12 NOTE — Progress Notes (Signed)
61 y.o. [id number] Married Black or [place] American Not Hispanic or Latino female here for annual exam.  H/O TVH. Not sexually active secondary to ED.    Patient wants refill on Oxybutynin. H/O OAB, helped with medication.  She had a left total knee replacement in 12/22, now that leg is longer than her right leg. Now her hip is hurting. She is getting orthotics.   No LMP recorded. Patient has had a hysterectomy.          Sexually active: No.  The current method of family planning is status post hysterectomy.    Exercising: No.   Some zumba--had left total knee replacement 02/2021 Smoker:  no  Health Maintenance: Pap:   Unsure, h/o TVH History of abnormal Pap:  no MMG:  05/22/21 Bi-rads 1 neg  BMD:   none Colonoscopy: 11/15/20 f/u/10 years  TDaP:  02/20/15  Gardasil: n/a   reports that she has never smoked. She has never used smokeless tobacco. She reports that she does not currently use alcohol. She reports that she does not use drugs. Occasional ETOH. 2 sons, 12 grandchildren, one great grandchild. 2 grand kids have sickle cell. Works as a Diplomatic Services operational officer at [employer].  Husband has had a kidney transplant, doing okay.   Past Medical History:  Diagnosis Date   Arthritis    Arthritis    both knees   Chronic leg pain    Coronary artery disease    Depression    Diabetes mellitus without complication (HCC)    Heart murmur    as a child   Hypertension    Migraine without aura    Morbid obesity (HCC)    Urinary incontinence    Vitamin D deficiency disease 2013    Past Surgical History:  Procedure Laterality Date   JOINT REPLACEMENT Left 2022   Knee   KNEE ARTHROSCOPY W/ ACL RECONSTRUCTION Left 2004   LAPAROSCOPIC CHOLECYSTECTOMY  2005   LAPAROSCOPIC GASTRIC BANDING  2007   TONSILLECTOMY  1984   Age 75   TOTAL VAGINAL HYSTERECTOMY  age 74   Ovaries remain, for menorrhagia   TUBAL LIGATION      Current Outpatient Medications  Medication Sig Dispense Refill   acetaminophen  (TYLENOL) 650 MG CR tablet Take 650 mg by mouth every 8 (eight) hours as needed for pain.     ALPRAZolam (XANAX) 0.25 MG tablet Take 0.5-1 tablets (0.125-0.25 mg total) by mouth daily as needed. (Patient taking differently: Take 0.125-0.25 mg by mouth daily as needed for anxiety.) 30 tablet 2   amLODipine (NORVASC) 10 MG tablet Take 1 tablet (10 mg total) by mouth daily. 90 tablet 1   aspirin 81 MG chewable tablet Chew 1 tablet (81 mg total) by mouth daily. 30 tablet 1   atorvastatin (LIPITOR) 10 MG tablet Take 1 tablet (10 mg total) by mouth daily. 90 tablet 0   cephALEXin (KEFLEX) 250 MG capsule Take 1 capsule (250 mg total) by mouth daily. 90 capsule 3   Cholecalciferol (VITAMIN D) 2000 UNITS tablet Take 2,000 Units by mouth daily.     desvenlafaxine (PRISTIQ) 50 MG 24 hr tablet Take 1 tablet (50 mg total) by mouth daily. 90 tablet 1   isosorbide mononitrate (IMDUR) 30 MG 24 hr tablet Take 1 tablet (30 mg total) by mouth daily. 30 tablet 1   lisinopril-hydrochlorothiazide (ZESTORETIC) 20-25 MG tablet Take 1 tablet by mouth daily. 90 tablet 1   metoprolol tartrate (LOPRESSOR) 100 MG tablet Take 1 tablet (100  mg total) by mouth once for 1 dose. Please take one time dose 100mg  metoprolol tartrate 2 hr prior to cardiac CT for HR control IF HR >55bpm. 1 tablet 0   Multiple Vitamins-Minerals (MULTIVITAMIN WITH MINERALS) tablet Take 1 tablet by mouth daily.     oxyCODONE-acetaminophen (PERCOCET) 10-325 MG tablet Take 1 tablet by mouth every 8 hours as needed (Patient taking differently: Take 1 tablet by mouth every 8 (eight) hours as needed for pain.) 90 tablet 0   topiramate (TOPAMAX) 100 MG tablet Take 1 tablet (100 mg total) by mouth 2 (two) times daily. 60 tablet 4   No current facility-administered medications for this visit.    Family History  Problem Relation Age of Onset   Hypertension Mother    Rheum arthritis Mother        Lupus   Heart disease Mother    Lupus Mother    Diabetes Father     Hypertension Father    Stroke Father    Hyperlipidemia Father    Heart disease Father    Lupus Brother    Diabetes Brother    Hyperlipidemia Brother    Breast cancer Maternal Aunt 62       X 2, one in her 12's   Lupus Maternal Aunt    Breast cancer Paternal Aunt 30       died in her 11's   Lymphoma Son    Breast cancer Cousin 19       maternal & paternal cousin   Colon cancer Neg Hx    Colon polyps Neg Hx    Esophageal cancer Neg Hx    Stomach cancer Neg Hx    Rectal cancer Neg Hx     Review of Systems  All other systems reviewed and are negative.  Exam:   BP 118/74   Pulse 97   Ht 5' 3.5" (1.613 m)   Wt 187 lb (84.8 kg)   SpO2 99%   BMI 32.61 kg/m   Weight change: @WEIGHTCHANGE @ Height:   Height: 5' 3.5" (161.3 cm)  Ht Readings from Last 3 Encounters:  08/21/21 5' 3.5" (1.613 m)  08/16/21 5\' 6"  (1.676 m)  11/15/20 5\' 5"  (1.651 m)    General appearance: alert, cooperative and appears stated age Head: Normocephalic, without obvious abnormality, atraumatic Neck: no adenopathy, supple, symmetrical, trachea midline and thyroid normal to inspection and palpation Lungs: clear to auscultation bilaterally Cardiovascular: regular rate and rhythm Breasts: normal appearance, no masses or tenderness Abdomen: soft, non-tender; non distended,  no masses,  no organomegaly Extremities: extremities normal, atraumatic, no cyanosis or edema Skin: Skin color, texture, turgor normal. No rashes or lesions Lymph nodes: Cervical, supraclavicular, and axillary nodes normal. No abnormal inguinal nodes palpated Neurologic: Grossly normal   Pelvic: External genitalia:  no lesions              Urethra:  normal appearing urethra with no masses, tenderness or lesions              Bartholins and Skenes: normal                 Vagina: normal appearing vagina with normal color and discharge, no lesions              Cervix: absent               Bimanual Exam:  Uterus:  uterus absent               Adnexa: no  mass, fullness, tenderness               Rectovaginal: Confirms               Anus:  normal sphincter tone, no lesions  Claudette LawsAmanda Dixon, CMA chaperoned for the exam.  1. Well woman exam Discussed breast self exam Discussed calcium and vit D intake Mammogram and colonoscopy are UTD Labs are UTD  2. OAB (overactive bladder) Doing well on ditropan - oxybutynin (DITROPAN-XL) 10 MG 24 hr tablet; Take 1 tablet (10 mg total) by mouth at bedtime.  Dispense: 90 tablet; Refill: 3

## 2021-08-14 ENCOUNTER — Ambulatory Visit: Payer: 59

## 2021-08-14 DIAGNOSIS — M2170 Unequal limb length (acquired), unspecified site: Secondary | ICD-10-CM

## 2021-08-14 DIAGNOSIS — G43009 Migraine without aura, not intractable, without status migrainosus: Secondary | ICD-10-CM | POA: Diagnosis not present

## 2021-08-14 DIAGNOSIS — M5417 Radiculopathy, lumbosacral region: Secondary | ICD-10-CM | POA: Diagnosis not present

## 2021-08-14 DIAGNOSIS — F419 Anxiety disorder, unspecified: Secondary | ICD-10-CM | POA: Diagnosis not present

## 2021-08-14 DIAGNOSIS — M5412 Radiculopathy, cervical region: Secondary | ICD-10-CM | POA: Diagnosis not present

## 2021-08-14 DIAGNOSIS — M779 Enthesopathy, unspecified: Secondary | ICD-10-CM

## 2021-08-14 DIAGNOSIS — Z79899 Other long term (current) drug therapy: Secondary | ICD-10-CM | POA: Diagnosis not present

## 2021-08-14 NOTE — Progress Notes (Signed)
Attempted to fit patient with foot orthotics -- devices were made too short and heel lift is on the left when it should be on the right. Sending back for adjustments-RUSH. Patient will be called when the devices are ready.

## 2021-08-15 ENCOUNTER — Ambulatory Visit: Admitting: Psychology

## 2021-08-16 ENCOUNTER — Observation Stay (HOSPITAL_BASED_OUTPATIENT_CLINIC_OR_DEPARTMENT_OTHER)
Admission: EM | Admit: 2021-08-16 | Discharge: 2021-08-17 | Disposition: A | Discharge status: Home / Self Care | Payer: 59 | Attending: Family Medicine | Admitting: Family Medicine

## 2021-08-16 ENCOUNTER — Encounter (HOSPITAL_BASED_OUTPATIENT_CLINIC_OR_DEPARTMENT_OTHER): Payer: Self-pay

## 2021-08-16 ENCOUNTER — Emergency Department (HOSPITAL_BASED_OUTPATIENT_CLINIC_OR_DEPARTMENT_OTHER): Payer: 59 | Admitting: Radiology

## 2021-08-16 ENCOUNTER — Observation Stay (HOSPITAL_BASED_OUTPATIENT_CLINIC_OR_DEPARTMENT_OTHER): Payer: 59

## 2021-08-16 ENCOUNTER — Other Ambulatory Visit: Payer: Self-pay

## 2021-08-16 DIAGNOSIS — F329 Major depressive disorder, single episode, unspecified: Secondary | ICD-10-CM | POA: Insufficient documentation

## 2021-08-16 DIAGNOSIS — Z96652 Presence of left artificial knee joint: Secondary | ICD-10-CM | POA: Insufficient documentation

## 2021-08-16 DIAGNOSIS — Q210 Ventricular septal defect: Secondary | ICD-10-CM | POA: Diagnosis not present

## 2021-08-16 DIAGNOSIS — G43109 Migraine with aura, not intractable, without status migrainosus: Secondary | ICD-10-CM | POA: Diagnosis not present

## 2021-08-16 DIAGNOSIS — E1169 Type 2 diabetes mellitus with other specified complication: Secondary | ICD-10-CM | POA: Diagnosis present

## 2021-08-16 DIAGNOSIS — E119 Type 2 diabetes mellitus without complications: Secondary | ICD-10-CM | POA: Diagnosis not present

## 2021-08-16 DIAGNOSIS — I2510 Atherosclerotic heart disease of native coronary artery without angina pectoris: Secondary | ICD-10-CM | POA: Diagnosis not present

## 2021-08-16 DIAGNOSIS — F419 Anxiety disorder, unspecified: Secondary | ICD-10-CM | POA: Diagnosis not present

## 2021-08-16 DIAGNOSIS — G8929 Other chronic pain: Secondary | ICD-10-CM

## 2021-08-16 DIAGNOSIS — E876 Hypokalemia: Secondary | ICD-10-CM

## 2021-08-16 DIAGNOSIS — F320 Major depressive disorder, single episode, mild: Secondary | ICD-10-CM | POA: Diagnosis present

## 2021-08-16 DIAGNOSIS — I10 Essential (primary) hypertension: Secondary | ICD-10-CM | POA: Diagnosis not present

## 2021-08-16 DIAGNOSIS — R072 Precordial pain: Secondary | ICD-10-CM | POA: Diagnosis not present

## 2021-08-16 DIAGNOSIS — Z7982 Long term (current) use of aspirin: Secondary | ICD-10-CM | POA: Insufficient documentation

## 2021-08-16 DIAGNOSIS — R0789 Other chest pain: Secondary | ICD-10-CM | POA: Diagnosis not present

## 2021-08-16 DIAGNOSIS — R079 Chest pain, unspecified: Secondary | ICD-10-CM | POA: Diagnosis not present

## 2021-08-16 DIAGNOSIS — Z79899 Other long term (current) drug therapy: Secondary | ICD-10-CM | POA: Diagnosis not present

## 2021-08-16 DIAGNOSIS — I340 Nonrheumatic mitral (valve) insufficiency: Secondary | ICD-10-CM | POA: Diagnosis not present

## 2021-08-16 LAB — ECHOCARDIOGRAM COMPLETE
AR max vel: 1.72 cm2
AV Area VTI: 1.85 cm2
AV Area mean vel: 1.76 cm2
AV Mean grad: 6 mmHg
AV Peak grad: 12 mmHg
Ao pk vel: 1.73 m/s
Area-P 1/2: 2.95 cm2
Height: 66 in
S' Lateral: 2.9 cm
Weight: 2987.2 oz

## 2021-08-16 LAB — CBC WITH DIFFERENTIAL/PLATELET
Abs Immature Granulocytes: 0.06 10*3/uL (ref 0.00–0.07)
Basophils Absolute: 0 10*3/uL (ref 0.0–0.1)
Basophils Relative: 0 %
Eosinophils Absolute: 0 10*3/uL (ref 0.0–0.5)
Eosinophils Relative: 0 %
HCT: 39.3 % (ref 36.0–46.0)
Hemoglobin: 12.2 g/dL (ref 12.0–15.0)
Immature Granulocytes: 1 %
Lymphocytes Relative: 26 %
Lymphs Abs: 2.7 10*3/uL (ref 0.7–4.0)
MCH: 22.8 pg — ABNORMAL LOW (ref 26.0–34.0)
MCHC: 31 g/dL (ref 30.0–36.0)
MCV: 73.5 fL — ABNORMAL LOW (ref 80.0–100.0)
Monocytes Absolute: 0.9 10*3/uL (ref 0.1–1.0)
Monocytes Relative: 9 %
Neutro Abs: 6.7 10*3/uL (ref 1.7–7.7)
Neutrophils Relative %: 64 %
Platelets: 395 10*3/uL (ref 150–400)
RBC: 5.35 MIL/uL — ABNORMAL HIGH (ref 3.87–5.11)
RDW: 16.5 % — ABNORMAL HIGH (ref 11.5–15.5)
WBC: 10.4 10*3/uL (ref 4.0–10.5)
nRBC: 0 % (ref 0.0–0.2)

## 2021-08-16 LAB — VITAMIN B12: Vitamin B-12: 227 pg/mL (ref 180–914)

## 2021-08-16 LAB — TROPONIN I (HIGH SENSITIVITY)
Troponin I (High Sensitivity): 5 ng/L (ref <18)
Troponin I (High Sensitivity): 5 ng/L (ref <18)
Troponin I (High Sensitivity): 4 ng/L (ref <18)

## 2021-08-16 LAB — BASIC METABOLIC PANEL
Anion gap: 9 (ref 5–15)
BUN: 24 mg/dL — ABNORMAL HIGH (ref 8–23)
CO2: 27 mmol/L (ref 22–32)
Calcium: 9.9 mg/dL (ref 8.9–10.3)
Chloride: 104 mmol/L (ref 98–111)
Creatinine, Ser: 0.82 mg/dL (ref 0.44–1.00)
GFR, Estimated: >60 mL/min (ref >60)
Glucose, Bld: 104 mg/dL — ABNORMAL HIGH (ref 70–99)
Potassium: 3.2 mmol/L — ABNORMAL LOW (ref 3.5–5.1)
Sodium: 140 mmol/L (ref 135–145)

## 2021-08-16 LAB — GLUCOSE, CAPILLARY: Glucose-Capillary: 110 mg/dL — ABNORMAL HIGH (ref 70–99)

## 2021-08-16 LAB — MAGNESIUM: Magnesium: 2.2 mg/dL (ref 1.7–2.4)

## 2021-08-16 LAB — HIV ANTIBODY (ROUTINE TESTING W REFLEX): HIV Screen 4th Generation wRfx: NONREACTIVE

## 2021-08-16 MED ORDER — ISOSORBIDE MONONITRATE ER 30 MG PO TB24
30.0000 mg | ORAL_TABLET | Freq: Every day | ORAL | Status: DC
  Administered 2021-08-16 – 2021-08-17 (×2): 30 mg via ORAL
  Filled 2021-08-16 (×2): qty 1

## 2021-08-16 MED ORDER — ONDANSETRON HCL 4 MG/2ML IJ SOLN: 4.0000 mg | Freq: Four times a day (QID) | INTRAMUSCULAR | Status: DC | PRN

## 2021-08-16 MED ORDER — AMLODIPINE BESYLATE 10 MG PO TABS
10.0000 mg | ORAL_TABLET | Freq: Every day | ORAL | Status: DC
  Administered 2021-08-16 – 2021-08-17 (×2): 10 mg via ORAL
  Filled 2021-08-16 (×2): qty 1

## 2021-08-16 MED ORDER — NITROGLYCERIN 0.4 MG SL SUBL
0.4000 mg | SUBLINGUAL_TABLET | SUBLINGUAL | Status: DC | PRN
  Administered 2021-08-16 (×2): 0.4 mg via SUBLINGUAL
  Filled 2021-08-16 (×2): qty 1

## 2021-08-16 MED ORDER — ENOXAPARIN SODIUM 40 MG/0.4ML IJ SOSY
40.0000 mg | PREFILLED_SYRINGE | INTRAMUSCULAR | Status: DC
  Administered 2021-08-16: 40 mg via SUBCUTANEOUS
  Filled 2021-08-16: qty 0.4

## 2021-08-16 MED ORDER — TOPIRAMATE 100 MG PO TABS
100.0000 mg | ORAL_TABLET | Freq: Two times a day (BID) | ORAL | Status: DC
  Administered 2021-08-16 – 2021-08-17 (×3): 100 mg via ORAL
  Filled 2021-08-16 (×4): qty 1

## 2021-08-16 MED ORDER — POTASSIUM CHLORIDE CRYS ER 20 MEQ PO TBCR
40.0000 meq | EXTENDED_RELEASE_TABLET | Freq: Once | ORAL | Status: AC
  Administered 2021-08-16: 40 meq via ORAL
  Filled 2021-08-16: qty 2

## 2021-08-16 MED ORDER — ASPIRIN 81 MG PO CHEW
81.0000 mg | CHEWABLE_TABLET | Freq: Every day | ORAL | Status: DC
  Administered 2021-08-17: 81 mg via ORAL
  Filled 2021-08-16: qty 1

## 2021-08-16 MED ORDER — ATORVASTATIN CALCIUM 10 MG PO TABS
10.0000 mg | ORAL_TABLET | Freq: Every day | ORAL | Status: DC
  Administered 2021-08-16 – 2021-08-17 (×2): 10 mg via ORAL
  Filled 2021-08-16 (×2): qty 1

## 2021-08-16 MED ORDER — ASPIRIN 81 MG PO CHEW
324.0000 mg | CHEWABLE_TABLET | Freq: Once | ORAL | Status: AC
Start: 2021-08-16 — End: 2021-08-16
  Administered 2021-08-16: 324 mg via ORAL
  Filled 2021-08-16: qty 4

## 2021-08-16 MED ORDER — OXYCODONE-ACETAMINOPHEN 5-325 MG PO TABS
2.0000 | ORAL_TABLET | Freq: Three times a day (TID) | ORAL | Status: DC | PRN
  Administered 2021-08-16 – 2021-08-17 (×2): 2 via ORAL
  Filled 2021-08-16 (×2): qty 2

## 2021-08-16 MED ORDER — ACETAMINOPHEN 325 MG PO TABS: 650.0000 mg | ORAL_TABLET | ORAL | Status: DC | PRN

## 2021-08-16 MED ORDER — ALPRAZOLAM 0.25 MG PO TABS: 0.1250 mg | ORAL_TABLET | Freq: Every day | ORAL | Status: DC | PRN

## 2021-08-16 MED ORDER — CEPHALEXIN 250 MG PO CAPS
250.0000 mg | ORAL_CAPSULE | Freq: Every day | ORAL | Status: DC
  Administered 2021-08-16 – 2021-08-17 (×2): 250 mg via ORAL
  Filled 2021-08-16 (×2): qty 1

## 2021-08-16 MED ORDER — OXYBUTYNIN CHLORIDE ER 10 MG PO TB24
10.0000 mg | ORAL_TABLET | Freq: Every day | ORAL | Status: DC
  Administered 2021-08-16: 10 mg via ORAL
  Filled 2021-08-16 (×2): qty 1

## 2021-08-16 MED ORDER — VENLAFAXINE HCL ER 75 MG PO CP24
75.0000 mg | ORAL_CAPSULE | Freq: Every day | ORAL | Status: DC
Start: 2021-08-16 — End: 2021-08-17
  Administered 2021-08-16 – 2021-08-17 (×2): 75 mg via ORAL
  Filled 2021-08-16 (×2): qty 1

## 2021-08-16 MED ORDER — INSULIN ASPART 100 UNIT/ML IJ SOLN: 0.0000 [IU] | Freq: Three times a day (TID) | INTRAMUSCULAR | Status: DC

## 2021-08-16 NOTE — Assessment & Plan Note (Signed)
Very well controlled. A1C of 5.6 in February of 2023 Diet controlled SSI and accuchecks qac/qhs while inpatient

## 2021-08-16 NOTE — Assessment & Plan Note (Deleted)
61 year old female with history of HTN, T2DM, HLD, nonocclusive CAD by CT cardiac in 02/2201 and strong CAD family history presenting with recurrent substernal chest pain  -obs to tele -cardiology consulted -troponin wnl x 2 and ekg without acute changes -chest pain free after NG, no heparin  -echo pending -received 324 mg ASA, start 81mg  ASA daily  -EDP discussed with cardiology who felt patient could come in for [facility], likely next Tuesday.  -lipid panel in AM, continue lipitor 10mg  for now

## 2021-08-16 NOTE — Assessment & Plan Note (Signed)
Continue Topamax.

## 2021-08-16 NOTE — Assessment & Plan Note (Addendum)
Very well controlled to soft readings. Continue norvasc, hold Zestoretic for now

## 2021-08-16 NOTE — ED Triage Notes (Signed)
Pt reports sternal cp radiating to bilateral jaws beginning this morning around 2AM. Pain began while at rest. No worsening with palpation or movements.

## 2021-08-16 NOTE — Assessment & Plan Note (Signed)
repleted with in ED Check magnesium Trend

## 2021-08-16 NOTE — Assessment & Plan Note (Signed)
Continue pristiq

## 2021-08-16 NOTE — ED Notes (Signed)
Currently rating cp 1/10 after receiving nitro tab x1 // MD made aware

## 2021-08-16 NOTE — ED Notes (Signed)
[facility] at bedside.  Handoff report given to Glynis Smiles on 6E at [facility]

## 2021-08-16 NOTE — ED Provider Notes (Signed)
[facility] EMERGENCY DEPT  Provider Note  CSN: [id number] Arrival date & time: 08/16/21 3382  History Chief Complaint  Patient presents with  . Chest Pain    Samantha Molina is a 61 y.o. female with history of HTN, DM and prior bariatric surgery has non-obstructive CAD by CTA Coronaries in Dec 2020 reports she got off work around 11:30pm last night. Was in bed at about 2am when she began to have moderate to severe midsternal chest pressure radiating into her jaw with SOB. No nausea or diaphoresis. No recent leg swelling, no travel. No fevers. She has been seen in the ED for similar pains occasionally over the last couple of years with negative ED workups. She is followed by cardiology. She reports increased stress at home recently due to other family member's medical problems. Pain has improved some, down to 4/10. She has not taken ASA. No prior use of NTG.    Home Medications Prior to Admission medications   Medication Sig Start Date End Date Taking? Authorizing Provider  ALPRAZolam (XANAX) 0.25 MG tablet Take 0.5-1 tablets (0.125-0.25 mg total) by mouth daily as needed. 10/15/20     ALPRAZolam (XANAX) 0.25 MG tablet Take 0.5-1 tablets (0.125-0.25 mg total) by mouth daily as needed. 05/19/21     amLODipine (NORVASC) 10 MG tablet TAKE 1 TABLET BY MOUTH ONCE DAILY 06/19/20   Deatra James, MD  amLODipine (NORVASC) 10 MG tablet Take 1 tablet (10 mg total) by mouth daily. 02/20/21     amLODipine (NORVASC) 10 MG tablet Take 1 tablet (10 mg total) by mouth daily. 05/19/21     amoxicillin-clavulanate (AUGMENTIN) 875-125 MG tablet Take 1 tablet by mouth every 12 (twelve) hours for 10 days 03/03/21     aspirin EC 325 MG tablet Take 1 tablet (325 mg total) by mouth 2 (two) times daily. 03/10/21     atorvastatin (LIPITOR) 10 MG tablet Take 1 tablet (10 mg total) by mouth daily. 07/22/21     cephALEXin (KEFLEX) 250 MG capsule Take 250 mg by mouth at bedtime. Patient not taking: No sig  reported 06/17/17   [provider]  cephALEXin (KEFLEX) 250 MG capsule Take 1 capsule (250 mg total) by mouth daily. 01/02/21     Cholecalciferol (VITAMIN D) 2000 UNITS tablet Take 2,000 Units by mouth daily.    [provider]  clindamycin (CLEOCIN) 300 MG capsule Take 2 capsules (600 mg total) by mouth as directed 1 hour before procedure 05/23/21     desvenlafaxine (PRISTIQ) 50 MG 24 hr tablet Take 50 mg by mouth daily.    [provider]  desvenlafaxine (PRISTIQ) 50 MG 24 hr tablet Take 1 tablet (50 mg total) by mouth daily. 06/10/21     lisinopril-hydrochlorothiazide (ZESTORETIC) 20-25 MG tablet Take 1 tablet by mouth daily. 05/19/21     methocarbamol (ROBAXIN) 500 MG tablet Take 1-2 tablets (500-1,000 mg total) by mouth 4 (four) times daily for 10 days 03/25/21     Multiple Vitamins-Minerals (MULTIVITAMIN WITH MINERALS) tablet Take 1 tablet by mouth daily.    [provider]  oxybutynin (DITROPAN-XL) 10 MG 24 hr tablet TAKE 1 TABLET (10 MG TOTAL) BY MOUTH AT BEDTIME. 05/29/20 08/20/21  Romualdo Bolk, MD  oxyCODONE (OXY IR/ROXICODONE) 5 MG immediate release tablet Take 1-2 tablets (5-10 mg total) by mouth every 6 (six) hours as needed for up to 5 days 03/10/21     oxyCODONE-acetaminophen (PERCOCET) 10-325 MG tablet Take 1 tablet by mouth every 8  hours as needed 09/05/20     oxyCODONE-acetaminophen (PERCOCET) 10-325 MG tablet Take 1 tablet by mouth every 8 hours as needed for 30 days 01/02/21     oxyCODONE-acetaminophen (PERCOCET) 10-325 MG tablet Take 1 tablet by mouth every 8 hours as needed 03/03/21     oxyCODONE-acetaminophen (PERCOCET) 10-325 MG tablet Take 1 tablet by mouth every 8 hours as needed for 30 days 05/15/21     oxyCODONE-acetaminophen (PERCOCET) 10-325 MG tablet Take 1 tablet by mouth every 8 hours as needed 07/10/21     oxyCODONE-acetaminophen (PERCOCET) 10-325 MG tablet Take 1 tablet by mouth every 8 hours as needed 08/08/21     topiramate  (TOPAMAX) 100 MG tablet Take 1 tablet (100 mg total) by mouth 2 (two) times daily. 05/19/21     traZODone (DESYREL) 50 MG tablet Take 50 mg by mouth at bedtime as needed for sleep. Patient not taking: No sig reported    [provider]     Allergies    Patient has no known allergies.   Review of Systems   Review of Systems Please see HPI for pertinent positives and negatives  Physical Exam BP 124/67   Pulse 62   Temp 97.9 F (36.6 C) (Oral)   Resp (!) 21   Ht 5\' 6"  (1.676 m)   Wt 83 kg   SpO2 97%   BMI 29.54 kg/m   Physical Exam Vitals and nursing note reviewed.  Constitutional:      Appearance: Normal appearance.  HENT:     Head: Normocephalic and atraumatic.     Nose: Nose normal.     Mouth/Throat:     Mouth: Mucous membranes are moist.  Eyes:     Extraocular Movements: Extraocular movements intact.     Conjunctiva/sclera: Conjunctivae normal.  Cardiovascular:     Rate and Rhythm: Normal rate.  Pulmonary:     Effort: Pulmonary effort is normal.     Breath sounds: Normal breath sounds.  Abdominal:     General: Abdomen is flat.     Palpations: Abdomen is soft.     Tenderness: There is no abdominal tenderness.  Musculoskeletal:        General: No swelling. Normal range of motion.     Cervical back: Neck supple.  Skin:    General: Skin is warm and dry.  Neurological:     General: No focal deficit present.     Mental Status: She is alert.  Psychiatric:        Mood and Affect: Mood normal.    ED Results / Procedures / Treatments   EKG EKG Interpretation  Date/Time:  Saturday Aug 16 2021 05:48:09 EDT Ventricular Rate:  74 PR Interval:  150 QRS Duration: 90 QT Interval:  396 QTC Calculation: 440 R Axis:   -8 Text Interpretation: Sinus rhythm Left ventricular hypertrophy No significant change since last tracing Confirmed by [id number] [phone]) on 08/16/2021 5:50:14 AM  Procedures Procedures  Medications Ordered in the ED Medications   nitroGLYCERIN (NITROSTAT) SL tablet 0.4 mg (0.4 mg Sublingual Given 08/16/21 08/18/21)  aspirin chewable tablet 324 mg (324 mg Oral Given 08/16/21 0609)  potassium chloride SA (KLOR-CON M) CR tablet 40 mEq (40 mEq Oral Given 08/16/21 08/18/21)    Initial Impression and Plan  Patient with nonobstructive CAD on remote CTA here with an episode of chest pain started while asleep during the night. Improved some but still present. Will check labs, CXR. EKG is unchanged. ASA and NTG for residual  pain.  ED Course   Clinical Course as of 08/16/21 0659  Sat Aug 16, 2021  0625 BMP with mild hypokalemia, will replace orally. CBC is unremarkable. Initial Trop is normal.  [CS]  0633 Pain improved after NTG.  [CS]  0654 Care of the patient will be signed out to Dr. Rhunette CroftNanavati at the change of shift pending repeat Trop and ultimate disposition.  [CS]  [phone] I personally viewed the images from radiology studies and agree with radiologist interpretation: CXR is clear  [CS]    Clinical Course User Index [CS] Pollyann SavoySheldon, Ellenie Salome B, MD     MDM Rules/Calculators/A&P Medical Decision Making Problems Addressed: Chest pain, unspecified type: acute illness or injury  Amount and/or Complexity of Data Reviewed Labs: ordered. Decision-making details documented in ED Course. Radiology: ordered and independent interpretation performed. Decision-making details documented in ED Course. ECG/medicine tests: ordered and independent interpretation performed. Decision-making details documented in ED Course.  Risk OTC drugs. Prescription drug management.    Final Clinical Impression(s) / ED Diagnoses Final diagnoses:  Chest pain, unspecified type    Rx / DC Orders ED Discharge Orders     None        Pollyann SavoySheldon, Melchizedek Espinola B, MD 08/16/21 [phone]

## 2021-08-16 NOTE — Assessment & Plan Note (Addendum)
61 year old female with history of HTN, T2DM, HLD, nonocclusive CAD by CT cardiac in 02/2201 and strong CAD family history presenting with recurrent substernal chest pain relieved with NG   -obs to tele -cardiology consulted -troponin wnl x 2 and ekg without acute changes -chest pain free after NG, no heparin  -pain is also reproducible over sternum arguing against cardiac causes or could have MSK on top of cardiac  -echo pending -received 324 mg ASA, start 81mg  ASA daily  -EDP discussed with cardiology who felt patient could come in for [facility], likely next Tuesday.  -lipid panel in AM, continue lipitor 10mg  for now  -a1c 5.6 in 04/2021

## 2021-08-16 NOTE — Progress Notes (Signed)
  Echocardiogram 2D Echocardiogram has been performed.  Samantha Molina 08/16/2021, 5:38 PM

## 2021-08-16 NOTE — H&P (Addendum)
History and Physical    Patient: Samantha Molina:[id number] DOB: 01/03/1961 DOA: 08/16/2021 DOS: the patient was seen and examined on 08/16/2021 PCP: Deatra JamesSun, Vyvyan, MD  Patient coming from:  [facility]  - lives with her husband    Chief Complaint: chest pain   HPI: Samantha Molina is a 61 y.o. female with medical history significant of CAD, T2DM, HTN, migraine, anxiety and depression, chronic leg pain who presented to ED with complaints of chest pain. She got off work close to midnight last night and around 2 am she stared to have substernal chest pain that radiated to both her jaws and her left hand started to tingle. She laid down and started to rub her chest, but started to have shortness of breath.  She denies any associated N/V or dizziness. Pain was rated 7/10 and described as squeezing.  She got up and walked around, but nothing helped. She states the pain continued so she came to ED. She has had this substernal chest pain in the past. Pain was worse with exertion. Would last around 35-45 minutes. She received NG in ED which helped alleviate the pain. Pain is also reproducible over sternum.   Father died of MI at 2557 and her maternal grandfather died of MI >61 years of age Her mother also has CAD/MI history   Seen in ED on 06/2020 for CP and followed up with her cardiologist, Dr. Cristal Deerhristopher who she initially saw in 2020 for chest pain. Ct cardiac with mild nonobstructive disease in 02/2019. FH of heart disease recommended ASA/lipitor. Appears she hasn't been taking ASA.  Echo: 10/2019: normal echo, no residual VSD flow Cardiac monitor 10/2019: sinus rhythm with PVC burden of 4%.   She does not smoke or drink  ER Course:  vitals: afebrile, bp: 137/75, HR: 77, RR: 23, oxygen: 100%RA Pertinent labs: potassium: 3.2, troponin wnl x 2 CXR: no acute finding  Ekg: NSR rate of 74, no acute changes In ED: given ASA, NG. Dr. Shea EvansPembroke from cardiology was called and recommended option of admission  with [facility] likely on Tuesday vs. Outpatient. Patient did not feel comfortable going home. Chest pain free s/p NG, not started on heparin.   Review of Systems: As mentioned in the history of present illness. All other systems reviewed and are negative. Past Medical History:  Diagnosis Date   Arthritis    Arthritis    both knees   Chronic leg pain    Coronary artery disease    Depression    Diabetes mellitus without complication (HCC)    Heart murmur    as a child   Hypertension    Migraine without aura    Morbid obesity (HCC)    Urinary incontinence    Vitamin D deficiency disease 2013   Past Surgical History:  Procedure Laterality Date   JOINT REPLACEMENT Left 2022   Knee   KNEE ARTHROSCOPY W/ ACL RECONSTRUCTION Left 2004   LAPAROSCOPIC CHOLECYSTECTOMY  2005   LAPAROSCOPIC GASTRIC BANDING  2007   TONSILLECTOMY  1984   Age 61   TOTAL VAGINAL HYSTERECTOMY  age 61   Ovaries remain, for menorrhagia   TUBAL LIGATION     Social History:  reports that she has never smoked. She has never used smokeless tobacco. She reports current alcohol use of about 1.0 standard drink per week. She reports that she does not use drugs.  No Known Allergies  Family History  Problem Relation Age of Onset   Hypertension Mother  Rheum arthritis Mother        Lupus   Heart disease Mother    Lupus Mother    Diabetes Father    Hypertension Father    Stroke Father    Hyperlipidemia Father    Heart disease Father    Lupus Brother    Diabetes Brother    Hyperlipidemia Brother    Breast cancer Maternal Aunt 64       X 2, one in her 43's   Lupus Maternal Aunt    Breast cancer Paternal Aunt 30       died in her 83's   Lymphoma Son    Breast cancer Cousin 18       maternal & paternal cousin   Colon cancer Neg Hx    Colon polyps Neg Hx    Esophageal cancer Neg Hx    Stomach cancer Neg Hx    Rectal cancer Neg Hx     Prior to Admission medications   Medication Sig Start Date End Date  Taking? Authorizing Provider  acetaminophen (TYLENOL) 650 MG CR tablet Take 650 mg by mouth every 8 (eight) hours as needed for pain.   Yes [provider]  ALPRAZolam (XANAX) 0.25 MG tablet Take 0.5-1 tablets (0.125-0.25 mg total) by mouth daily as needed. Patient taking differently: Take 0.125-0.25 mg by mouth daily as needed for anxiety. 10/15/20  Yes   amLODipine (NORVASC) 10 MG tablet Take 1 tablet (10 mg total) by mouth daily. 05/19/21  Yes   atorvastatin (LIPITOR) 10 MG tablet Take 1 tablet (10 mg total) by mouth daily. 07/22/21  Yes   cephALEXin (KEFLEX) 250 MG capsule Take 1 capsule (250 mg total) by mouth daily. 01/02/21  Yes   Cholecalciferol (VITAMIN D) 2000 UNITS tablet Take 2,000 Units by mouth daily.   Yes [provider]  clindamycin (CLEOCIN) 300 MG capsule Take 2 capsules (600 mg total) by mouth as directed 1 hour before procedure Patient taking differently: Take 600 mg by mouth as directed. Before dental procedure 05/23/21  Yes   desvenlafaxine (PRISTIQ) 50 MG 24 hr tablet Take 1 tablet (50 mg total) by mouth daily. 06/10/21  Yes   lisinopril-hydrochlorothiazide (ZESTORETIC) 20-25 MG tablet Take 1 tablet by mouth daily. 05/19/21  Yes   Multiple Vitamins-Minerals (MULTIVITAMIN WITH MINERALS) tablet Take 1 tablet by mouth daily.   Yes [provider]  oxybutynin (DITROPAN-XL) 10 MG 24 hr tablet TAKE 1 TABLET (10 MG TOTAL) BY MOUTH AT BEDTIME. Patient taking differently: Take 10 mg by mouth at bedtime. 05/29/20 08/20/21 Yes Romualdo Bolk, MD  oxyCODONE-acetaminophen (PERCOCET) 10-325 MG tablet Take 1 tablet by mouth every 8 hours as needed Patient taking differently: Take 1 tablet by mouth every 8 (eight) hours as needed for pain. 08/08/21  Yes   topiramate (TOPAMAX) 100 MG tablet Take 1 tablet (100 mg total) by mouth 2 (two) times daily. 05/19/21  Yes   ALPRAZolam (XANAX) 0.25 MG tablet Take 0.5-1 tablets (0.125-0.25 mg total) by mouth daily as needed. Patient  not taking: Reported on 08/16/2021 05/19/21     amLODipine (NORVASC) 10 MG tablet TAKE 1 TABLET BY MOUTH ONCE DAILY Patient not taking: Reported on 08/16/2021 06/19/20   Deatra James, MD  amLODipine (NORVASC) 10 MG tablet Take 1 tablet (10 mg total) by mouth daily. Patient not taking: Reported on 08/16/2021 02/20/21     amoxicillin-clavulanate (AUGMENTIN) 875-125 MG tablet Take 1 tablet by mouth every 12 (twelve) hours for 10 days Patient not  taking: Reported on 08/16/2021 03/03/21     aspirin EC 325 MG tablet Take 1 tablet (325 mg total) by mouth 2 (two) times daily. Patient not taking: Reported on 08/16/2021 03/10/21     methocarbamol (ROBAXIN) 500 MG tablet Take 1-2 tablets (500-1,000 mg total) by mouth 4 (four) times daily for 10 days Patient not taking: Reported on 08/16/2021 03/25/21     oxyCODONE (OXY IR/ROXICODONE) 5 MG immediate release tablet Take 1-2 tablets (5-10 mg total) by mouth every 6 (six) hours as needed for up to 5 days Patient not taking: Reported on 08/16/2021 03/10/21     oxyCODONE-acetaminophen (PERCOCET) 10-325 MG tablet Take 1 tablet by mouth every 8 hours as needed Patient not taking: Reported on 08/16/2021 09/05/20     oxyCODONE-acetaminophen (PERCOCET) 10-325 MG tablet Take 1 tablet by mouth every 8 hours as needed for 30 days Patient not taking: Reported on 08/16/2021 01/02/21     oxyCODONE-acetaminophen (PERCOCET) 10-325 MG tablet Take 1 tablet by mouth every 8 hours as needed Patient not taking: Reported on 08/16/2021 03/03/21     oxyCODONE-acetaminophen (PERCOCET) 10-325 MG tablet Take 1 tablet by mouth every 8 hours as needed for 30 days Patient not taking: Reported on 08/16/2021 05/15/21     oxyCODONE-acetaminophen (PERCOCET) 10-325 MG tablet Take 1 tablet by mouth every 8 hours as needed Patient not taking: Reported on 08/16/2021 07/10/21       Physical Exam: Vitals:   08/16/21 1200 08/16/21 1230 08/16/21 1233 08/16/21 1300  BP: 106/80 102/72  125/78  Pulse: 70 74  78   Resp: (!) 23 (!) 26  20  Temp:   97.9 F (36.6 C) 98.1 F (36.7 C)  TempSrc:   Oral Oral  SpO2: 99% 100%  100%  Weight:    84.7 kg  Height:     (1.676 m)   General:  Appears calm and comfortable and is in NAD Eyes:  PERRL, EOMI, normal lids, iris ENT:  grossly normal hearing, lips & tongue, mmm; appropriate dentition Neck:  no LAD, masses or thyromegaly; no carotid bruits Cardiovascular:  RRR, no m/r/g. No LE edema.  Respiratory:   CTA bilaterally with no wheezes/rales/rhonchi.  Normal respiratory effort. Abdomen:  soft, NT, ND, NABS Back:   normal alignment, no CVAT Skin:  no rash or induration seen on limited exam Musculoskeletal:  grossly normal tone BUE/BLE, good ROM, no bony abnormality Lower extremity:  No LE edema.  Limited foot exam with no ulcerations.  2+ distal pulses. Psychiatric:  grossly normal mood and affect, speech fluent and appropriate, AOx3 Neurologic:  CN 2-12 grossly intact, moves all extremities in coordinated fashion, sensation intact   Radiological Exams on Admission: Independently reviewed - see discussion in A/P where applicable  DG Chest 2 View  Result Date: 08/16/2021 CLINICAL DATA:  Chest pain EXAM: CHEST - 2 VIEW COMPARISON:  07/15/2020 FINDINGS: Low volume chest. Normal heart size and mediastinal contours. There is no edema, consolidation, effusion, or pneumothorax. Lap band in place. IMPRESSION: Low volume chest without acute finding. Electronically Signed   By: Tiburcio Pea M.D.   On: 08/16/2021 06:55    EKG: Independently reviewed.  NSR with rate 74; nonspecific ST changes with no evidence of acute ischemia   Labs on Admission: I have personally reviewed the available labs and imaging studies at the time of the admission.  Pertinent labs:    potassium: 3.2,  troponin wnl x 2  Assessment and Plan: Principal Problem:   Chest pain  with hx of nonocclusive CAD  Active Problems:   Nonocclusive coronary atherosclerosis of native  coronary artery   Hypokalemia   Type 2 diabetes mellitus with other specified complication (HCC)   Essential hypertension   Mild major depression, single episode (HCC)   Chronic pain   Anxiety   Perimembranous ventricular septal defect   Migraine aura without headache    Assessment and Plan: * Chest pain with hx of nonocclusive CAD  61 year old female with history of HTN, T2DM, HLD, nonocclusive CAD by CT cardiac in 02/2201 and strong CAD family history presenting with recurrent substernal chest pain relieved with NG   -obs to tele -cardiology consulted -troponin wnl x 2 and ekg without acute changes -chest pain free after NG, no heparin  -pain is also reproducible over sternum arguing against cardiac causes or could have MSK on top of cardiac  -echo pending -received 324 mg ASA, start 81mg  ASA daily  -EDP discussed with cardiology who felt patient could come in for [facility], likely next Tuesday.  -lipid panel in AM, continue lipitor 10mg  for now  -a1c 5.6 in 04/2021   Hypokalemia repleted with in ED Check magnesium Trend   Type 2 diabetes mellitus with other specified complication (HCC) Very well controlled. A1C of 5.6 in February of 2023 Diet controlled SSI and accuchecks qac/qhs while inpatient   Essential hypertension Very well controlled to soft readings. Continue norvasc, hold Zestoretic for now   Chronic pain On oxycodone prn for chronic leg pain pmp website reviewed. She is filling px correctly Continue home dosed oxycodone prn inpatient   Mild major depression, single episode (HCC) Continue pristiq   Anxiety Continue very low dose xanax prn   Migraine aura without headache Continue Topamax   Perimembranous ventricular septal defect Echo 10/2019: normal echo/EF. No residual VSD flow seen     Advance Care Planning:   Code Status: Full Code   Consults: cardiology   DVT Prophylaxis: lovenox   Family Communication: husband at bedside: Saima Monterroso    Severity of Illness: The appropriate patient status for this patient is OBSERVATION. Observation status is judged to be reasonable and necessary in order to provide the required intensity of service to ensure the patient's safety. The patient's presenting symptoms, physical exam findings, and initial radiographic and laboratory data in the context of their medical condition is felt to place them at decreased risk for further clinical deterioration. Furthermore, it is anticipated that the patient will be medically stable for discharge from the hospital within 2 midnights of admission.   Author: 11/2019, MD 08/16/2021 3:09 PM  For on call review [email or web address].

## 2021-08-16 NOTE — Consult Note (Signed)
Cardiology Consultation:   Patient ID: Samantha Molina MRN: [id number]; DOB: 11-Dec-1960  Admit date: 08/16/2021 Date of Consult: 08/16/2021  PCP:  Samantha James, MD   [facility] Providers Cardiologist:  Samantha Red, MD   {  Patient Profile:   Samantha Molina is a 61 y.o. female with a hx of of nonobstructive CAD by CTA 02/2019, diet controlled diabetes mellitus, morbid obesity, status post lap band, anxiety, hypertension and depression who is being seen 08/16/2021 for the evaluation of chest pain at the request of Samantha Molina.  Prior history of chest pain with reassuring work-up. CT coronary December 2020 with calcium score of 114.  Mild nonobstructive CAD.  Likely premembranous VSD.  Echocardiogram August 2021 with LV function of 60 to 65%.  No residual VSD. Monitor August 2021: sinus rhythm with PVC (4% burden). Her palpitations was associated with PVCs.  Patient declined beta-blocker or calcium channel blocker.  Last seen by Samantha Molina May 2022.   History of Present Illness:   Samantha Molina got out of work around midnight.  She was laying in bed watching TV when starting to having midsternal chest discomfort around 2 AM.  Describes her pain as a dull achy.  Associated with shortness of breath. Radiated to bilateral jaw.  No diaphoresis, nausea or vomiting.  She had meal at work.  Symptoms persisted at 7 out of 10 leading to ER evaluation. Given SL nitro x 1 with improved chest pain.  Currently having 1 out of 10 chest discomfort.  She does not think her symptoms due to GERD.  Hs-troponin 5>>5 K 3.2 Scr 0.82 Hgb 12.2 Chest x-ray without acute findings   Patient reports intermittent chest discomfort for past 1 month.  Self resolved within 1 hour.  This was occurring with and without exertion.  Nothing makes better or worse.  The past 1 months she has started doing Zumba and exercise at [facility].  Has couple of sessions without any limitation or exertional  symptoms.  Mother with history of CAD and MI. Father died of MI at age 33 Maternal grandfather died of MI at age 30   Past Medical History:  Diagnosis Date   Arthritis    Arthritis    both knees   Chronic leg pain    Coronary artery disease    Depression    Diabetes mellitus without complication (HCC)    Heart murmur    as a child   Hypertension    Migraine without aura    Morbid obesity (HCC)    Urinary incontinence    Vitamin D deficiency disease 2013    Past Surgical History:  Procedure Laterality Date   JOINT REPLACEMENT Left 2022   Knee   KNEE ARTHROSCOPY W/ ACL RECONSTRUCTION Left 2004   LAPAROSCOPIC CHOLECYSTECTOMY  2005   LAPAROSCOPIC GASTRIC BANDING  2007   TONSILLECTOMY  1984   Age 38   TOTAL VAGINAL HYSTERECTOMY  age 66   Ovaries remain, for menorrhagia   TUBAL LIGATION       Inpatient Medications: Scheduled Meds:  amLODipine  10 mg Oral Daily   [START ON 08/17/2021] aspirin  81 mg Oral Daily   atorvastatin  10 mg Oral Daily   cephALEXin  250 mg Oral Daily   enoxaparin (LOVENOX) injection  40 mg Subcutaneous Q24H   insulin aspart  0-9 Units Subcutaneous TID WC   oxybutynin  10 mg Oral QHS   topiramate  100 mg Oral BID   venlafaxine XR  75  mg Oral Q breakfast   Continuous Infusions:  PRN Meds: acetaminophen, ALPRAZolam, nitroGLYCERIN, ondansetron (ZOFRAN) IV, oxyCODONE-acetaminophen  Allergies:   No Known Allergies  Social History:   Social History   Socioeconomic History   Marital status: Married    Spouse name: Not on file   Number of children: 5   Years of education: Not on file   Highest education level: Not on file  Occupational History   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  Tobacco Use   Smoking status: Never   Smokeless tobacco: Never  Vaping Use   Vaping Use: Never used  Substance and Sexual Activity   Alcohol use: Yes    Alcohol/week: 1.0 standard drink    Types: 1 Standard drinks or equivalent  per week    Comment: rarely   Drug use: No   Sexual activity: Not Currently    Partners: Male    Birth control/protection: Surgical    Comment: TVH  Other Topics Concern   Not on file  Social History Narrative   Not on file   Social Determinants of Health   Financial Resource Strain: Not on file  Food Insecurity: Not on file  Transportation Needs: Not on file  Physical Activity: Not on file  Stress: Not on file  Social Connections: Not on file  Intimate Partner Violence: Not on file    Family History:   Family History  Problem Relation Age of Onset   Hypertension Mother    Rheum arthritis Mother        Lupus   Heart disease Mother    Lupus Mother    Diabetes Father    Hypertension Father    Stroke Father    Hyperlipidemia Father    Heart disease Father    Lupus Brother    Diabetes Brother    Hyperlipidemia Brother    Breast cancer Maternal Aunt [id number]       X 2, one in her 9650's   Lupus Maternal Aunt    Breast cancer Paternal Aunt 30       died in her 9240's   Lymphoma Son    Breast cancer Cousin [phone]       maternal & paternal cousin   Colon cancer Neg Hx    Colon polyps Neg Hx    Esophageal cancer Neg Hx    Stomach cancer Neg Hx    Rectal cancer Neg Hx      ROS:  Please see the history of present illness.  All other ROS reviewed and negative.     Physical Exam/Data:   Vitals:   08/16/21 1200 08/16/21 1230 08/16/21 1233 08/16/21 1300  BP: 106/80 102/72  125/78  Pulse: 70 74  78  Resp: (!) 23 (!) 26  20  Temp:   97.9 F (36.6 C) 98.1 F (36.7 C)  TempSrc:   Oral Oral  SpO2: 99% 100%  100%  Weight:    84.7 kg  Height:    5\' 6"  (1.676 m)    Intake/Output Summary (Last 24 hours) at 08/16/2021 1504 Last data filed at 08/16/2021 1300 Gross per 24 hour  Intake 240 ml  Output --  Net 240 ml      08/16/2021    1:00 PM 08/16/2021    5:44 AM 11/15/2020   10:39 AM  Last 3 Weights  Weight (lbs) 186 lb 11.2 oz 183 lb 189 lb  Weight (kg) 84.687 kg 83.008 kg  85.73 kg  Body mass index is 30.13 kg/m.  General:  Well nourished, well developed, in no acute distress HEENT: normal Neck: no JVD Vascular: No carotid bruits; Distal pulses 2+ bilaterally Cardiac:  normal S1, S2; RRR; + murmur  Lungs:  clear to auscultation bilaterally, no wheezing, rhonchi or rales  Abd: soft, nontender, no hepatomegaly  Ext: no edema Musculoskeletal:  No deformities, BUE and BLE strength normal and equal Skin: warm and dry  Neuro:  CNs 2-12 intact, no focal abnormalities noted Psych:  Normal affect   EKG:  The EKG was personally reviewed and demonstrates:  NSR Telemetry:  Telemetry was personally reviewed and demonstrates:  SR, PVC  Relevant CV Studies:  Monitor 10/2019 ~7 days of data recorded on Zio monitor. Patient had a min HR of 47 bpm, max HR of 187 bpm, and avg HR of 77 bpm. Predominant underlying rhythm was Sinus Rhythm. No atrial fibrillation, high degree block, or pauses noted. One brief episode of NSVT, 4 beats. 3 SVT events noted, no longer than 4 beats. Isolated atrial ectopy was rare (<1%). Isolated ventricular ectopy was occasional (4.3%). There were 32 triggered events. These were sinus with frequent association to PVCs.  Echo 10/2019  1. Normal LV function; there appears to be a small aneurysmal segment in  the membranous septum but no obvious flow suggestive of residual VSD.   2. Left ventricular ejection fraction, by estimation, is 60 to 65%. The  left ventricle has normal function. The left ventricle has no regional  wall motion abnormalities. Left ventricular diastolic parameters were  normal.   3. Right ventricular systolic function is normal. The right ventricular  size is normal. There is normal pulmonary artery systolic pressure.   4. The mitral valve is normal in structure. Mild mitral valve  regurgitation. No evidence of mitral stenosis.   5. The aortic valve is tricuspid. Aortic valve regurgitation is trivial.  No aortic stenosis  is present.   6. The inferior vena cava is normal in size with greater than 50%  respiratory variability, suggesting right atrial pressure of 3 mmHg.   CT coronary 02/2019 IMPRESSION: 1. Mild nonobstructive CAD, CADRADS = 2.   2. Coronary calcium score of 114. This was 94th percentile for age and sex matched control.   3. Normal coronary origin with right dominance.   4. Likely perimembranous VSD.  Laboratory Data:  High Sensitivity Troponin:   Recent Labs  Lab 08/16/21 0548 08/16/21 0759  TROPONINIHS 5 5     Chemistry Recent Labs  Lab 08/16/21 0548  NA 140  K 3.2*  CL 104  CO2 27  GLUCOSE 104*  BUN 24*  CREATININE 0.82  CALCIUM 9.9  GFRNONAA >60  ANIONGAP 9     Hematology Recent Labs  Lab 08/16/21 0548  WBC 10.4  RBC 5.35*  HGB 12.2  HCT 39.3  MCV 73.5*  MCH 22.8*  MCHC 31.0  RDW 16.5*  PLT 395    Radiology/Studies:  DG Chest 2 View  Result Date: 08/16/2021 CLINICAL DATA:  Chest pain EXAM: CHEST - 2 VIEW COMPARISON:  07/15/2020 FINDINGS: Low volume chest. Normal heart size and mediastinal contours. There is no edema, consolidation, effusion, or pneumothorax. Lap band in place. IMPRESSION: Low volume chest without acute finding. Electronically Signed   By: Tiburcio Pea M.D.   On: 08/16/2021 06:55     Assessment and Plan:   Chest pain Mixed symptoms.  Intermittent for past 1 month but not typically exertional.  Now at rest.  Symptoms improved after sublingual nitroglycerin x1.  Troponin negative.  EKG without acute ischemic changes.  Prior coronary CTA in December 2020 with mild nonobstructive CAD. Still 1/10 chest discomfort. Will add Imdur 30mg . Will keep overnight and decide inpatient vs outpatient work up.  Continue ASA, Amlodipine and statin  No results found for requested labs within last 8760 hours.  - Check lipid panel and A1c  2. HTN - BP stable on Amlodipine   3. Hypokalemia - Supplemented   Risk Assessment/Risk Scores:  { HEAR  Score (for undifferentiated chest pain):  HEAR Score: 2{  For questions or updates, please contact [facility] Please consult [email or web address] for contact info under    , PA  08/16/2021 3:04 PM

## 2021-08-16 NOTE — Assessment & Plan Note (Signed)
On oxycodone prn for chronic leg pain pmp website reviewed. She is filling px correctly Continue home dosed oxycodone prn inpatient

## 2021-08-16 NOTE — Assessment & Plan Note (Signed)
Echo 10/2019: normal echo/EF. No residual VSD flow seen

## 2021-08-16 NOTE — ED Provider Notes (Signed)
  Physical Exam  BP 124/79   Pulse 71   Temp 97.9 F (36.6 C) (Oral)   Resp 18   Ht 5\' 6"  (1.676 m)   Wt 83 kg   SpO2 100%   BMI 29.54 kg/m   Physical Exam  Procedures  Procedures  ED Course / MDM   Clinical Course as of 08/16/21 0956  Sat Aug 16, 2021  0625 BMP with mild hypokalemia, will replace orally. CBC is unremarkable. Initial Trop is normal.  [CS]  0633 Pain improved after NTG.  [CS]  0654 Care of the patient will be signed out to Dr. Aug 18, 2021 at the change of shift pending repeat Trop and ultimate disposition.  [CS]  [phone] I personally viewed the images from radiology studies and agree with radiologist interpretation: CXR is clear  [CS]    Clinical Course User Index [CS] 0938, MD   Medical Decision Making Amount and/or Complexity of Data Reviewed Labs: ordered. Radiology: ordered.  Risk OTC drugs. Prescription drug management. Decision regarding hospitalization.    Spoke with Dr. Pollyann Savoy, cardiology.  They recommend that patient be given the option of admission, with catheterization most likely on Tuesday versus outpatient follow-up, with strict ER return precautions.  Patient was given this option.  Patient prefers being monitored in the ED and getting the catheterization done on Tuesday.  There is family history of CAD that is concerning to her.  Discussed case with medicine who will admit.  Troponin x2 normal.  At this time we will not start her on heparin, as she is chest pain-free post nitroglycerin.       Thursday, MD 08/16/21 [phone]

## 2021-08-16 NOTE — Assessment & Plan Note (Signed)
Continue very low dose xanax prn

## 2021-08-17 ENCOUNTER — Other Ambulatory Visit: Payer: Self-pay | Admitting: Cardiology

## 2021-08-17 DIAGNOSIS — R072 Precordial pain: Secondary | ICD-10-CM | POA: Diagnosis not present

## 2021-08-17 DIAGNOSIS — G43109 Migraine with aura, not intractable, without status migrainosus: Secondary | ICD-10-CM | POA: Diagnosis not present

## 2021-08-17 DIAGNOSIS — I208 Other forms of angina pectoris: Secondary | ICD-10-CM

## 2021-08-17 DIAGNOSIS — I340 Nonrheumatic mitral (valve) insufficiency: Secondary | ICD-10-CM

## 2021-08-17 DIAGNOSIS — F329 Major depressive disorder, single episode, unspecified: Secondary | ICD-10-CM | POA: Diagnosis not present

## 2021-08-17 DIAGNOSIS — R0789 Other chest pain: Secondary | ICD-10-CM

## 2021-08-17 DIAGNOSIS — I10 Essential (primary) hypertension: Secondary | ICD-10-CM | POA: Diagnosis not present

## 2021-08-17 DIAGNOSIS — F419 Anxiety disorder, unspecified: Secondary | ICD-10-CM | POA: Diagnosis not present

## 2021-08-17 DIAGNOSIS — I2510 Atherosclerotic heart disease of native coronary artery without angina pectoris: Secondary | ICD-10-CM | POA: Diagnosis not present

## 2021-08-17 DIAGNOSIS — E119 Type 2 diabetes mellitus without complications: Secondary | ICD-10-CM | POA: Diagnosis not present

## 2021-08-17 DIAGNOSIS — E876 Hypokalemia: Secondary | ICD-10-CM | POA: Diagnosis not present

## 2021-08-17 DIAGNOSIS — Z79899 Other long term (current) drug therapy: Secondary | ICD-10-CM | POA: Diagnosis not present

## 2021-08-17 DIAGNOSIS — Z96652 Presence of left artificial knee joint: Secondary | ICD-10-CM | POA: Diagnosis not present

## 2021-08-17 LAB — BASIC METABOLIC PANEL
Anion gap: 6 (ref 5–15)
BUN: 19 mg/dL (ref 8–23)
CO2: 27 mmol/L (ref 22–32)
Calcium: 9.7 mg/dL (ref 8.9–10.3)
Chloride: 110 mmol/L (ref 98–111)
Creatinine, Ser: 0.74 mg/dL (ref 0.44–1.00)
GFR, Estimated: >60 mL/min (ref >60)
Glucose, Bld: 100 mg/dL — ABNORMAL HIGH (ref 70–99)
Potassium: 3.5 mmol/L (ref 3.5–5.1)
Sodium: 143 mmol/L (ref 135–145)

## 2021-08-17 LAB — HEMOGLOBIN A1C
Hgb A1c MFr Bld: 6.1 % — ABNORMAL HIGH (ref 4.8–5.6)
Mean Plasma Glucose: 128.37 mg/dL

## 2021-08-17 LAB — LIPID PANEL
Cholesterol: 122 mg/dL (ref 0–200)
HDL: 38 mg/dL — ABNORMAL LOW (ref >40)
LDL Cholesterol: 63 mg/dL (ref 0–99)
Total CHOL/HDL Ratio: 3.2 RATIO
Triglycerides: 105 mg/dL (ref <150)
VLDL: 21 mg/dL (ref 0–40)

## 2021-08-17 LAB — GLUCOSE, CAPILLARY: Glucose-Capillary: 101 mg/dL — ABNORMAL HIGH (ref 70–99)

## 2021-08-17 LAB — TROPONIN I (HIGH SENSITIVITY): Troponin I (High Sensitivity): 4 ng/L (ref <18)

## 2021-08-17 MED ORDER — ISOSORBIDE MONONITRATE ER 30 MG PO TB24
30.0000 mg | ORAL_TABLET | Freq: Every day | ORAL | 1 refills | Status: DC
  Filled 2021-08-17: qty 30, 30d supply, fill #0

## 2021-08-17 MED ORDER — ASPIRIN 81 MG PO CHEW
81.0000 mg | CHEWABLE_TABLET | Freq: Every day | ORAL | 1 refills | Status: DC
Start: 2021-08-18 — End: 2021-11-04
  Filled 2021-08-17: qty 30, 30d supply, fill #0
  Filled 2021-09-26: qty 30, 30d supply, fill #1

## 2021-08-17 NOTE — Progress Notes (Signed)
DAILY PROGRESS NOTE   Patient Name: Samantha Molina Date of Encounter: 08/17/2021 Cardiologist: Jodelle RedBridgette Christopher, MD  Chief Complaint   No further chest pain  Patient Profile   Samantha Molina is a 61 y.o. female with a hx of of nonobstructive CAD by CTA 02/2019, diet controlled diabetes mellitus, morbid obesity, status post lap band, anxiety, hypertension and depression who is being seen 08/16/2021 for the evaluation of chest pain at the request of Dr. Artis FlockWolfe.  Subjective   No chest pain overnight.  Echo yesterday showed normal LVEF with mild to moderate mitral regurgitation, somewhat worse than previously noted.  Her mitral and tricuspid valves appeared somewhat thickened, possibly congenitally.  She does have some prolapse of the anterior leaflet at end systole.  This will need to be monitored.  Overall though, I suspect that her chest pain is noncardiac.  Objective   Vitals:   08/16/21 2000 08/16/21 2341 08/17/21 0401 08/17/21 0852  BP: 115/64 (!) 103/59 99/65 115/71  Pulse:  77 66   Resp:  20 18   Temp:  98.2 F (36.8 C) 98.4 F (36.9 C)   TempSrc:  Oral Oral   SpO2:  99% 98%   Weight:      Height:        Intake/Output Summary (Last 24 hours) at 08/17/2021 1054 Last data filed at 08/16/2021 2155 Gross per 24 hour  Intake 720 ml  Output --  Net 720 ml   Filed Weights   08/16/21 0544 08/16/21 1300  Weight: 83 kg 84.7 kg    Physical Exam   General appearance: alert and no distress Lungs: clear to auscultation bilaterally Heart: regular rate and rhythm, S1, S2 normal, and systolic murmur: early systolic 2/6, blowing at apex Abdomen: soft, non-tender; bowel sounds normal; no masses,  no organomegaly Extremities: extremities normal, atraumatic, no cyanosis or edema Neurologic: Grossly normal  Inpatient Medications    Scheduled Meds:  amLODipine  10 mg Oral Daily   aspirin  81 mg Oral Daily   atorvastatin  10 mg Oral Daily   cephALEXin  250 mg  Oral Daily   enoxaparin (LOVENOX) injection  40 mg Subcutaneous Q24H   isosorbide mononitrate  30 mg Oral Daily   oxybutynin  10 mg Oral QHS   topiramate  100 mg Oral BID   venlafaxine XR  75 mg Oral Q breakfast    Continuous Infusions:   PRN Meds: acetaminophen, ALPRAZolam, nitroGLYCERIN, ondansetron (ZOFRAN) IV, oxyCODONE-acetaminophen   Labs   Results for orders placed or performed during the hospital encounter of 08/16/21 (from the past 48 hour(s))  Basic metabolic panel     Status: Abnormal   Collection Time: 08/16/21  5:48 AM  Result Value Ref Range   Sodium 140 135 - 145 mmol/L   Potassium 3.2 (L) 3.5 - 5.1 mmol/L   Chloride 104 98 - 111 mmol/L   CO2 27 22 - 32 mmol/L   Glucose, Bld 104 (H) 70 - 99 mg/dL    Comment: Glucose reference range applies only to samples taken after fasting for at least 8 hours.   BUN 24 (H) 8 - 23 mg/dL   Creatinine, Ser 1.610.82 0.44 - 1.00 mg/dL   Calcium 9.9 8.9 - 09.610.3 mg/dL   GFR, Estimated >04>60 >54>60 mL/min    Comment: (NOTE) Calculated using the CKD-EPI Creatinine Equation (2021)    Anion gap 9 5 - 15    Comment: Performed at [facility], [address], [place],  [place] [address]  Troponin I (High Sensitivity)     Status: None   Collection Time: 08/16/21  5:48 AM  Result Value Ref Range   Troponin I (High Sensitivity) 5 <18 ng/L    Comment: (NOTE) Elevated high sensitivity troponin I (hsTnI) values and significant  changes across serial measurements may suggest ACS but many other  chronic and acute conditions are known to elevate hsTnI results.  Refer to the "Links" section for chest pain algorithms and additional  guidance. Performed at [facility], [address] [place], [place] [address]   CBC with Differential     Status: Abnormal   Collection Time: 08/16/21  5:48 AM  Result Value Ref Range   WBC 10.4 4.0 - 10.5 K/uL   RBC 5.35 (H) 3.87 - 5.11 MIL/uL   Hemoglobin 12.2 12.0 - 15.0  g/dL   HCT 94.4 96.7 - 59.1 %   MCV 73.5 (L) 80.0 - 100.0 fL   MCH 22.8 (L) 26.0 - 34.0 pg   MCHC 31.0 30.0 - 36.0 g/dL   RDW 63.8 (H) 46.6 - 59.9 %   Platelets 395 150 - 400 K/uL   nRBC 0.0 0.0 - 0.2 %   Neutrophils Relative % 64 %   Neutro Abs 6.7 1.7 - 7.7 K/uL   Lymphocytes Relative 26 %   Lymphs Abs 2.7 0.7 - 4.0 K/uL   Monocytes Relative 9 %   Monocytes Absolute 0.9 0.1 - 1.0 K/uL   Eosinophils Relative 0 %   Eosinophils Absolute 0.0 0.0 - 0.5 K/uL   Basophils Relative 0 %   Basophils Absolute 0.0 0.0 - 0.1 K/uL   Immature Granulocytes 1 %   Abs Immature Granulocytes 0.06 0.00 - 0.07 K/uL    Comment: Performed at [facility], [address] [place], [place] [address]  Troponin I (High Sensitivity)     Status: None   Collection Time: 08/16/21  7:59 AM  Result Value Ref Range   Troponin I (High Sensitivity) 5 <18 ng/L    Comment: (NOTE) Elevated high sensitivity troponin I (hsTnI) values and significant  changes across serial measurements may suggest ACS but many other  chronic and acute conditions are known to elevate hsTnI results.  Refer to the "Links" section for chest pain algorithms and additional  guidance. Performed at [facility], [address] [place], [place] [address]   Magnesium     Status: None   Collection Time: 08/16/21  2:36 PM  Result Value Ref Range   Magnesium 2.2 1.7 - 2.4 mg/dL    Comment: Performed at [facility], [address] [place], [place] [address]  Vitamin B12     Status: None   Collection Time: 08/16/21  3:53 PM  Result Value Ref Range   Vitamin B-12 227 180 - 914 pg/mL    Comment: (NOTE) This assay is not validated for testing neonatal or myeloproliferative syndrome specimens for Vitamin B12 levels. Performed at [facility], [address], [place], [place] [address]   HIV Antibody (routine testing w rflx)     Status: None   Collection Time: 08/16/21  3:53 PM  Result  Value Ref Range   HIV Screen 4th Generation wRfx Non Reactive Non Reactive    Comment: Performed at [facility], [address] [place], [place] [address]  Troponin I (High Sensitivity)     Status: None   Collection Time: 08/16/21  9:42 PM  Result Value Ref  Range   Troponin I (High Sensitivity) 4 <18 ng/L    Comment: (NOTE) Elevated high sensitivity troponin I (hsTnI) values and significant  changes across serial measurements may suggest ACS but many other  chronic and acute conditions are known to elevate hsTnI results.  Refer to the "Links" section for chest pain algorithms and additional  guidance. Performed at [facility], [address], [place], [place] [address]   Glucose, capillary     Status: Abnormal   Collection Time: 08/16/21  9:51 PM  Result Value Ref Range   Glucose-Capillary 110 (H) 70 - 99 mg/dL    Comment: Glucose reference range applies only to samples taken after fasting for at least 8 hours.  Troponin I (High Sensitivity)     Status: None   Collection Time: 08/16/21 11:29 PM  Result Value Ref Range   Troponin I (High Sensitivity) 4 <18 ng/L    Comment: (NOTE) Elevated high sensitivity troponin I (hsTnI) values and significant  changes across serial measurements may suggest ACS but many other  chronic and acute conditions are known to elevate hsTnI results.  Refer to the "Links" section for chest pain algorithms and additional  guidance. Performed at [facility], [address], [place], [place] [address]   Lipid panel     Status: Abnormal   Collection Time: 08/17/21 12:06 AM  Result Value Ref Range   Cholesterol 122 0 - 200 mg/dL   Triglycerides 147 <829 mg/dL   HDL 38 (L) >56 mg/dL   Total CHOL/HDL Ratio 3.2 RATIO   VLDL 21 0 - 40 mg/dL   LDL Cholesterol 63 0 - 99 mg/dL    Comment:        Total Cholesterol/HDL:CHD Risk Coronary Heart Disease Risk Table                     Men   Women  1/2 Average Risk   3.4   3.3  Average Risk        5.0   4.4  2 X Average Risk   9.6   7.1  3 X Average Risk  23.4   11.0        Use the calculated Patient Ratio above and the CHD Risk Table to determine the patient's CHD Risk.        ATP III CLASSIFICATION (LDL):  <100     mg/dL   Optimal  213-086  mg/dL   Near or Above                    Optimal  130-159  mg/dL   Borderline  578-469  mg/dL   High  >629     mg/dL   Very High Performed at [facility], [address] [place], [place] [address]   Basic metabolic panel     Status: Abnormal   Collection Time: 08/17/21 12:06 AM  Result Value Ref Range   Sodium 143 135 - 145 mmol/L   Potassium 3.5 3.5 - 5.1 mmol/L   Chloride 110 98 - 111 mmol/L   CO2 27 22 - 32 mmol/L   Glucose, Bld 100 (H) 70 - 99 mg/dL    Comment: Glucose reference range applies only to samples taken after fasting for at least 8 hours.   BUN 19 8 - 23 mg/dL   Creatinine, Ser 3.24 0.44 - 1.00 mg/dL   Calcium 9.7 8.9 - 40.1 mg/dL   GFR, Estimated >02 >  60 mL/min    Comment: (NOTE) Calculated using the CKD-EPI Creatinine Equation (2021)    Anion gap 6 5 - 15    Comment: Performed at [facility], [address], [place], [place] [address]  Hemoglobin A1c     Status: Abnormal   Collection Time: 08/17/21 12:06 AM  Result Value Ref Range   Hgb A1c MFr Bld 6.1 (H) 4.8 - 5.6 %    Comment: (NOTE) Pre diabetes:          5.7%-6.4%  Diabetes:              >6.4%  Glycemic control for   <7.0% adults with diabetes    Mean Plasma Glucose 128.37 mg/dL    Comment: Performed at [facility], [address], [place], [place] [address]  Glucose, capillary     Status: Abnormal   Collection Time: 08/17/21  8:24 AM  Result Value Ref Range   Glucose-Capillary 101 (H) 70 - 99 mg/dL    Comment: Glucose reference range applies only to samples taken after fasting for at least 8 hours.    ECG   N/A  Telemetry   Sinus rhythm- Personally Reviewed  Radiology    DG Chest 2 View  Result Date:  08/16/2021 CLINICAL DATA:  Chest pain EXAM: CHEST - 2 VIEW COMPARISON:  07/15/2020 FINDINGS: Low volume chest. Normal heart size and mediastinal contours. There is no edema, consolidation, effusion, or pneumothorax. Lap band in place. IMPRESSION: Low volume chest without acute finding. Electronically Signed   By: Tiburcio Pea M.D.   On: 08/16/2021 06:55   ECHOCARDIOGRAM COMPLETE  Result Date: 08/16/2021    ECHOCARDIOGRAM REPORT   Patient Name:   Samantha Molina [facility] Date of Exam: 08/16/2021 Medical Rec #:  [id number]           Height:       66.0 in Accession #:    [id number]          Weight:       186.7 lb Date of Birth:  Nov 03, 1960           BSA:          1.942 m Patient Age:    61 years            BP:           128/90 mmHg Patient Gender: F                   HR:           60 bpm. Exam Location:  Inpatient Procedure: 2D Echo, Cardiac Doppler and Color Doppler Indications:    Chest pain  History:        Patient has prior history of Echocardiogram examinations, most                 recent 10/24/2019. CAD; Risk Factors:Hypertension and Diabetes.                 History of murmur and perimembranous VSD at birth.  Sonographer:    Ross Ludwig RDCS (AE) Referring Phys: [id number] Orland Mustard IMPRESSIONS  1. Left ventricular ejection fraction, by estimation, is 60 to 65%. The left ventricle has normal function. The left ventricle has no regional wall motion abnormalities. There is mild left ventricular hypertrophy. Left ventricular diastolic parameters are consistent with Grade I diastolic dysfunction (impaired relaxation).  2. Right ventricular systolic function is normal. The right ventricular size is normal. There  is normal pulmonary artery systolic pressure. The estimated right ventricular systolic pressure is 26.0 mmHg.  3. 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 Mild to moderate mitral valve regurgitation. There is moderate late systolic prolapse of multiple segments of the anterior leaflet of the mitral valve.  4. The  tricuspid valve is myxomatous. There is mild tricuspid regurgitation.  5. The aortic valve is tricuspid. Aortic valve regurgitation is trivial.  6. The inferior vena cava is normal in size with greater than 50% respiratory variability, suggesting right atrial pressure of 3 mmHg.  7. No evidence for residual VSD Comparison(s): Changes from prior study are noted. 10/24/2019: LVEF 60-65%. FINDINGS  Left Ventricle: Left ventricular ejection fraction, by estimation, is 60 to 65%. The left ventricle has normal function. The left ventricle has no regional wall motion abnormalities. The left ventricular internal cavity size was normal in size. There is  mild left ventricular hypertrophy. Left ventricular diastolic parameters are consistent with Grade I diastolic dysfunction (impaired relaxation). Normal left ventricular filling pressure. Right Ventricle: The right ventricular size is normal. No increase in right ventricular wall thickness. Right ventricular systolic function is normal. There is normal pulmonary artery systolic pressure. The tricuspid regurgitant velocity is 2.40 m/s, and  with an assumed right atrial pressure of 3 mmHg, the estimated right ventricular systolic pressure is 26.0 mmHg. Left Atrium: Left atrial size was normal in size. Right Atrium: Right atrial size was normal in size. Pericardium: There is no evidence of pericardial effusion. Mitral Valve: The mitral valve is myxomatous. There is moderate late systolic prolapse of multiple segments of the anterior leaflet of the mitral valve. There is moderate thickening of the anterior mitral valve leaflet(s). Mild to moderate mitral valve regurgitation, with posteriorly-directed jet. Tricuspid Valve: The tricuspid valve is myxomatous. Tricuspid valve regurgitation is mild. Aortic Valve: The aortic valve is tricuspid. Aortic valve regurgitation is trivial. Aortic valve mean gradient measures 6.0 mmHg. Aortic valve peak gradient measures 12.0 mmHg. Aortic valve  area, by VTI measures 1.85 cm. Pulmonic Valve: The pulmonic valve was normal in structure. Pulmonic valve regurgitation is not visualized. Aorta: The aortic root and ascending aorta are structurally normal, with no evidence of dilitation. Venous: The inferior vena cava is normal in size with greater than 50% respiratory variability, suggesting right atrial pressure of 3 mmHg. IAS/Shunts: No atrial level shunt detected by color flow Doppler.  LEFT VENTRICLE PLAX 2D LVIDd:         3.90 cm   Diastology LVIDs:         2.90 cm   LV e' medial:    7.62 cm/s LV PW:         1.40 cm   LV E/e' medial:  10.3 LV IVS:        1.30 cm   LV e' lateral:   13.80 cm/s LVOT diam:     1.90 cm   LV E/e' lateral: 5.7 LV SV:         67 LV SV Index:   34 LVOT Area:     2.84 cm  RIGHT VENTRICLE RV Basal diam:  2.40 cm LEFT ATRIUM           Index        RIGHT ATRIUM           Index LA diam:      2.80 cm 1.44 cm/m   RA Area:     14.10 cm LA Vol (A2C): 23.9 ml 12.31 ml/m  RA Volume:  31.40 ml  16.17 ml/m LA Vol (A4C): 38.8 ml 19.98 ml/m  AORTIC VALVE AV Area (Vmax):    1.72 cm AV Area (Vmean):   1.76 cm AV Area (VTI):     1.85 cm AV Vmax:           173.00 cm/s AV Vmean:          113.000 cm/s AV VTI:            0.361 m AV Peak Grad:      12.0 mmHg AV Mean Grad:      6.0 mmHg LVOT Vmax:         105.00 cm/s LVOT Vmean:        70.200 cm/s LVOT VTI:          0.236 m LVOT/AV VTI ratio: 0.65  AORTA Ao Root diam: 2.80 cm Ao Asc diam:  3.30 cm MITRAL VALVE               TRICUSPID VALVE MV Area (PHT): 2.95 cm    TR Peak grad:   23.0 mmHg MV Decel Time: 257 msec    TR Vmax:        240.00 cm/s MV E velocity: 78.60 cm/s MV A velocity: 95.40 cm/s  SHUNTS MV E/A ratio:  0.82        Systemic VTI:  0.24 m                            Systemic Diam: 1.90 cm Zoila Shutter MD Electronically signed by Zoila Shutter MD Signature Date/Time: 08/16/2021/8:04:14 PM    Final     Cardiac Studies   See echo above  Assessment   Principal Problem:   Chest  pain with hx of nonocclusive CAD  Active Problems:   Essential hypertension   Perimembranous ventricular septal defect   Nonocclusive coronary atherosclerosis of native coronary artery   Anxiety   Type 2 diabetes mellitus with other specified complication (HCC)   Mild major depression, single episode (HCC)   Migraine aura without headache   Chronic pain   Hypokalemia   Plan   Ms. Mccormick has had no further chest pain.  Echo is essentially unchanged though she does have some worsening mitral regurgitation what appears to be late systolic prolapse of the anterior leaflet with thickening of the mitral and tricuspid leaflets.  This will need to be followed up on.  I would recommend repeat coronary evaluation but I think this could be accomplished by an outpatient CT coronary angiogram.  This would allow [place] to compare to her prior study in 2020.  From my standpoint I think she could be discharged today with a follow-up outpatient CT that we will arrange.  She will then see Dr. Cristal Deer or an APP in follow-up.  Time Spent Directly with Patient:  I have spent a total of 25 minutes with the patient reviewing hospital notes, telemetry, EKGs, labs and examining the patient as well as establishing an assessment and plan that was discussed personally with the patient.  > 50% of time was spent in direct patient care.  Length of Stay:  LOS: 0 days   Chrystie Nose, MD, [facility], FACP  [facility]  Medical Director of the [facility] Lipid [facility] &  Cardiovascular Risk Reduction Clinic Diplomate of the American Board of Clinical Lipidology Attending Cardiologist  Direct Dial: [phone]  Fax: [phone]  Website:  [email or web address]  Lisette Abu  Alesana Magistro 08/17/2021, 10:54 AM

## 2021-08-17 NOTE — Discharge Instructions (Signed)
Advised to follow-up with primary care physician in 1 week. Advised to follow-up with cardiology for outpatient coronary CT.

## 2021-08-17 NOTE — Discharge Summary (Signed)
Physician Discharge Summary  Samantha Molina ZOX:[id number] DOB: 15-Mar-1961 DOA: 08/16/2021  PCP: Deatra James, MD  Admit date: 08/16/2021  Discharge date: 08/17/2021  Admitted From: Home.  Disposition:  Home  Recommendations for Outpatient Follow-up:  Follow up with PCP in 1-2 weeks. Please obtain BMP/CBC in one week. Advised to follow-up with cardiology for outpatient coronary CT. Advised to take Imdur 30 mg daily.  Home Health: None Equipment/Devices:None  Discharge Condition: Stable CODE STATUS:Full code Diet recommendation: Heart Healthy  Brief [facility] Course: This 61 y.o. female with PMH significant of CAD, T2DM, HTN, migraine, anxiety and depression, chronic leg pain who presented to ED with complaints of chest pain. She got off work close to midnight last night and around 2 am she stared to have substernal chest pain that radiated to both her jaws and her left hand started to tingle. She laid down and started to rub her chest, but started to have shortness of breath.  She denies any associated N/V or dizziness. Pain was rated 7/10 and described as squeezing.  She got up and walked around, but nothing helped. She states the pain continued so she came to ED.  Father died of MI at 26 and her maternal grandfather died of MI >61 years of age Her mother also has CAD/MI history. Patient was admitted for chest pain to rule out ACS.  Patient was started on aspirin, Lipitor, metoprolol.  Cardiology was consulted, EKG normal sinus rhythm no acute changes. Trop x 3 negative. Chest pain has resolved.  Echocardiogram shows normal LVEF with mild to moderate mitral regurgitation.  Cardiology thinks the chest pain is noncardiac in origin.  Cardiology recommended outpatient CT coronary angiogram.  Patient feels better and cleared from cardiology to be discharged.  Patient being discharged home.   Discharge Diagnoses:  Principal Problem:   Chest pain with hx of nonocclusive CAD   Active Problems:   Nonocclusive coronary atherosclerosis of native coronary artery   Hypokalemia   Type 2 diabetes mellitus with other specified complication (HCC)   Essential hypertension   Mild major depression, single episode (HCC)   Chronic pain   Anxiety   Perimembranous ventricular septal defect   Migraine aura without headache    Discharge Instructions  Discharge Instructions     Call MD for:  difficulty breathing, headache or visual disturbances   Complete by: As directed    Call MD for:  persistant dizziness or light-headedness   Complete by: As directed    Call MD for:  persistant nausea and vomiting   Complete by: As directed    Diet - low sodium heart healthy   Complete by: As directed    Diet Carb Modified   Complete by: As directed    Discharge instructions   Complete by: As directed    Advised to follow-up with primary care physician in 1 week. Advised to follow-up with cardiology for outpatient coronary CT.   Increase activity slowly   Complete by: As directed       Allergies as of 08/17/2021   No Known Allergies      Medication List     TAKE these medications    acetaminophen 650 MG CR tablet Commonly known as: TYLENOL Take 650 mg by mouth every 8 (eight) hours as needed for pain.   ALPRAZolam 0.25 MG tablet Commonly known as: Xanax Take 0.5-1 tablets (0.125-0.25 mg total) by mouth daily as needed. What changed: reasons to take this   amLODipine 10 MG tablet  Commonly known as: NORVASC Take 1 tablet (10 mg total) by mouth daily.   aspirin 81 MG chewable tablet Chew 1 tablet (81 mg total) by mouth daily. Start taking on: Aug 18, 2021   atorvastatin 10 MG tablet Commonly known as: LIPITOR Take 1 tablet (10 mg total) by mouth daily.   cephALEXin 250 MG capsule Commonly known as: KEFLEX Take 1 capsule (250 mg total) by mouth daily.   clindamycin 300 MG capsule Commonly known as: CLEOCIN Take 2 capsules (600 mg total) by mouth as  directed 1 hour before procedure What changed: additional instructions   desvenlafaxine 50 MG 24 hr tablet Commonly known as: Pristiq Take 1 tablet (50 mg total) by mouth daily.   isosorbide mononitrate 30 MG 24 hr tablet Commonly known as: IMDUR Take 1 tablet (30 mg total) by mouth daily. Start taking on: Aug 18, 2021   lisinopril-hydrochlorothiazide 20-25 MG tablet Commonly known as: ZESTORETIC Take 1 tablet by mouth daily.   multivitamin with minerals tablet Take 1 tablet by mouth daily.   oxybutynin 10 MG 24 hr tablet Commonly known as: DITROPAN-XL TAKE 1 TABLET (10 MG TOTAL) BY MOUTH AT BEDTIME. What changed: how much to take   oxyCODONE-acetaminophen 10-325 MG tablet Commonly known as: Percocet Take 1 tablet by mouth every 8 hours as needed What changed:  how much to take how to take this when to take this reasons to take this   topiramate 100 MG tablet Commonly known as: Topamax Take 1 tablet (100 mg total) by mouth 2 (two) times daily.   Vitamin D 50 MCG (2000 UT) tablet Take 2,000 Units by mouth daily.        Follow-up Information     Deatra James, MD Follow up in 1 week(s).   Specialty: Family Medicine Contact information: [address] [place] [address] [phone]         Jodelle Red, MD .   Specialty: Cardiology Why: the office should call with date and time Contact information: [address] [place] [address] [phone]                No Known Allergies  Consultations: Cardiology   Procedures/Studies: DG Chest 2 View  Result Date: 08/16/2021 CLINICAL DATA:  Chest pain EXAM: CHEST - 2 VIEW COMPARISON:  07/15/2020 FINDINGS: Low volume chest. Normal heart size and mediastinal contours. There is no edema, consolidation, effusion, or pneumothorax. Lap band in place. IMPRESSION: Low volume chest without acute finding. Electronically Signed   By: Tiburcio Pea M.D.   On:  08/16/2021 06:55   ECHOCARDIOGRAM COMPLETE  Result Date: 08/16/2021    ECHOCARDIOGRAM REPORT   Patient Name:   Samantha Molina [facility] Date of Exam: 08/16/2021 Medical Rec #:  [id number]           Height:       66.0 in Accession #:    [id number]          Weight:       186.7 lb Date of Birth:  [birth year]           BSA:          1.942 m Patient Age:    61 years            BP:           128/90 mmHg Patient Gender: F                   HR:  60 bpm. Exam Location:  Inpatient Procedure: 2D Echo, Cardiac Doppler and Color Doppler Indications:    Chest pain  History:        Patient has prior history of Echocardiogram examinations, most                 recent 10/24/2019. CAD; Risk Factors:Hypertension and Diabetes.                 History of murmur and perimembranous VSD at birth.  Sonographer:    Ross LudwigArthur Guy RDCS (AE) Referring Phys: [id number] Orland MustardALLISON WOLFE IMPRESSIONS  1. Left ventricular ejection fraction, by estimation, is 60 to 65%. The left ventricle has normal function. The left ventricle has no regional wall motion abnormalities. There is mild left ventricular hypertrophy. Left ventricular diastolic parameters are consistent with Grade I diastolic dysfunction (impaired relaxation).  2. Right ventricular systolic function is normal. The right ventricular size is normal. There is normal pulmonary artery systolic pressure. The estimated right ventricular systolic pressure is 26.0 mmHg.  3. 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 Mild to moderate mitral valve regurgitation. There is moderate late systolic prolapse of multiple segments of the anterior leaflet of the mitral valve.  4. The tricuspid valve is myxomatous. There is mild tricuspid regurgitation.  5. The aortic valve is tricuspid. Aortic valve regurgitation is trivial.  6. The inferior vena cava is normal in size with greater than 50% respiratory variability, suggesting right atrial pressure of 3 mmHg.  7. No evidence for residual VSD Comparison(s): Changes from prior  study are noted. 10/24/2019: LVEF 60-65%. FINDINGS  Left Ventricle: Left ventricular ejection fraction, by estimation, is 60 to 65%. The left ventricle has normal function. The left ventricle has no regional wall motion abnormalities. The left ventricular internal cavity size was normal in size. There is  mild left ventricular hypertrophy. Left ventricular diastolic parameters are consistent with Grade I diastolic dysfunction (impaired relaxation). Normal left ventricular filling pressure. Right Ventricle: The right ventricular size is normal. No increase in right ventricular wall thickness. Right ventricular systolic function is normal. There is normal pulmonary artery systolic pressure. The tricuspid regurgitant velocity is 2.40 m/s, and  with an assumed right atrial pressure of 3 mmHg, the estimated right ventricular systolic pressure is 26.0 mmHg. Left Atrium: Left atrial size was normal in size. Right Atrium: Right atrial size was normal in size. Pericardium: There is no evidence of pericardial effusion. Mitral Valve: The mitral valve is myxomatous. There is moderate late systolic prolapse of multiple segments of the anterior leaflet of the mitral valve. There is moderate thickening of the anterior mitral valve leaflet(s). Mild to moderate mitral valve regurgitation, with posteriorly-directed jet. Tricuspid Valve: The tricuspid valve is myxomatous. Tricuspid valve regurgitation is mild. Aortic Valve: The aortic valve is tricuspid. Aortic valve regurgitation is trivial. Aortic valve mean gradient measures 6.0 mmHg. Aortic valve peak gradient measures 12.0 mmHg. Aortic valve area, by VTI measures 1.85 cm. Pulmonic Valve: The pulmonic valve was normal in structure. Pulmonic valve regurgitation is not visualized. Aorta: The aortic root and ascending aorta are structurally normal, with no evidence of dilitation. Venous: The inferior vena cava is normal in size with greater than 50% respiratory variability, suggesting  right atrial pressure of 3 mmHg. IAS/Shunts: No atrial level shunt detected by color flow Doppler.  LEFT VENTRICLE PLAX 2D LVIDd:         3.90 cm   Diastology LVIDs:         2.90 cm   LV  e' medial:    7.62 cm/s LV PW:         1.40 cm   LV E/e' medial:  10.3 LV IVS:        1.30 cm   LV e' lateral:   13.80 cm/s LVOT diam:     1.90 cm   LV E/e' lateral: 5.7 LV SV:         67 LV SV Index:   34 LVOT Area:     2.84 cm  RIGHT VENTRICLE RV Basal diam:  2.40 cm LEFT ATRIUM           Index        RIGHT ATRIUM           Index LA diam:      2.80 cm 1.44 cm/m   RA Area:     14.10 cm LA Vol (A2C): 23.9 ml 12.31 ml/m  RA Volume:   31.40 ml  16.17 ml/m LA Vol (A4C): 38.8 ml 19.98 ml/m  AORTIC VALVE AV Area (Vmax):    1.72 cm AV Area (Vmean):   1.76 cm AV Area (VTI):     1.85 cm AV Vmax:           173.00 cm/s AV Vmean:          113.000 cm/s AV VTI:            0.361 m AV Peak Grad:      12.0 mmHg AV Mean Grad:      6.0 mmHg LVOT Vmax:         105.00 cm/s LVOT Vmean:        70.200 cm/s LVOT VTI:          0.236 m LVOT/AV VTI ratio: 0.65  AORTA Ao Root diam: 2.80 cm Ao Asc diam:  3.30 cm MITRAL VALVE               TRICUSPID VALVE MV Area (PHT): 2.95 cm    TR Peak grad:   23.0 mmHg MV Decel Time: 257 msec    TR Vmax:        240.00 cm/s MV E velocity: 78.60 cm/s MV A velocity: 95.40 cm/s  SHUNTS MV E/A ratio:  0.82        Systemic VTI:  0.24 m                            Systemic Diam: 1.90 cm Zoila Shutter MD Electronically signed by Zoila Shutter MD Signature Date/Time: 08/16/2021/8:04:14 PM    Final    Echocardiogram.   Subjective: Patient was seen and examined at bedside.  Overnight events noted.  Patient reports chest pain has resolved.  She feels much improved and patient wants to be discharged.  Discharge Exam: Vitals:   08/17/21 0401 08/17/21 0852  BP: 99/65 115/71  Pulse: 66   Resp: 18   Temp: 98.4 F (36.9 C)   SpO2: 98%    Vitals:   08/16/21 2000 08/16/21 2341 08/17/21 0401 08/17/21 0852  BP: 115/64  (!) 103/59 99/65 115/71  Pulse:  77 66   Resp:  20 18   Temp:  98.2 F (36.8 C) 98.4 F (36.9 C)   TempSrc:  Oral Oral   SpO2:  99% 98%   Weight:      Height:        General: Pt is alert, awake, not in acute distress Cardiovascular: RRR, S1/S2 +, no rubs, no  gallops Respiratory: CTA bilaterally, no wheezing, no rhonchi Abdominal: Soft, NT, ND, bowel sounds + Extremities: no edema, no cyanosis    The results of significant diagnostics from this hospitalization (including imaging, microbiology, ancillary and laboratory) are listed below for reference.     Microbiology: No results found for this or any previous visit (from the past 240 hour(s)).   Labs: BNP (last 3 results) No results for input(s): BNP in the last 8760 hours. Basic Metabolic Panel: Recent Labs  Lab 08/16/21 0548 08/16/21 1436 08/17/21 0006  NA 140  --  143  K 3.2*  --  3.5  CL 104  --  110  CO2 27  --  27  GLUCOSE 104*  --  100*  BUN 24*  --  19  CREATININE 0.82  --  0.74  CALCIUM 9.9  --  9.7  MG  --  2.2  --    Liver Function Tests: No results for input(s): AST, ALT, ALKPHOS, BILITOT, PROT, ALBUMIN in the last 168 hours. No results for input(s): LIPASE, AMYLASE in the last 168 hours. No results for input(s): AMMONIA in the last 168 hours. CBC: Recent Labs  Lab 08/16/21 0548  WBC 10.4  NEUTROABS 6.7  HGB 12.2  HCT 39.3  MCV 73.5*  PLT 395   Cardiac Enzymes: No results for input(s): CKTOTAL, CKMB, CKMBINDEX, TROPONINI in the last 168 hours. BNP: Invalid input(s): POCBNP CBG: Recent Labs  Lab 08/16/21 2151 08/17/21 0824  GLUCAP 110* 101*   D-Dimer No results for input(s): DDIMER in the last 72 hours. Hgb A1c Recent Labs    08/17/21 0006  HGBA1C 6.1*   Lipid Profile Recent Labs    08/17/21 0006  CHOL 122  HDL 38*  LDLCALC 63  TRIG 742  CHOLHDL 3.2   Thyroid function studies No results for input(s): TSH, T4TOTAL, T3FREE, THYROIDAB in the last 72 hours.  Invalid  input(s): FREET3 Anemia work up Recent Labs    08/16/21 1553  VITAMINB12 227   Urinalysis    Component Value Date/Time   COLORURINE YELLOW 07/06/2016 1237   APPEARANCEUR HAZY (A) 07/06/2016 1237   LABSPEC 1.015 07/06/2016 1237   PHURINE 6.0 07/06/2016 1237   GLUCOSEU NEGATIVE 07/06/2016 1237   HGBUR LARGE (A) 07/06/2016 1237   BILIRUBINUR NEGATIVE 07/06/2016 1237   KETONESUR 5 (A) 07/06/2016 1237   PROTEINUR 30 (A) 07/06/2016 1237   UROBILINOGEN 1.0 01/18/2010 1310   NITRITE NEGATIVE 07/06/2016 1237   LEUKOCYTESUR MODERATE (A) 07/06/2016 1237   Sepsis Labs Invalid input(s): PROCALCITONIN,  WBC,  LACTICIDVEN Microbiology No results found for this or any previous visit (from the past 240 hour(s)).   Time coordinating discharge: Over 30 minutes  SIGNED:   Cipriano Bunker, MD  [facility] 08/17/2021, 5:00 PM Pager   If 7PM-7AM, please contact night-coverage

## 2021-08-18 ENCOUNTER — Other Ambulatory Visit (HOSPITAL_COMMUNITY): Payer: Self-pay | Admitting: Emergency Medicine

## 2021-08-18 ENCOUNTER — Encounter (HOSPITAL_COMMUNITY): Payer: Self-pay

## 2021-08-18 DIAGNOSIS — I208 Other forms of angina pectoris: Secondary | ICD-10-CM

## 2021-08-18 MED ORDER — METOPROLOL TARTRATE 100 MG PO TABS
100.0000 mg | ORAL_TABLET | Freq: Once | ORAL | 0 refills | Status: DC
  Filled 2021-08-18: qty 1, 1d supply, fill #0

## 2021-08-19 ENCOUNTER — Other Ambulatory Visit (HOSPITAL_COMMUNITY): Payer: Self-pay

## 2021-08-21 ENCOUNTER — Ambulatory Visit (INDEPENDENT_AMBULATORY_CARE_PROVIDER_SITE_OTHER): Payer: 59 | Admitting: Obstetrics and Gynecology

## 2021-08-21 ENCOUNTER — Other Ambulatory Visit (HOSPITAL_COMMUNITY): Payer: Self-pay

## 2021-08-21 ENCOUNTER — Encounter: Payer: Self-pay | Admitting: Obstetrics and Gynecology

## 2021-08-21 VITALS — BP 118/74 | HR 97 | Ht 63.5 in | Wt 187.0 lb

## 2021-08-21 DIAGNOSIS — Z01419 Encounter for gynecological examination (general) (routine) without abnormal findings: Secondary | ICD-10-CM | POA: Diagnosis not present

## 2021-08-21 DIAGNOSIS — N3281 Overactive bladder: Secondary | ICD-10-CM | POA: Diagnosis not present

## 2021-08-21 DIAGNOSIS — H5213 Myopia, bilateral: Secondary | ICD-10-CM | POA: Diagnosis not present

## 2021-08-21 MED ORDER — OXYBUTYNIN CHLORIDE ER 10 MG PO TB24
10.0000 mg | ORAL_TABLET | Freq: Every day | ORAL | 3 refills | Status: AC
  Filled 2021-08-21: qty 90, 90d supply, fill #0
  Filled 2022-02-03: qty 90, 90d supply, fill #1
  Filled 2022-05-02: qty 90, 90d supply, fill #2

## 2021-08-21 NOTE — Patient Instructions (Signed)

## 2021-08-25 ENCOUNTER — Other Ambulatory Visit (HOSPITAL_COMMUNITY): Payer: Self-pay

## 2021-08-27 DIAGNOSIS — I2510 Atherosclerotic heart disease of native coronary artery without angina pectoris: Secondary | ICD-10-CM | POA: Diagnosis not present

## 2021-08-27 DIAGNOSIS — R079 Chest pain, unspecified: Secondary | ICD-10-CM | POA: Diagnosis not present

## 2021-08-27 DIAGNOSIS — I10 Essential (primary) hypertension: Secondary | ICD-10-CM | POA: Diagnosis not present

## 2021-09-02 ENCOUNTER — Ambulatory Visit: Payer: 59

## 2021-09-02 DIAGNOSIS — M779 Enthesopathy, unspecified: Secondary | ICD-10-CM

## 2021-09-02 DIAGNOSIS — M2170 Unequal limb length (acquired), unspecified site: Secondary | ICD-10-CM

## 2021-09-02 NOTE — Progress Notes (Signed)
SITUATION: Reason for Visit: Fitting and Delivery of Custom Fabricated Foot Orthoses Patient Report: Patient reports comfort and is satisfied with device.  OBJECTIVE DATA: Patient History / Diagnosis:     ICD-10-CM   1. Acquired unequal limb length  M21.70     2. Tendonitis  M77.9       Provided Device:  Custom Functional Foot Orthotics     [facility]: [id number] - [id number]  GOAL OF ORTHOSIS - Improve gait - Decrease energy expenditure - Improve Balance - Provide Triplanar stability of foot complex - Facilitate motion  ACTIONS PERFORMED Patient was fit with foot orthotics trimmed to shoe last. Patient tolerated fittign procedure.   Patient was provided with verbal and written instruction and demonstration regarding donning, doffing, wear, care, proper fit, function, purpose, cleaning, and use of the orthosis and in all related precautions and risks and benefits regarding the orthosis.  Patient was also provided with verbal instruction regarding how to report any failures or malfunctions of the orthosis and necessary follow up care. Patient was also instructed to contact our office regarding any change in status that may affect the function of the orthosis.  Patient demonstrated independence with proper donning, doffing, and fit and verbalized understanding of all instructions.  PLAN: Patient is to follow up in one week or as necessary (PRN). All questions were answered and concerns addressed. Plan of care was discussed with and agreed upon by the patient.

## 2021-09-04 ENCOUNTER — Other Ambulatory Visit (HOSPITAL_COMMUNITY): Payer: Self-pay

## 2021-09-04 DIAGNOSIS — Z96652 Presence of left artificial knee joint: Secondary | ICD-10-CM | POA: Diagnosis not present

## 2021-09-04 DIAGNOSIS — Z471 Aftercare following joint replacement surgery: Secondary | ICD-10-CM | POA: Diagnosis not present

## 2021-09-04 MED ORDER — DESVENLAFAXINE SUCCINATE ER 50 MG PO TB24
50.0000 mg | ORAL_TABLET | Freq: Every day | ORAL | 0 refills | Status: DC
  Filled 2021-09-04: qty 90, 90d supply, fill #0

## 2021-09-04 MED ORDER — LISINOPRIL-HYDROCHLOROTHIAZIDE 20-25 MG PO TABS
1.0000 | ORAL_TABLET | Freq: Every day | ORAL | 0 refills | Status: DC
  Filled 2021-09-04: qty 90, 90d supply, fill #0

## 2021-09-05 ENCOUNTER — Telehealth (HOSPITAL_COMMUNITY): Payer: Self-pay | Admitting: *Deleted

## 2021-09-05 NOTE — Telephone Encounter (Signed)
Reaching out to patient to offer assistance regarding upcoming cardiac imaging study; pt verbalizes understanding of appt date/time, parking situation and where to check in, pre-test NPO status and medications ordered, and verified current allergies; name and call back number provided for further questions should they arise  Gerry Heaphy RN Navigator Cardiac Imaging [facility] [phone] office [phone] cell  Patient to take 100mg metoprolol tartrate two hours prior to her cardiac CT scan.  She is aware to arrive at 11am. 

## 2021-09-05 NOTE — Telephone Encounter (Signed)
Attempted to call patient regarding upcoming cardiac CT appointment. °Left message on voicemail with name and callback number ° °Dayona Shaheen RN Navigator Cardiac Imaging °[facility] °[phone] Office °[phone] Cell ° °

## 2021-09-08 ENCOUNTER — Other Ambulatory Visit (HOSPITAL_COMMUNITY): Payer: Self-pay

## 2021-09-08 ENCOUNTER — Ambulatory Visit (HOSPITAL_COMMUNITY)
Admission: RE | Admit: 2021-09-08 | Discharge: 2021-09-08 | Disposition: A | Discharge status: Home / Self Care | Payer: 59 | Source: Ambulatory Visit | Attending: Cardiology | Admitting: Cardiology

## 2021-09-08 DIAGNOSIS — I208 Other forms of angina pectoris: Secondary | ICD-10-CM | POA: Diagnosis not present

## 2021-09-08 DIAGNOSIS — I25118 Atherosclerotic heart disease of native coronary artery with other forms of angina pectoris: Secondary | ICD-10-CM

## 2021-09-08 MED ORDER — NITROGLYCERIN 0.4 MG SL SUBL
0.8000 mg | SUBLINGUAL_TABLET | Freq: Once | SUBLINGUAL | Status: AC
  Administered 2021-09-08: 0.8 mg via SUBLINGUAL

## 2021-09-08 MED ORDER — NITROGLYCERIN 0.4 MG SL SUBL
SUBLINGUAL_TABLET | SUBLINGUAL | Status: AC
  Filled 2021-09-08: qty 2

## 2021-09-08 MED ORDER — OXYCODONE-ACETAMINOPHEN 10-325 MG PO TABS
ORAL_TABLET | ORAL | 0 refills | Status: DC
Start: 2021-09-08 — End: 2021-10-29
  Filled 2021-09-08: qty 90, 30d supply, fill #0

## 2021-09-08 MED ORDER — IOHEXOL 350 MG/ML SOLN
100.0000 mL | Freq: Once | INTRAVENOUS | Status: AC | PRN
  Administered 2021-09-08: 100 mL via INTRAVENOUS

## 2021-09-08 NOTE — Progress Notes (Signed)
Pt was scanned "retro" after getting verbal confirmation from Dr. Rennis Golden due to heart rate ranging from 40s-60s.

## 2021-09-09 ENCOUNTER — Ambulatory Visit (INDEPENDENT_AMBULATORY_CARE_PROVIDER_SITE_OTHER): Payer: 59 | Admitting: Family

## 2021-09-09 ENCOUNTER — Other Ambulatory Visit (HOSPITAL_COMMUNITY): Payer: Self-pay

## 2021-09-09 ENCOUNTER — Encounter (HOSPITAL_BASED_OUTPATIENT_CLINIC_OR_DEPARTMENT_OTHER): Payer: Self-pay | Admitting: Family

## 2021-09-09 ENCOUNTER — Encounter (HOSPITAL_BASED_OUTPATIENT_CLINIC_OR_DEPARTMENT_OTHER): Payer: Self-pay

## 2021-09-09 VITALS — BP 122/74 | HR 81 | Ht 63.5 in | Wt 190.4 lb

## 2021-09-09 DIAGNOSIS — I341 Nonrheumatic mitral (valve) prolapse: Secondary | ICD-10-CM

## 2021-09-09 DIAGNOSIS — E785 Hyperlipidemia, unspecified: Secondary | ICD-10-CM | POA: Diagnosis not present

## 2021-09-09 DIAGNOSIS — I25118 Atherosclerotic heart disease of native coronary artery with other forms of angina pectoris: Secondary | ICD-10-CM | POA: Diagnosis not present

## 2021-09-09 MED ORDER — METOPROLOL SUCCINATE ER 25 MG PO TB24
25.0000 mg | ORAL_TABLET | Freq: Every day | ORAL | 1 refills | Status: DC
  Filled 2021-09-09: qty 90, 90d supply, fill #0

## 2021-09-09 NOTE — Patient Instructions (Addendum)
Medication Instructions:  Your physician has recommended you make the following change in your medication:   STOP Isosorbide Mononitrate  START Metoprolol succinate 25mg  daily    *If you need a refill on your cardiac medications before your next appointment, please call your pharmacy*  Lab Work: None ordered today.   Testing/Procedures: Your cardiac CT showed minimal to mild nonobstructive heart disease.   Your echocardiogram showed normal heart pumping function but also moderate prolapse of the mitral valve. We will repeat an echo in 1 year for monitoring. Your physician has requested that you have an echocardiogram in 1 year. Echocardiography is a painless test that uses sound waves to create images of your heart. It provides your doctor with information about the size and shape of your heart and how well your heart's chambers and valves are working. This procedure takes approximately one hour. There are no restrictions for this procedure.   Follow-Up: At [facility], you and your health needs are our priority.  As part of our continuing mission to provide you with exceptional heart care, we have created designated Provider Care Teams.  These Care Teams include your primary Cardiologist (physician) and Advanced Practice Providers (APPs -  Physician Assistants and Nurse Practitioners) who all work together to provide you with the care you need, when you need it.  We recommend signing up for the patient portal called "MyChart".  Sign up information is provided on this After Visit Summary.  MyChart is used to connect with patients for Virtual Visits (Telemedicine).  Patients are able to view lab/test results, encounter notes, upcoming appointments, etc.  Non-urgent messages can be sent to your provider as well.   To learn more about what you can do with MyChart, go to [facility].    Your next appointment:   2-3 month(s)  The format for your next appointment:   In  Person  Provider:   [email or web address], MD or Jodelle Red, NP    Other Instructions  Heart Healthy Diet Recommendations: A low-salt diet is recommended. Meats should be grilled, baked, or boiled. Avoid fried foods. Focus on lean protein sources like fish or chicken with vegetables and fruits. The American Heart Association is a Alver Sorrow!  American Heart Association Diet and Lifeystyle Recommendations   Exercise recommendations: The American Heart Association recommends 150 minutes of moderate intensity exercise weekly. Try 30 minutes of moderate intensity exercise 4-5 times per week. This could include walking, jogging, or swimming.  Pam or Lora from [employer]) will be in touch with you   Important Information About Sugar

## 2021-09-09 NOTE — Progress Notes (Signed)
Office Visit    Patient Name: Samantha Molina Date of Encounter: 09/11/2021  PCP:  Deatra James, MD   [facility]  Cardiologist:  Jodelle Red, MD  Advanced Practice Provider:  No care team member to display Electrophysiologist:  None      Chief Complaint    Samantha Molina is a 61 y.o. female presents today for follow up after cardiac CT.    Past Medical History    Past Medical History:  Diagnosis Date   Arthritis    Arthritis    both knees   Chronic leg pain    Coronary artery disease    Depression    Diabetes mellitus without complication (HCC)    Heart murmur    as a child   Hypertension    Migraine without aura    Morbid obesity (HCC)    Urinary incontinence    Vitamin D deficiency disease 2013   Past Surgical History:  Procedure Laterality Date   JOINT REPLACEMENT Left 2022   Knee   KNEE ARTHROSCOPY W/ ACL RECONSTRUCTION Left 2004   LAPAROSCOPIC CHOLECYSTECTOMY  2005   LAPAROSCOPIC GASTRIC BANDING  2007   TONSILLECTOMY  1984   Age 60   TOTAL VAGINAL HYSTERECTOMY  age 89   Ovaries remain, for menorrhagia   TUBAL LIGATION      Allergies  Allergies  Allergen Reactions   Isosorbide Mononitrate [Isosorbide Nitrate] Other (See Comments)    Headache    History of Present Illness    Samantha Molina is a 61 y.o. female with a hx of CAD, MVP, migraine, HTN, depression/anxiety s/p lab-band 2007  last seen while hospitalized. Family history notable for coronary disease.   Last seen in clinic by Dr. Cristal Deer 07/2020 doing well from cardiac perspective.  Admitted 08/16/21 with chest pain. Echo LVEF 60-65%, no RWMA, mild LVH, gr1DD, RV normal size/function, mild to moderate MR, moderate late systolic prolapse of multiple segments of anterior leaflet of MV, mild TR, trivial AI. Cardiac CT with coronary calcium score 183 placing patient in 94th percentile. Minimal to mild nonobstructive coronary disease. Chest pain  deemed non cardiac. Secondary prevention recommended.  Presents today for follow up. Has had headache while taking Imdur since discharge. We will discontinue. Still with a "twinge" in right chest. Reviewed cardiac testing in detail. Very active with Zumba and the gym. Recently had knee surgery and was given clearance to go back to exercise such as stairmaster.   EKGs/Labs/Other Studies Reviewed:   The following studies were reviewed today: Echo 07/2021   1. Left ventricular ejection fraction, by estimation, is 60 to 65%. The  left ventricle has normal function. The left ventricle has no regional  wall motion abnormalities. There is mild left ventricular hypertrophy.  Left ventricular diastolic parameters  are consistent with Grade I diastolic dysfunction (impaired relaxation).   2. Right ventricular systolic function is normal. The right ventricular  size is normal. There is normal pulmonary artery systolic pressure. The  estimated right ventricular systolic pressure is 26.0 mmHg.   3. 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 Mild to moderate mitral valve  regurgitation. There is moderate late systolic prolapse of multiple  segments of the anterior leaflet of the mitral valve.   4. The tricuspid valve is myxomatous. There is mild tricuspid  regurgitation.   5. The aortic valve is tricuspid. Aortic valve regurgitation is trivial.   6. The inferior vena cava is normal in size with greater than 50%  respiratory variability, suggesting right atrial pressure of 3 mmHg.   7. No evidence for residual VSD   Comparison(s): Changes from prior study are noted. 10/24/2019: LVEF 60-65%.   Cardiac CTA 07/2021 FINDINGS: Quality: Fair, attenuation artifact, HR 54, scanned retrospective due to significant heart rate variability   Coronary calcium score: The patient's coronary artery calcium score is 183, which places the patient in the 94th percentile.   Coronary arteries: Normal coronary origins.  Right  dominance.   Right Coronary Artery: Dominant.  Normal vessel.   Left Main Coronary Artery: Normal. Bifurcates into the LAD and LCX arteries.   Left Anterior Descending Coronary Artery: Large anterior artery that wraps around the apex. There is mild proximal 24-49% mixed stenosis (CADRADS2). D1 branch has minimal 1-24% mixed ostial stenosis (CADRADS1).   Left Circumflex Artery: AV groove vessel, no significant disease.   Aorta: Normal size, 33 mm at the mid ascending aorta (level of the PA bifurcation) measured double oblique. No calcifications. No dissection.   Aortic Valve: Trileaflet. No calcifications.   Other findings:   Normal pulmonary vein drainage into the left atrium.   Normal left atrial appendage without a thrombus.   Normal size of the pulmonary artery.   Gastric lap-band noted   Aneurysmal membranous septum without obvious VSD.   IMPRESSION: 1. Minimal to mild non-obstructive CAD, CADRADS = 2.   2. Coronary calcium score of 183. This was 94th percentile for age and sex matched control.   3. Normal coronary origin with right dominance.   4. Aggressive cardiovascular risk factor modification recommended   5. Aneurysmal membranous septum without obvious VSD.   6. Consider non-coronary causes of chest pain.  EKG:  No EKG today.  Recent Labs: 08/16/2021: Hemoglobin 12.2; Magnesium 2.2; Platelets 395 08/17/2021: BUN 19; Creatinine, Ser 0.74; Potassium 3.5; Sodium 143  Recent Lipid Panel    Component Value Date/Time   CHOL 122 08/17/2021 0006   TRIG 105 08/17/2021 0006   HDL 38 (L) 08/17/2021 0006   CHOLHDL 3.2 08/17/2021 0006   VLDL 21 08/17/2021 0006   LDLCALC 63 08/17/2021 0006     Home Medications   Current Meds  Medication Sig   acetaminophen (TYLENOL) 650 MG CR tablet Take 650 mg by mouth every 8 (eight) hours as needed for pain.   ALPRAZolam (XANAX) 0.25 MG tablet Take 0.5-1 tablets (0.125-0.25 mg total) by mouth daily as needed. (Patient  taking differently: Take 0.125-0.25 mg by mouth daily as needed for anxiety.)   amLODipine (NORVASC) 10 MG tablet Take 1 tablet (10 mg total) by mouth daily.   aspirin 81 MG chewable tablet Chew 1 tablet (81 mg total) by mouth daily.   atorvastatin (LIPITOR) 10 MG tablet Take 1 tablet (10 mg total) by mouth daily.   cephALEXin (KEFLEX) 250 MG capsule Take 1 capsule (250 mg total) by mouth daily.   Cholecalciferol (VITAMIN D) 2000 UNITS tablet Take 2,000 Units by mouth daily.   desvenlafaxine (PRISTIQ) 50 MG 24 hr tablet Take 1 tablet (50 mg total) by mouth daily.   lisinopril-hydrochlorothiazide (ZESTORETIC) 20-25 MG tablet Take 1 tablet by mouth daily.   metoprolol succinate (TOPROL XL) 25 MG 24 hr tablet Take 1 tablet (25 mg total) by mouth daily.   Multiple Vitamins-Minerals (MULTIVITAMIN WITH MINERALS) tablet Take 1 tablet by mouth daily.   oxybutynin (DITROPAN-XL) 10 MG 24 hr tablet Take 1 tablet (10 mg total) by mouth at bedtime.   oxyCODONE-acetaminophen (PERCOCET) 10-325 MG tablet Take 1 tablet by  mouth every 8 hours as needed for 30 days.   topiramate (TOPAMAX) 100 MG tablet Take 1 tablet (100 mg total) by mouth 2 (two) times daily.   [DISCONTINUED] isosorbide mononitrate (IMDUR) 30 MG 24 hr tablet Take 1 tablet (30 mg total) by mouth daily.   [DISCONTINUED] metoprolol tartrate (LOPRESSOR) 100 MG tablet Take 1 tablet (100 mg total) by mouth once for 1 dose. Please take one time dose 100mg  metoprolol tartrate 2 hr prior to cardiac CT for HR control IF HR >55bpm.     Review of Systems      All other systems reviewed and are otherwise negative except as noted above.  Physical Exam    VS:  BP 122/74 (BP Location: Left Arm, Patient Position: Sitting, Cuff Size: Large)   Pulse 81   Ht 5' 3.5" (1.613 m)   Wt 190 lb 6.4 oz (86.4 kg)   SpO2 99%   BMI 33.20 kg/m  , BMI Body mass index is 33.2 kg/m.  Wt Readings from Last 3 Encounters:  09/09/21 190 lb 6.4 oz (86.4 kg)  08/21/21 187  lb (84.8 kg)  08/16/21 186 lb 11.2 oz (84.7 kg)     GEN: Well nourished, well developed, in no acute distress. HEENT: normal. Neck: Supple, no JVD, carotid bruits, or masses. Cardiac: RRR, no murmurs, rubs, or gallops. No clubbing, cyanosis, edema.  Radials/PT 2+ and equal bilaterally.  Respiratory:  Respirations regular and unlabored, clear to auscultation bilaterally. GI: Soft, nontender, nondistended. MS: No deformity or atrophy. Skin: Warm and dry, no rash. Neuro:  Strength and sensation are intact. Psych: Normal affect.  Assessment & Plan    CAD - Minimal to mild nonobstructive on CTA 07/2021. Right sided "twinge" likely non cardiac. Reassurance provided. Heart healthy diet and regular cardiovascular exercise encouraged.  Did not tolerate Imdur due to headache, will discontinue. Continue Aspirin, Atorvastatin. Start Toprol 25mg  QD for optimization of GDMT.  MVP - Repeat echo in 1 year for monitoring.   HLD, LDL goal <70 - Follows a vegan diet since 2019. 08/17/21 LDL 63. Presently on Atorvastatin 10mg  QD.      Disposition: Follow up  in 2-3 mos  with Buford Dresser, MD or APP.  Signed, Loel Dubonnet, NP 09/11/2021, 9:32 PM [place]

## 2021-09-11 ENCOUNTER — Encounter (HOSPITAL_BASED_OUTPATIENT_CLINIC_OR_DEPARTMENT_OTHER): Payer: Self-pay | Admitting: Family

## 2021-09-12 ENCOUNTER — Ambulatory Visit (INDEPENDENT_AMBULATORY_CARE_PROVIDER_SITE_OTHER): Payer: 59 | Admitting: Psychology

## 2021-09-12 ENCOUNTER — Telehealth: Payer: Self-pay

## 2021-09-12 DIAGNOSIS — F331 Major depressive disorder, recurrent, moderate: Secondary | ICD-10-CM

## 2021-09-24 ENCOUNTER — Telehealth: Payer: Self-pay

## 2021-09-24 NOTE — Telephone Encounter (Signed)
Called to discuss PREP class schedule at [facility], she wants to attend the July 31 class, every M/W 8:30-9:15.

## 2021-09-26 ENCOUNTER — Other Ambulatory Visit (HOSPITAL_COMMUNITY): Payer: Self-pay

## 2021-09-30 ENCOUNTER — Ambulatory Visit (INDEPENDENT_AMBULATORY_CARE_PROVIDER_SITE_OTHER): Payer: 59 | Admitting: Psychology

## 2021-09-30 DIAGNOSIS — F331 Major depressive disorder, recurrent, moderate: Secondary | ICD-10-CM

## 2021-09-30 NOTE — Progress Notes (Signed)
[facility] Counselor/Therapist Progress Note  Patient ID: Samantha Molina, MRN: [id number],    Date: 09/30/2021  Time Spent: 12:00pm-12:50pm    50 minutes   Treatment Type: Individual Therapy  Reported Symptoms: sadness, stress  Mental Status Exam: Appearance:  Casual     Behavior: Appropriate  Motor: Normal  Speech/Language:  Normal Rate  Affect: Appropriate  Mood: normal  Thought process: normal  Thought content:   WNL  Sensory/Perceptual disturbances:   WNL  Orientation: oriented to person, place, time/date, and situation  Attention: Good  Concentration: Good  Memory: WNL  Fund of knowledge:  Good  Insight:   Good  Judgment:  Good  Impulse Control: Good   Risk Assessment: Danger to Self:  No Self-injurious Behavior: No Danger to Others: No Duty to Warn:no Physical Aggression / Violence:No  Access to Firearms a concern: No  Gang Involvement:No   Subjective: Pt Samantha Molina present for face-to-face individual therapy via video Webex.  Pt consents to telehealth video session due to COVID 19 pandemic. Location of pt: home Location of therapist: home office.   Pt talked about work.  Pt had a meltdown at work yesterday.   Pt had an incident with her boss.  Pt was upset that her boss fired a Psychologist, sport and exercise.  He nurse tech was sick so pt feels she should not have been fired.  Her boss is not going to give her a part time job so she plans to go ahead and retire when she turns 62 next year.   There has been a lot of stress at work.  Worked on [employer], dispensing.  Pt talked about issues with her mother.   Addressed their interactions.   Helped pt process her feelings and relationship dynamics.  Pt states she and her husband have been getting along better and plan to have a vacation at home in a couple of weeks.   Worked on self care strategies.   Pt is taking walks and reading her Bible and journaling.  Provided supportive therapy.    Interventions: Cognitive  Behavioral Therapy and Insight-Oriented  Diagnosis: F33.1  Plan: See pt's Treatment Plan for depression in Therapy Charts.  (Treatment Plan Target Date: 02/17/2022) Pt is progressing toward treatment goals.   Plan to continue to see pt every two weeks.    Jenika Chiem, LCSW

## 2021-10-06 ENCOUNTER — Other Ambulatory Visit (HOSPITAL_COMMUNITY): Payer: Self-pay

## 2021-10-06 MED ORDER — OXYCODONE-ACETAMINOPHEN 10-325 MG PO TABS
ORAL_TABLET | ORAL | 0 refills | Status: DC
Start: 2021-10-07 — End: 2021-11-04
  Filled 2021-10-07: qty 90, 30d supply, fill #0

## 2021-10-07 ENCOUNTER — Other Ambulatory Visit (HOSPITAL_COMMUNITY): Payer: Self-pay

## 2021-10-08 ENCOUNTER — Other Ambulatory Visit (HOSPITAL_COMMUNITY): Payer: Self-pay

## 2021-10-13 NOTE — Progress Notes (Signed)
[facility] PREP Evaluation  Patient Details  Name: Samantha Molina MRN: [id number] Date of Birth: 02-06-1961 Age: 61 y.o. PCP: Deatra James, MD  Vitals:   10/13/21 0943  BP: (!) 92/52  Pulse: 64  SpO2: 98%  Weight: 185 lb 12.8 oz (84.3 kg)     Past Medical History:  Diagnosis Date   Arthritis    Arthritis    both knees   Chronic leg pain    Coronary artery disease    Depression    Diabetes mellitus without complication (HCC)    Heart murmur    as a child   Hypertension    Migraine without aura    Morbid obesity (HCC)    Urinary incontinence    Vitamin D deficiency disease 2013   Past Surgical History:  Procedure Laterality Date   JOINT REPLACEMENT Left 2022   Knee   KNEE ARTHROSCOPY W/ ACL RECONSTRUCTION Left 2004   LAPAROSCOPIC CHOLECYSTECTOMY  2005   LAPAROSCOPIC GASTRIC BANDING  2007   TONSILLECTOMY  1984   Age 82   TOTAL VAGINAL HYSTERECTOMY  age 56   Ovaries remain, for menorrhagia   TUBAL LIGATION     Social History   Tobacco Use  Smoking Status Never  Smokeless Tobacco Never  To begin PREP class at [facility] July 31, every M/W 8:30-9:45  Sonia Baller 10/13/2021, 9:55 AM

## 2021-10-20 ENCOUNTER — Encounter: Payer: Self-pay | Admitting: *Deleted

## 2021-10-20 NOTE — Progress Notes (Signed)
[facility] PREP Weekly Session  Patient Details  Name: Samantha Molina MRN: [id number] Date of Birth: November 15, 1960 Age: 61 y.o. PCP: Deatra James, MD  There were no vitals filed for this visit.   [facility] Weekly seesion - 10/20/21 0900       [facility] "PREP" Location   [facility] "PREP" Location [facility]      Weekly Session   Topic Discussed Goal setting and welcome to the program   Tour of facility, review of workbook, opportunity to try out cardio machine.            [name] 10/20/2021, 9:49 AM

## 2021-10-25 ENCOUNTER — Other Ambulatory Visit (HOSPITAL_COMMUNITY): Payer: Self-pay

## 2021-10-27 ENCOUNTER — Other Ambulatory Visit: Payer: Self-pay

## 2021-10-27 ENCOUNTER — Other Ambulatory Visit (HOSPITAL_COMMUNITY): Payer: Self-pay

## 2021-10-27 ENCOUNTER — Emergency Department (HOSPITAL_COMMUNITY): Payer: 59

## 2021-10-27 ENCOUNTER — Observation Stay (HOSPITAL_COMMUNITY)
Admission: EM | Admit: 2021-10-27 | Discharge: 2021-10-29 | Disposition: A | Discharge status: Home / Self Care | Payer: 59 | Attending: Internal Medicine | Admitting: Internal Medicine

## 2021-10-27 DIAGNOSIS — R079 Chest pain, unspecified: Secondary | ICD-10-CM | POA: Insufficient documentation

## 2021-10-27 DIAGNOSIS — I10 Essential (primary) hypertension: Secondary | ICD-10-CM | POA: Insufficient documentation

## 2021-10-27 DIAGNOSIS — Z20822 Contact with and (suspected) exposure to covid-19: Secondary | ICD-10-CM | POA: Diagnosis not present

## 2021-10-27 DIAGNOSIS — Z7982 Long term (current) use of aspirin: Secondary | ICD-10-CM | POA: Diagnosis not present

## 2021-10-27 DIAGNOSIS — E876 Hypokalemia: Secondary | ICD-10-CM | POA: Insufficient documentation

## 2021-10-27 DIAGNOSIS — I959 Hypotension, unspecified: Secondary | ICD-10-CM | POA: Diagnosis not present

## 2021-10-27 DIAGNOSIS — Z79899 Other long term (current) drug therapy: Secondary | ICD-10-CM | POA: Insufficient documentation

## 2021-10-27 DIAGNOSIS — R55 Syncope and collapse: Secondary | ICD-10-CM | POA: Diagnosis not present

## 2021-10-27 DIAGNOSIS — J9811 Atelectasis: Secondary | ICD-10-CM | POA: Diagnosis not present

## 2021-10-27 LAB — BASIC METABOLIC PANEL
Anion gap: 8 (ref 5–15)
BUN: 21 mg/dL (ref 8–23)
CO2: 25 mmol/L (ref 22–32)
Calcium: 9.7 mg/dL (ref 8.9–10.3)
Chloride: 106 mmol/L (ref 98–111)
Creatinine, Ser: 1.09 mg/dL — ABNORMAL HIGH (ref 0.44–1.00)
GFR, Estimated: 58 mL/min — ABNORMAL LOW (ref >60)
Glucose, Bld: 92 mg/dL (ref 70–99)
Potassium: 2.9 mmol/L — ABNORMAL LOW (ref 3.5–5.1)
Sodium: 139 mmol/L (ref 135–145)

## 2021-10-27 LAB — CBC WITH DIFFERENTIAL/PLATELET
Abs Immature Granulocytes: 0.04 10*3/uL (ref 0.00–0.07)
Basophils Absolute: 0 10*3/uL (ref 0.0–0.1)
Basophils Relative: 1 %
Eosinophils Absolute: 0.1 10*3/uL (ref 0.0–0.5)
Eosinophils Relative: 2 %
HCT: 38.6 % (ref 36.0–46.0)
Hemoglobin: 11.9 g/dL — ABNORMAL LOW (ref 12.0–15.0)
Immature Granulocytes: 1 %
Lymphocytes Relative: 33 %
Lymphs Abs: 2.2 10*3/uL (ref 0.7–4.0)
MCH: 23.7 pg — ABNORMAL LOW (ref 26.0–34.0)
MCHC: 30.8 g/dL (ref 30.0–36.0)
MCV: 76.7 fL — ABNORMAL LOW (ref 80.0–100.0)
Monocytes Absolute: 0.6 10*3/uL (ref 0.1–1.0)
Monocytes Relative: 8 %
Neutro Abs: 3.7 10*3/uL (ref 1.7–7.7)
Neutrophils Relative %: 55 %
Platelets: 429 10*3/uL — ABNORMAL HIGH (ref 150–400)
RBC: 5.03 MIL/uL (ref 3.87–5.11)
RDW: 15.7 % — ABNORMAL HIGH (ref 11.5–15.5)
WBC: 6.6 10*3/uL (ref 4.0–10.5)
nRBC: 0 % (ref 0.0–0.2)

## 2021-10-27 LAB — TROPONIN I (HIGH SENSITIVITY)
Troponin I (High Sensitivity): 4 ng/L (ref <18)
Troponin I (High Sensitivity): 4 ng/L (ref <18)

## 2021-10-27 LAB — SARS CORONAVIRUS 2 BY RT PCR: SARS Coronavirus 2 by RT PCR: NEGATIVE

## 2021-10-27 LAB — D-DIMER, QUANTITATIVE: D-Dimer, Quant: 0.91 ug/mL-FEU — ABNORMAL HIGH (ref 0.00–0.50)

## 2021-10-27 LAB — CBG MONITORING, ED: Glucose-Capillary: 104 mg/dL — ABNORMAL HIGH (ref 70–99)

## 2021-10-27 LAB — MAGNESIUM: Magnesium: 2 mg/dL (ref 1.7–2.4)

## 2021-10-27 MED ORDER — IOHEXOL 350 MG/ML SOLN
100.0000 mL | Freq: Once | INTRAVENOUS | Status: AC | PRN
  Administered 2021-10-27: 100 mL via INTRAVENOUS

## 2021-10-27 MED ORDER — SODIUM CHLORIDE 0.9 % IV BOLUS
1000.0000 mL | Freq: Once | INTRAVENOUS | Status: AC
  Administered 2021-10-27: 1000 mL via INTRAVENOUS

## 2021-10-27 MED ORDER — POTASSIUM CHLORIDE CRYS ER 20 MEQ PO TBCR
40.0000 meq | EXTENDED_RELEASE_TABLET | Freq: Once | ORAL | Status: AC
  Administered 2021-10-27: 40 meq via ORAL
  Filled 2021-10-27: qty 2

## 2021-10-27 MED ORDER — POTASSIUM CHLORIDE 10 MEQ/100ML IV SOLN
10.0000 meq | INTRAVENOUS | Status: AC
  Administered 2021-10-27 (×2): 10 meq via INTRAVENOUS
  Filled 2021-10-27 (×2): qty 100

## 2021-10-27 NOTE — ED Provider Notes (Signed)
[facility] EMERGENCY DEPARTMENT Provider Note   CSN: [id number] Arrival date & time: 10/27/21  1755     History  Chief Complaint  Patient presents with   Loss of Consciousness    Samantha Molina is a 61 y.o. female.  HPI  61 year old female with medical history significant for nonobstructive CAD, stable angina on isosorbide mononitrate, HTN, depression, morbid obesity, DM2, presenting to the emergency department after syncopal episode.  The patient states that she has had intermittent sharp left-sided chest discomfort with associated pleuritic component that started before work today.  She presented to work anyways and had intermittent chest discomfort throughout the day.  She endorses some left jaw pain.  She had an admission for chest pain with negative troponins in May.  She recently underwent a coronary CTA outpatient and follows outpatient with cardiology for nonobstructive CAD.  She denies any cough, fever or chills.  She denies any lower extremity swelling.  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  She felt lightheaded.  She denied any chest pain.  She felt palpitations at the time.  She denied any diaphoresis or nausea at the time.  She subsequently came to and was transported to the emergency department for evaluation.  She to continues to endorse intermittent chest discomfort and jaw pain.  Home Medications Prior to Admission medications   Medication Sig Start Date End Date Taking? Authorizing Provider  acetaminophen (TYLENOL) 650 MG CR tablet Take 650 mg by mouth every 8 (eight) hours as needed for pain.    [provider]  ALPRAZolam Prudy Feeler) 0.25 MG tablet Take 0.5-1 tablets (0.125-0.25 mg total) by mouth daily as needed. Patient taking differently: Take 0.125-0.25 mg by mouth daily as needed for anxiety. 10/15/20     amLODipine (NORVASC) 10 MG  tablet Take 1 tablet (10 mg total) by mouth daily. 05/19/21     aspirin 81 MG chewable tablet Chew 1 tablet (81 mg total) by mouth daily. 08/18/21   Cipriano Bunker, MD  atorvastatin (LIPITOR) 10 MG tablet Take 1 tablet (10 mg total) by mouth daily. 07/22/21     cephALEXin (KEFLEX) 250 MG capsule Take 1 capsule (250 mg total) by mouth daily. 01/02/21     Cholecalciferol (VITAMIN D) 2000 UNITS tablet Take 2,000 Units by mouth daily.    [provider]  desvenlafaxine (PRISTIQ) 50 MG 24 hr tablet Take 1 tablet (50 mg total) by mouth daily. 09/03/21     lisinopril-hydrochlorothiazide (ZESTORETIC) 20-25 MG tablet Take 1 tablet by mouth daily. 09/03/21     Multiple Vitamins-Minerals (MULTIVITAMIN WITH MINERALS) tablet Take 1 tablet by mouth daily.    [provider]  oxybutynin (DITROPAN-XL) 10 MG 24 hr tablet Take 1 tablet (10 mg total) by mouth at bedtime. 08/21/21   Romualdo Bolk, MD  oxyCODONE-acetaminophen (PERCOCET) 10-325 MG tablet Take 1 tablet by mouth every 8 hours as needed for 30 days. 09/08/21     oxyCODONE-acetaminophen (PERCOCET) 10-325 MG tablet Take 1 tablet by mouth every 8 hours as needed 10/07/21     topiramate (TOPAMAX) 100 MG tablet Take 1 tablet (100 mg total) by mouth 2 (two) times daily. 05/19/21         Allergies    Isosorbide mononitrate [isosorbide nitrate]    Review of Systems   Review of Systems  All other systems reviewed and are negative.  Physical Exam Updated Vital Signs BP 90/60   Pulse 82   Temp 98.2 F (36.8 C) (Oral)   Resp (!) 29   Ht 5\' 6"  (1.676 m)   Wt 82 kg   SpO2 100%   BMI 29.18 kg/m  Physical Exam Vitals and nursing note reviewed.  Constitutional:      General: She is not in acute distress.    Appearance: She is well-developed.  HENT:     Head: Normocephalic and atraumatic.  Eyes:     Conjunctiva/sclera: Conjunctivae normal.  Cardiovascular:     Rate and Rhythm: Normal rate and regular rhythm.     Pulses: Normal  pulses.  Pulmonary:     Effort: Pulmonary effort is normal. No respiratory distress.     Breath sounds: Normal breath sounds.  Abdominal:     Palpations: Abdomen is soft.     Tenderness: There is no abdominal tenderness.  Musculoskeletal:        General: No swelling.     Cervical back: Neck supple.     Right lower leg: No edema.     Left lower leg: No edema.  Skin:    General: Skin is warm and dry.     Capillary Refill: Capillary refill takes less than 2 seconds.  Neurological:     General: No focal deficit present.     Mental Status: She is alert and oriented to person, place, and time. Mental status is at baseline.  Psychiatric:        Mood and Affect: Mood normal.     ED Results / Procedures / Treatments   Labs (all labs ordered are listed, but only abnormal results are displayed) Labs Reviewed  BASIC METABOLIC PANEL - Abnormal; Notable for the following components:      Result Value   Potassium 2.9 (*)    Creatinine, Ser 1.09 (*)    GFR, Estimated 58 (*)    All other components within normal limits  CBC WITH DIFFERENTIAL/PLATELET - Abnormal; Notable for the following components:   Hemoglobin 11.9 (*)    MCV 76.7 (*)    MCH 23.7 (*)    RDW 15.7 (*)    Platelets 429 (*)    All other components within normal limits  D-DIMER, QUANTITATIVE - Abnormal; Notable for the following components:   D-Dimer, Quant 0.91 (*)    All other components within normal limits  CBG MONITORING, ED - Abnormal; Notable for the following components:   Glucose-Capillary 104 (*)    All other components within normal limits  SARS CORONAVIRUS 2 BY RT PCR  MAGNESIUM  TROPONIN I (HIGH SENSITIVITY)  TROPONIN I (HIGH SENSITIVITY)    EKG EKG Interpretation  Date/Time:  Monday October 27 2021 18:15:39 EDT Ventricular Rate:  73 PR Interval:  144 QRS Duration: 82 QT Interval:  402 QTC Calculation: 442 R Axis:   -19 Text Interpretation: Normal sinus rhythm Minimal voltage criteria for LVH,  may be normal variant ( R in aVL ) Nonspecific T wave abnormality Abnormal ECG When compared with ECG of 16-Aug-2021 20:05, PREVIOUS ECG IS PRESENT Confirmed by 18-Aug-2021 ([id number]) on 10/27/2021 6:27:59 PM  Radiology CT Angio Chest PE W and/or Wo Contrast  Result Date: 10/27/2021 CLINICAL DATA:  Pulmonary embolism (PE) suspected, positive D-dimer Chest pain.  Syncopal episode. EXAM: CT ANGIOGRAPHY CHEST WITH CONTRAST TECHNIQUE: Multidetector CT imaging of the chest was performed using the standard protocol during bolus administration of intravenous contrast. Multiplanar CT image reconstructions and MIPs were  obtained to evaluate the vascular anatomy. RADIATION DOSE REDUCTION: This exam was performed according to the departmental dose-optimization program which includes automated exposure control, adjustment of the mA and/or kV according to patient size and/or use of iterative reconstruction technique. CONTRAST:  OMNIPAQUE IOHEXOL 350 MG/ML SOLN COMPARISON:  Radiograph earlier today.  Coronary CT 09/08/2021 FINDINGS: Cardiovascular: There are no filling defects within the pulmonary arteries to suggest pulmonary embolus. The heart is normal in size. Mild aortic tortuosity, no aneurysm. Minimal aortic atherosclerosis. The left vertebral artery arises directly from the thoracic aorta, variant arch anatomy. There are coronary artery calcifications. No pericardial effusion. Mediastinum/Nodes: No mediastinal adenopathy or mass. No hilar adenopathy. No esophageal wall thickening. No visualized thyroid nodule. Lungs/Pleura: Mild hypoventilatory changes in the dependent lungs. Subsegmental atelectasis in the right and left lower lobe. No confluent consolidation. No pleural effusion. No features of pulmonary edema trachea and central bronchi are patent. Upper Abdomen: Gastric band in place. No acute upper abdominal findings. Musculoskeletal: Thoracic spondylosis. There are no acute or suspicious osseous abnormalities.  No chest wall soft tissue abnormalities. Review of the MIP images confirms the above findings. IMPRESSION: 1. No pulmonary embolus or acute intrathoracic abnormality. 2. Coronary artery calcifications. Aortic Atherosclerosis (ICD10-I70.0). Electronically Signed   By: Narda Rutherford M.D.   On: 10/27/2021 21:08   DG Chest Portable 1 View  Result Date: 10/27/2021 CLINICAL DATA:  Hypotension, syncope, loss of consciousness EXAM: PORTABLE CHEST 1 VIEW COMPARISON:  08/16/2021 FINDINGS: The heart size and mediastinal contours are within normal limits. Both lungs are clear. The visualized skeletal structures are unremarkable. IMPRESSION: No active disease. Electronically Signed   By: Sharlet Salina M.D.   On: 10/27/2021 18:48    Procedures Procedures    Medications Ordered in ED Medications  potassium chloride 10 mEq in 100 mL IVPB (0 mEq Intravenous Stopped 10/27/21 2140)  sodium chloride 0.9 % bolus 1,000 mL (0 mLs Intravenous Stopped 10/27/21 1942)  potassium chloride SA (KLOR-CON M) CR tablet 40 mEq (40 mEq Oral Given 10/27/21 2017)  iohexol (OMNIPAQUE) 350 MG/ML injection 100 mL (100 mLs Intravenous Contrast Given 10/27/21 2058)    ED Course/ Medical Decision Making/ A&P Clinical Course as of 10/27/21 2201  Mon Oct 27, 2021  1931 D-Dimer, Quant(!): 0.91 [JL]  1931 Glucose-Capillary(!): 104 [JL]  1931 BP(!): 84/60 [JL]  1931 BP: 106/67 [JL]    Clinical Course User Index [JL] Ernie Avena, MD                           Medical Decision Making Amount and/or Complexity of Data Reviewed Labs: ordered. Decision-making details documented in ED Course. Radiology: ordered. ECG/medicine tests: ordered.  Risk Prescription drug management. Decision regarding hospitalization.    61 year old female with medical history significant for nonobstructive CAD, stable angina on isosorbide mononitrate, HTN, depression, morbid obesity, DM2, presenting to the emergency department after syncopal episode.   The patient states that she has had intermittent sharp left-sided chest discomfort with associated pleuritic component that started before work today.  She presented to work anyways and had intermittent chest discomfort throughout the day.  She endorses some left jaw pain.  She had an admission for chest pain with negative troponins in May.  She recently underwent a coronary CTA outpatient and follows outpatient with cardiology for nonobstructive CAD.  She denies any cough, fever or chills.  She denies any lower extremity swelling.  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  She felt lightheaded.  She denied any chest pain.  She felt palpitations at the time.  She denied any diaphoresis or nausea at the time.  She subsequently came to and was transported to the emergency department for evaluation.  She to continues to endorse intermittent chest discomfort and jaw pain.  On arrival, the patient was afebrile, not tachycardic or tachypneic, initially hypotensive BP 84/60, subsequent proved to 106/67 after recheck while the patient was lying down.  Orthostatic vitals not positive for orthostatic hypotension but did drop to hypotensive when the patient stood.  She endorses lightheadedness.  She had a syncopal episode earlier today.  Differential diagnosis includes cardiac arrhythmia, electrolyte abnormality, PE, less likely ACS or aortic dissection, vasovagal syncope considered.   Initial EKG revealed normal sinus rhythm, ventricular rate 73, minimal voltage criteria for LVH, nonspecific T wave changes present, no STEMI.  A chest x-ray was performed which revealed clear lungs, no active disease.   Laboratory evaluation significant for blood glucose 104, COVID-19 PCR pending, D-dimer elevated to 0.91, hemoglobin stable at 11.9, no leukocytosis noted on CBC.  BMP with hypokalemia to 2.9.  The patient's potassium  was replenished orally and IV.  COVID-19 PCR testing resulted negative.  Given the patient's elevated D-dimer, CTA PE imaging was performed which resulted: IMPRESSION:  1. No pulmonary embolus or acute intrathoracic abnormality.  2. Coronary artery calcifications.   The continue to have borderline soft blood pressures in the emergency department.  In the setting of moderate to high risk syncope, it was recommended the patient be admitted for observation on cardiac telemetry.  Medicine consulted for admission.   Final Clinical Impression(s) / ED Diagnoses Final diagnoses:  Syncope, unspecified syncope type  Hypotension, unspecified hypotension type    Rx / DC Orders ED Discharge Orders     None         Ernie Avena, MD 10/27/21 2201

## 2021-10-27 NOTE — ED Triage Notes (Signed)
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 Pt states complaining of central chest pain left jaw pain. Pt noted to be lethargic and slow response to answering questions.

## 2021-10-27 NOTE — H&P (Signed)
History and Physical    Samantha Molina OEU:[id number] DOB: Jul 15, 1960 DOA: 10/27/2021  PCP: Deatra James, MD  Patient coming from: work  I have personally briefly reviewed patient's old medical records in [facility]  Chief Complaint: synocpe, intermittent chest pain   HPI: Samantha Molina is a 61 y.o. female with medical history significant of DMII, HTN, MVP/mild -mod MR on echo , migraine,CAD non-obstructive, OA b/l knees, depression/anxiety, obesity s/p lap-band surgery 2007, history of cardiac evaluation for chest pain  5/23 that ruled out cardiac cause. Patient no presents to ED s/p syncopal episode at work . Per patient she has sharp intermittent pain in her chest on going most of day  prior to episode of syncope.  She also noted radiation to her arm and her back. She states at the onset of  she took home imdur. She notes chest pain eventually subsided. Per patient she states she has has syncope in the past , last episode a fever years ago.  She notes that prior to syncopal event she felt light headed as well.  Prior to the events of the day she states she was in her usual health, no fever/chills/diarrhea/ abdominal pain or dysuria.    ED Course:      Vitals afb, bp (84/60) 109/57, hr 81  sat 99%  Of note in ED patient was  noted to hypotensive which resolved with ivfs. She was also noted to have low K at 2.9, + di-dimer but negative CTPA. CE noted to be with in normal limits  MRI brain: IMPRESSION: No acute intracranial abnormality and essentially normal for age noncontrast MRI appearance of the brain.  resp panel:neg Labs Wbc6.6, hgb 11.9plt 429 NA 139, K 2.9 Review of Systems: As per HPI otherwise 10 point review of systems negative.   Past Medical History:  Diagnosis Date   Arthritis    Arthritis    both knees   Chronic leg pain    Coronary artery disease    Depression    Diabetes mellitus without complication (HCC)    Heart murmur    as a child    Hypertension    Migraine without aura    Morbid obesity (HCC)    Urinary incontinence    Vitamin D deficiency disease 2013    Past Surgical History:  Procedure Laterality Date   JOINT REPLACEMENT Left 2022   Knee   KNEE ARTHROSCOPY W/ ACL RECONSTRUCTION Left 2004   LAPAROSCOPIC CHOLECYSTECTOMY  2005   LAPAROSCOPIC GASTRIC BANDING  2007   TONSILLECTOMY  1984   Age 41   TOTAL VAGINAL HYSTERECTOMY  age 71   Ovaries remain, for menorrhagia   TUBAL LIGATION       reports that she has never smoked. She has never used smokeless tobacco. She reports that she does not currently use alcohol. She reports that she does not use drugs.  Allergies  Allergen Reactions   Isosorbide Mononitrate [Isosorbide Nitrate] Other (See Comments)    Headache    Family History  Problem Relation Age of Onset   Hypertension Mother    Rheum arthritis Mother        Lupus   Heart disease Mother    Lupus Mother    Diabetes Father    Hypertension Father    Stroke Father    Hyperlipidemia Father    Heart disease Father    Lupus Brother    Diabetes Brother    Hyperlipidemia Brother    Breast cancer Maternal  Aunt 64       X 2, one in her 50's   Lupus Maternal Aunt    Breast cancer Paternal Aunt 30       died in her 103's   Lymphoma Son    Breast cancer Cousin 17       maternal & paternal cousin   Colon cancer Neg Hx    Colon polyps Neg Hx    Esophageal cancer Neg Hx    Stomach cancer Neg Hx    Rectal cancer Neg Hx     Prior to Admission medications   Medication Sig Start Date End Date Taking? Authorizing Provider  acetaminophen (TYLENOL) 650 MG CR tablet Take 650 mg by mouth every 8 (eight) hours as needed for pain.    [provider]  ALPRAZolam Prudy Feeler) 0.25 MG tablet Take 0.5-1 tablets (0.125-0.25 mg total) by mouth daily as needed. Patient taking differently: Take 0.125-0.25 mg by mouth daily as needed for anxiety. 10/15/20     amLODipine (NORVASC) 10 MG tablet Take 1 tablet (10 mg  total) by mouth daily. 05/19/21     aspirin 81 MG chewable tablet Chew 1 tablet (81 mg total) by mouth daily. 08/18/21   Cipriano Bunker, MD  atorvastatin (LIPITOR) 10 MG tablet Take 1 tablet (10 mg total) by mouth daily. 07/22/21     cephALEXin (KEFLEX) 250 MG capsule Take 1 capsule (250 mg total) by mouth daily. 01/02/21     Cholecalciferol (VITAMIN D) 2000 UNITS tablet Take 2,000 Units by mouth daily.    [provider]  desvenlafaxine (PRISTIQ) 50 MG 24 hr tablet Take 1 tablet (50 mg total) by mouth daily. 09/03/21     lisinopril-hydrochlorothiazide (ZESTORETIC) 20-25 MG tablet Take 1 tablet by mouth daily. 09/03/21     Multiple Vitamins-Minerals (MULTIVITAMIN WITH MINERALS) tablet Take 1 tablet by mouth daily.    [provider]  oxybutynin (DITROPAN-XL) 10 MG 24 hr tablet Take 1 tablet (10 mg total) by mouth at bedtime. 08/21/21   Romualdo Bolk, MD  oxyCODONE-acetaminophen (PERCOCET) 10-325 MG tablet Take 1 tablet by mouth every 8 hours as needed for 30 days. 09/08/21     oxyCODONE-acetaminophen (PERCOCET) 10-325 MG tablet Take 1 tablet by mouth every 8 hours as needed 10/07/21     topiramate (TOPAMAX) 100 MG tablet Take 1 tablet (100 mg total) by mouth 2 (two) times daily. 05/19/21       Physical Exam: Vitals:   10/27/21 1930 10/27/21 2015 10/27/21 2021 10/27/21 2130  BP: 101/60 110/66  90/60  Pulse: 77 73  82  Resp: 14 15  (!) 29  Temp:      TempSrc:      SpO2: 99% 98%  100%  Weight:   82 kg   Height:   5\' 6"  (1.676 m)      Vitals:   10/27/21 1930 10/27/21 2015 10/27/21 2021 10/27/21 2130  BP: 101/60 110/66  90/60  Pulse: 77 73  82  Resp: 14 15  (!) 29  Temp:      TempSrc:      SpO2: 99% 98%  100%  Weight:   82 kg   Height:   5\' 6"  (1.676 m)   Constitutional: NAD, calm, comfortable Eyes: PERRL, lids and conjunctivae normal ENMT: Mucous membranes are moist. Posterior pharynx clear of any exudate or lesions.Normal dentition.  Neck: normal, supple, no  masses, no thyromegaly Respiratory: clear to auscultation bilaterally, no wheezing, no crackles. Normal respiratory effort. No accessory  muscle use.  Cardiovascular: Regular rate and rhythm, no murmurs / rubs / gallops. No extremity edema. 2+ pedal pulses. No carotid bruits.  Abdomen: no tenderness, no masses palpated. No hepatosplenomegaly. Bowel sounds positive.  Musculoskeletal: no clubbing / cyanosis. No joint deformity upper and lower extremities. Good ROM, no contractures. Normal muscle tone.  Skin: no rashes, lesions, ulcers. No induration Neurologic: CN 2-12 grossly intact. Sensation intact, DTR normal. Strength 5/5 in all 4.  Psychiatric: Normal judgment and insight. Alert and oriented x 3. Normal mood.    Labs on Admission: I have personally reviewed following labs and imaging studies  CBC: Recent Labs  Lab 10/27/21 1840  WBC 6.6  NEUTROABS 3.7  HGB 11.9*  HCT 38.6  MCV 76.7*  PLT 429*   Basic Metabolic Panel: Recent Labs  Lab 10/27/21 1840 10/27/21 2025  NA 139  --   K 2.9*  --   CL 106  --   CO2 25  --   GLUCOSE 92  --   BUN 21  --   CREATININE 1.09*  --   CALCIUM 9.7  --   MG  --  2.0   GFR: Estimated Creatinine Clearance: 58.5 mL/min (A) (by C-G formula based on SCr of 1.09 mg/dL (H)). Liver Function Tests: No results for input(s): "AST", "ALT", "ALKPHOS", "BILITOT", "PROT", "ALBUMIN" in the last 168 hours. No results for input(s): "LIPASE", "AMYLASE" in the last 168 hours. No results for input(s): "AMMONIA" in the last 168 hours. Coagulation Profile: No results for input(s): "INR", "PROTIME" in the last 168 hours. Cardiac Enzymes: No results for input(s): "CKTOTAL", "CKMB", "CKMBINDEX", "TROPONINI" in the last 168 hours. BNP (last 3 results) No results for input(s): "PROBNP" in the last 8760 hours. HbA1C: No results for input(s): "HGBA1C" in the last 72 hours. CBG: Recent Labs  Lab 10/27/21 1805  GLUCAP 104*   Lipid Profile: No results for  input(s): "CHOL", "HDL", "LDLCALC", "TRIG", "CHOLHDL", "LDLDIRECT" in the last 72 hours. Thyroid Function Tests: No results for input(s): "TSH", "T4TOTAL", "FREET4", "T3FREE", "THYROIDAB" in the last 72 hours. Anemia Panel: No results for input(s): "VITAMINB12", "FOLATE", "FERRITIN", "TIBC", "IRON", "RETICCTPCT" in the last 72 hours. Urine analysis:    Component Value Date/Time   COLORURINE YELLOW 07/06/2016 1237   APPEARANCEUR HAZY (A) 07/06/2016 1237   LABSPEC 1.015 07/06/2016 1237   PHURINE 6.0 07/06/2016 1237   GLUCOSEU NEGATIVE 07/06/2016 1237   HGBUR LARGE (A) 07/06/2016 1237   BILIRUBINUR NEGATIVE 07/06/2016 1237   KETONESUR 5 (A) 07/06/2016 1237   PROTEINUR 30 (A) 07/06/2016 1237   UROBILINOGEN 1.0 01/18/2010 1310   NITRITE NEGATIVE 07/06/2016 1237   LEUKOCYTESUR MODERATE (A) 07/06/2016 1237    Radiological Exams on Admission: CT Angio Chest PE W and/or Wo Contrast  Result Date: 10/27/2021 CLINICAL DATA:  Pulmonary embolism (PE) suspected, positive D-dimer Chest pain.  Syncopal episode. EXAM: CT ANGIOGRAPHY CHEST WITH CONTRAST TECHNIQUE: Multidetector CT imaging of the chest was performed using the standard protocol during bolus administration of intravenous contrast. Multiplanar CT image reconstructions and MIPs were obtained to evaluate the vascular anatomy. RADIATION DOSE REDUCTION: This exam was performed according to the departmental dose-optimization program which includes automated exposure control, adjustment of the mA and/or kV according to patient size and/or use of iterative reconstruction technique. CONTRAST:  OMNIPAQUE IOHEXOL 350 MG/ML SOLN COMPARISON:  Radiograph earlier today.  Coronary CT 09/08/2021 FINDINGS: Cardiovascular: There are no filling defects within the pulmonary arteries to suggest pulmonary embolus. The heart is normal in  size. Mild aortic tortuosity, no aneurysm. Minimal aortic atherosclerosis. The left vertebral artery arises directly from the  thoracic aorta, variant arch anatomy. There are coronary artery calcifications. No pericardial effusion. Mediastinum/Nodes: No mediastinal adenopathy or mass. No hilar adenopathy. No esophageal wall thickening. No visualized thyroid nodule. Lungs/Pleura: Mild hypoventilatory changes in the dependent lungs. Subsegmental atelectasis in the right and left lower lobe. No confluent consolidation. No pleural effusion. No features of pulmonary edema trachea and central bronchi are patent. Upper Abdomen: Gastric band in place. No acute upper abdominal findings. Musculoskeletal: Thoracic spondylosis. There are no acute or suspicious osseous abnormalities. No chest wall soft tissue abnormalities. Review of the MIP images confirms the above findings. IMPRESSION: 1. No pulmonary embolus or acute intrathoracic abnormality. 2. Coronary artery calcifications. Aortic Atherosclerosis (ICD10-I70.0). Electronically Signed   By: Narda Rutherford M.D.   On: 10/27/2021 21:08   DG Chest Portable 1 View  Result Date: 10/27/2021 CLINICAL DATA:  Hypotension, syncope, loss of consciousness EXAM: PORTABLE CHEST 1 VIEW COMPARISON:  08/16/2021 FINDINGS: The heart size and mediastinal contours are within normal limits. Both lungs are clear. The visualized skeletal structures are unremarkable. IMPRESSION: No active disease. Electronically Signed   By: Sharlet Salina M.D.   On: 10/27/2021 18:48    EKG: Independently reviewed.  NSR,LVH  nonspecific Twave changes   Assessment/Plan   Syncope  -insetting of angina for which patient took imdur As well as history MVP - ? Medication side-effect , vs other cardiac etiology  -initial ischemic evaluation noted to be negative  -CTPA negative  as well  - patient had orthostatic completed in ED which were negative -no further chest pain or syncope since admission -repeat echo to be complete  -cardiology consult  in am   Chest pain  Angina  Hx of CAD non-obstructive   Minimal to mild  nonobstructive on CTA 07/2021. -per cardiology evaluation chest pain noncardiac - due to lower bp will hold imdur  -f/u echo  -ischemic evaluation thus far neg for acs  -cardiology to leave final recs    DMII -diet controlled  -poc   HTN -hold bp meds due to soft bp  resume as able    MVP/mild -mod MR on echo ,   Migraine -no active isues   Depression/anxiety -resume regimen once med rec completed   DVT prophylaxis: heparin Code Status: full Family Communication: none at bedside Disposition Plan: patient  expected to be admitted less than 2 midnights Consults called:  Admission status: cardiac tele   Lurline Del MD [facility]   If 7PM-7AM, please contact night-coverage [email or web address] Password [facility]  10/27/2021, 10:33 PM

## 2021-10-27 NOTE — ED Notes (Signed)
Patient requests not to be admitted to 5W since this is the floor she works on to keep her care as private as possible.

## 2021-10-27 NOTE — Progress Notes (Signed)
[facility] PREP Weekly Session  Patient Details  Name: Samantha Molina MRN: [id number] Date of Birth: [birth year] Age: 61 y.o. PCP: Deatra James, MD  Vitals:   10/27/21 1031  Weight: 181 lb 6.4 oz (82.3 kg)     [facility] Weekly seesion - 10/27/21 1000       [facility] "PREP" Location   [facility] "PREP" Location [facility]      Weekly Session   Topic Discussed Importance of resistance training;Other ways to be active   150 min cardio/wk; strength training 2-3 times/wk, 20-40 minutes   Minutes exercised this week 45 minutes    Classes attended to date 3             Nathanyel Defenbaugh B Wylene Weissman 10/27/2021, 10:31 AM

## 2021-10-28 ENCOUNTER — Inpatient Hospital Stay (HOSPITAL_COMMUNITY): Payer: 59

## 2021-10-28 ENCOUNTER — Encounter (HOSPITAL_COMMUNITY): Payer: Self-pay | Admitting: Internal Medicine

## 2021-10-28 DIAGNOSIS — E876 Hypokalemia: Secondary | ICD-10-CM | POA: Diagnosis not present

## 2021-10-28 DIAGNOSIS — I10 Essential (primary) hypertension: Secondary | ICD-10-CM

## 2021-10-28 DIAGNOSIS — I2510 Atherosclerotic heart disease of native coronary artery without angina pectoris: Secondary | ICD-10-CM

## 2021-10-28 DIAGNOSIS — R079 Chest pain, unspecified: Secondary | ICD-10-CM | POA: Diagnosis not present

## 2021-10-28 DIAGNOSIS — R519 Headache, unspecified: Secondary | ICD-10-CM | POA: Diagnosis not present

## 2021-10-28 DIAGNOSIS — I959 Hypotension, unspecified: Secondary | ICD-10-CM | POA: Diagnosis not present

## 2021-10-28 DIAGNOSIS — Z79899 Other long term (current) drug therapy: Secondary | ICD-10-CM | POA: Diagnosis not present

## 2021-10-28 DIAGNOSIS — R55 Syncope and collapse: Secondary | ICD-10-CM | POA: Diagnosis not present

## 2021-10-28 DIAGNOSIS — Z7982 Long term (current) use of aspirin: Secondary | ICD-10-CM | POA: Diagnosis not present

## 2021-10-28 DIAGNOSIS — Z20822 Contact with and (suspected) exposure to covid-19: Secondary | ICD-10-CM | POA: Diagnosis not present

## 2021-10-28 DIAGNOSIS — R0789 Other chest pain: Secondary | ICD-10-CM

## 2021-10-28 LAB — ECHOCARDIOGRAM COMPLETE
Area-P 1/2: 3.68 cm2
Height: 66 in
MV M vel: 5.24 m/s
MV Peak grad: 109.8 mmHg
S' Lateral: 2.9 cm
Weight: 2959.98 oz

## 2021-10-28 LAB — URINALYSIS, ROUTINE W REFLEX MICROSCOPIC
Bilirubin Urine: NEGATIVE
Glucose, UA: NEGATIVE mg/dL
Hgb urine dipstick: NEGATIVE
Ketones, ur: NEGATIVE mg/dL
Nitrite: POSITIVE — AB
Protein, ur: NEGATIVE mg/dL
Specific Gravity, Urine: 1.018 (ref 1.005–1.030)
pH: 7 (ref 5.0–8.0)

## 2021-10-28 LAB — BASIC METABOLIC PANEL
Anion gap: 4 — ABNORMAL LOW (ref 5–15)
BUN: 12 mg/dL (ref 8–23)
CO2: 25 mmol/L (ref 22–32)
Calcium: 9.1 mg/dL (ref 8.9–10.3)
Chloride: 111 mmol/L (ref 98–111)
Creatinine, Ser: 0.73 mg/dL (ref 0.44–1.00)
GFR, Estimated: >60 mL/min (ref >60)
Glucose, Bld: 88 mg/dL (ref 70–99)
Potassium: 3.1 mmol/L — ABNORMAL LOW (ref 3.5–5.1)
Sodium: 140 mmol/L (ref 135–145)

## 2021-10-28 LAB — GLUCOSE, CAPILLARY
Glucose-Capillary: 83 mg/dL (ref 70–99)
Glucose-Capillary: 76 mg/dL (ref 70–99)
Glucose-Capillary: 100 mg/dL — ABNORMAL HIGH (ref 70–99)

## 2021-10-28 MED ORDER — ONDANSETRON HCL 4 MG/2ML IJ SOLN: 4.0000 mg | Freq: Four times a day (QID) | INTRAMUSCULAR | Status: DC | PRN

## 2021-10-28 MED ORDER — ACETAMINOPHEN 325 MG PO TABS
650.0000 mg | ORAL_TABLET | Freq: Four times a day (QID) | ORAL | Status: DC | PRN
  Administered 2021-10-28: 650 mg via ORAL
  Filled 2021-10-28 (×2): qty 2

## 2021-10-28 MED ORDER — HEPARIN SODIUM (PORCINE) 5000 UNIT/ML IJ SOLN
5000.0000 [IU] | Freq: Three times a day (TID) | INTRAMUSCULAR | Status: DC
Start: 2021-10-28 — End: 2021-10-29
  Administered 2021-10-28 – 2021-10-29 (×3): 5000 [IU] via SUBCUTANEOUS
  Filled 2021-10-28 (×3): qty 1

## 2021-10-28 MED ORDER — ATORVASTATIN CALCIUM 10 MG PO TABS
10.0000 mg | ORAL_TABLET | Freq: Every day | ORAL | Status: DC
  Administered 2021-10-28 – 2021-10-29 (×2): 10 mg via ORAL
  Filled 2021-10-28 (×2): qty 1

## 2021-10-28 MED ORDER — SODIUM CHLORIDE 0.9% FLUSH
3.0000 mL | Freq: Two times a day (BID) | INTRAVENOUS | Status: DC
  Administered 2021-10-28 – 2021-10-29 (×3): 3 mL via INTRAVENOUS

## 2021-10-28 MED ORDER — SODIUM CHLORIDE 0.9 % IV SOLN
INTRAVENOUS | Status: AC
Start: 2021-10-28 — End: 2021-10-29

## 2021-10-28 MED ORDER — POTASSIUM CHLORIDE CRYS ER 20 MEQ PO TBCR
40.0000 meq | EXTENDED_RELEASE_TABLET | Freq: Once | ORAL | Status: AC
  Administered 2021-10-28: 40 meq via ORAL
  Filled 2021-10-28: qty 2

## 2021-10-28 MED ORDER — ONDANSETRON HCL 4 MG PO TABS: 4.0000 mg | ORAL_TABLET | Freq: Four times a day (QID) | ORAL | Status: DC | PRN

## 2021-10-28 MED ORDER — ACETAMINOPHEN 650 MG RE SUPP: 650.0000 mg | Freq: Four times a day (QID) | RECTAL | Status: DC | PRN

## 2021-10-28 MED ORDER — ASPIRIN 81 MG PO CHEW
81.0000 mg | CHEWABLE_TABLET | Freq: Every day | ORAL | Status: DC
  Administered 2021-10-28 – 2021-10-29 (×2): 81 mg via ORAL
  Filled 2021-10-28 (×2): qty 1

## 2021-10-28 MED ORDER — OXYCODONE-ACETAMINOPHEN 5-325 MG PO TABS
1.0000 | ORAL_TABLET | Freq: Four times a day (QID) | ORAL | Status: DC | PRN
  Administered 2021-10-28 (×2): 1 via ORAL
  Filled 2021-10-28 (×3): qty 1

## 2021-10-28 MED ORDER — ALUM & MAG HYDROXIDE-SIMETH 200-200-20 MG/5ML PO SUSP
30.0000 mL | Freq: Once | ORAL | Status: AC
  Administered 2021-10-28: 30 mL via ORAL
  Filled 2021-10-28: qty 30

## 2021-10-28 NOTE — Plan of Care (Signed)

## 2021-10-28 NOTE — Evaluation (Signed)
Physical Therapy Evaluation Patient Details Name: Samantha Molina MRN: [id number] DOB: 1960-03-28 Today's Date: 10/28/2021  History of Present Illness  Pt adm 8/7 after syncopal episode and chest pain. Cardiology consulted and symptoms not suggestive of cardiac etiology and syncope likely due to low BP after taking Indur. PMH - TKR, arthritis, HTN, DM, CAD, lap band  Clinical Impression  Pt doing well with mobility and no further PT needed.  Ready for dc from PT standpoint.        Recommendations for follow up therapy are one component of a multi-disciplinary discharge planning process, led by the attending physician.  Recommendations may be updated based on patient status, additional functional criteria and insurance authorization.  Follow Up Recommendations No PT follow up      Assistance Recommended at Discharge None  Patient can return home with the following       Equipment Recommendations None recommended by PT  Recommendations for Other Services       Functional Status Assessment Patient has not had a recent decline in their functional status     Precautions / Restrictions Precautions Precautions: None      Mobility  Bed Mobility Overal bed mobility: Independent                  Transfers Overall transfer level: Independent Equipment used: None                    Ambulation/Gait Ambulation/Gait assistance: Modified independent (Device/Increase time) Gait Distance (Feet): 200 Feet Assistive device: IV Pole Gait Pattern/deviations: Step-through pattern, Decreased stride length Gait velocity: decr Gait velocity interpretation: 1.31 - 2.62 ft/sec, indicative of limited community ambulator   General Gait Details: Slow, steady gait.  Stairs            Wheelchair Mobility    Modified Rankin (Stroke Patients Only)       Balance Overall balance assessment: No apparent balance deficits (not formally assessed)                                            Pertinent Vitals/Pain Pain Assessment Pain Assessment: Faces Faces Pain Scale: No hurt    Home Living Family/patient expects to be discharged to:: Private residence Living Arrangements: Spouse/significant other               Home Equipment: Cane - single Librarian, academic (2 wheels)      Prior Function Prior Level of Function : Independent/Modified Independent;Working/employed;Driving             Mobility Comments: Works as Licensed conveyancer at [employer]        Extremity/Trunk Assessment   Upper Extremity Assessment Upper Extremity Assessment: Overall WFL for tasks assessed    Lower Extremity Assessment Lower Extremity Assessment: Overall WFL for tasks assessed       Communication   Communication: No difficulties  Cognition Arousal/Alertness: Awake/alert Behavior During Therapy: WFL for tasks assessed/performed Overall Cognitive Status: Within Functional Limits for tasks assessed                                          General Comments General comments (skin integrity, edema, etc.): Mild lightheadedness initially with mobility    Exercises  Assessment/Plan    PT Assessment Patient does not need any further PT services  PT Problem List         PT Treatment Interventions      PT Goals (Current goals can be found in the Care Plan section)  Acute Rehab PT Goals PT Goal Formulation: All assessment and education complete, DC therapy    Frequency       Co-evaluation               AM-PAC PT "6 Clicks" Mobility  Outcome Measure Help needed turning from your back to your side while in a flat bed without using bedrails?: None Help needed moving from lying on your back to sitting on the side of a flat bed without using bedrails?: None Help needed moving to and from a bed to a chair (including a wheelchair)?: None Help needed standing up from a chair using your arms  (e.g., wheelchair or bedside chair)?: None Help needed to walk in hospital room?: None Help needed climbing 3-5 steps with a railing? : None 6 Click Score: 24    End of Session   Activity Tolerance: Patient tolerated treatment well Patient left: in bed;with call bell/phone within reach (sitting EOB)   PT Visit Diagnosis: Other abnormalities of gait and mobility (R26.89)    Time: 0350-0938 PT Time Calculation (min) (ACUTE ONLY): 13 min   Charges:   PT Evaluation $PT Eval Low Complexity: 1 Low          [facility] PT Acute Rehabilitation Services Office [phone]   Angelina Ok [facility] 10/28/2021, 5:21 PM

## 2021-10-28 NOTE — Consult Note (Signed)
Cardiology Consultation:   Patient ID: Samantha Molina MRN: [id number]; DOB: Mar 29, 1960  Admit date: 10/27/2021 Date of Consult: 10/28/2021  PCP:  Deatra James, MD   [facility] Providers Cardiologist:  Jodelle Red, MD        Patient Profile:   Samantha Molina is a 61 y.o. female with a hx of hypertension, noncardiac chest pain, prior lap-band gastric bypass, who is being seen 10/28/2021 for the evaluation of syncope at the request of Dr. Butler Denmark.  History of Present Illness:   Samantha Molina presented after an even of chest pain and near syncope. She was busy with a family event over the weekend. She ate normally on 8/5 (Saturday) but on Sunday 8/6 only had a small amount of pasta salad. On Monday 8/7 she went to get a shower around 1-1:30 PM and noted severe sharp and squeezing central chest pain that also radiated to her jaw. In total it lasted 3-5 minutes but was severe enough that she almost didn't go to work. She took an Teacher, English as a foreign language, and then proceeded to go to work. She had not had anything to eat that day prior to the episode. Around 5-5:30 PM she felt poorly and put her head down on the desk, feeling like she might pass out. Actual duration/occurrence of loss of consciousness is unclear. Was not having chest pain at the time. She was brought to the ER. We reviewed her workup, including hs Tn of 4, 4.  She has not had additional issues since yesterday. Her lap-band procedure is >68 years old. No endoscopy since procedure. She does feel that food gets stuck, or that she has to chew her food a lot to get it to go down. No emesis. No melena or hematochezia.   In the ER, initial BP 84/60, and while orthostatics not positive, her BP did decrease somewhat with standing. CTPE negative. MRI brain without acute process   Past Medical History:  Diagnosis Date   Arthritis    Arthritis    both knees   Chronic leg pain    Coronary artery disease    Depression    Diabetes mellitus  without complication (HCC)    Heart murmur    as a child   Hypertension    Migraine without aura    Morbid obesity (HCC)    Urinary incontinence    Vitamin D deficiency disease 2013    Past Surgical History:  Procedure Laterality Date   JOINT REPLACEMENT Left 2022   Knee   KNEE ARTHROSCOPY W/ ACL RECONSTRUCTION Left 2004   LAPAROSCOPIC CHOLECYSTECTOMY  2005   LAPAROSCOPIC GASTRIC BANDING  2007   TONSILLECTOMY  1984   Age 45   TOTAL VAGINAL HYSTERECTOMY  age 53   Ovaries remain, for menorrhagia   TUBAL LIGATION       Home Medications:  Prior to Admission medications   Medication Sig Start Date End Date Taking? Authorizing Provider  acetaminophen (TYLENOL) 650 MG CR tablet Take 650 mg by mouth every 8 (eight) hours as needed for pain.    [provider]  ALPRAZolam Prudy Feeler) 0.25 MG tablet Take 0.5-1 tablets (0.125-0.25 mg total) by mouth daily as needed. Patient taking differently: Take 0.125-0.25 mg by mouth daily as needed for anxiety. 10/15/20     amLODipine (NORVASC) 10 MG tablet Take 1 tablet (10 mg total) by mouth daily. 05/19/21     aspirin 81 MG chewable tablet Chew 1 tablet (81 mg total) by mouth daily. 08/18/21   Lucianne Muss,  Pardeep, MD  atorvastatin (LIPITOR) 10 MG tablet Take 1 tablet (10 mg total) by mouth daily. 07/22/21     cephALEXin (KEFLEX) 250 MG capsule Take 1 capsule (250 mg total) by mouth daily. 01/02/21     Cholecalciferol (VITAMIN D) 2000 UNITS tablet Take 2,000 Units by mouth daily.    [provider]  desvenlafaxine (PRISTIQ) 50 MG 24 hr tablet Take 1 tablet (50 mg total) by mouth daily. 09/03/21     lisinopril-hydrochlorothiazide (ZESTORETIC) 20-25 MG tablet Take 1 tablet by mouth daily. 09/03/21     Multiple Vitamins-Minerals (MULTIVITAMIN WITH MINERALS) tablet Take 1 tablet by mouth daily.    [provider]  oxybutynin (DITROPAN-XL) 10 MG 24 hr tablet Take 1 tablet (10 mg total) by mouth at bedtime. 08/21/21   Romualdo Bolk, MD   oxyCODONE-acetaminophen (PERCOCET) 10-325 MG tablet Take 1 tablet by mouth every 8 hours as needed for 30 days. 09/08/21     oxyCODONE-acetaminophen (PERCOCET) 10-325 MG tablet Take 1 tablet by mouth every 8 hours as needed 10/07/21     topiramate (TOPAMAX) 100 MG tablet Take 1 tablet (100 mg total) by mouth 2 (two) times daily. 05/19/21       Inpatient Medications: Scheduled Meds:  aspirin  81 mg Oral Daily   atorvastatin  10 mg Oral Daily   heparin  5,000 Units Subcutaneous Q8H   sodium chloride flush  3 mL Intravenous Q12H   Continuous Infusions:  sodium chloride 75 mL/hr at 10/28/21 1417   PRN Meds: acetaminophen **OR** acetaminophen, ondansetron **OR** ondansetron (ZOFRAN) IV, oxyCODONE-acetaminophen  Allergies:    Allergies  Allergen Reactions   Isosorbide Mononitrate [Isosorbide Nitrate] Other (See Comments)    Headache    Social History:   Social History   Socioeconomic History   Marital status: Married    Spouse name: Not on file   Number of children: 5   Years of education: Not on file   Highest education level: Not on file  Occupational History   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  Tobacco Use   Smoking status: Never   Smokeless tobacco: Never  Vaping Use   Vaping Use: Never used  Substance and Sexual Activity   Alcohol use: Not Currently    Comment: rarely   Drug use: No   Sexual activity: Not Currently    Partners: Male    Birth control/protection: Surgical    Comment: TVH  Other Topics Concern   Not on file  Social History Narrative   Not on file   Social Determinants of Health   Financial Resource Strain: Not on file  Food Insecurity: Not on file  Transportation Needs: Not on file  Physical Activity: Not on file  Stress: Not on file  Social Connections: Not on file  Intimate Partner Violence: Not on file    Family History:    Family History  Problem Relation Age of Onset   Hypertension Mother    Rheum  arthritis Mother        Lupus   Heart disease Mother    Lupus Mother    Diabetes Father    Hypertension Father    Stroke Father    Hyperlipidemia Father    Heart disease Father    Lupus Brother    Diabetes Brother    Hyperlipidemia Brother    Breast cancer Maternal Aunt 34       X 2, one in her 42's   Lupus Maternal  Aunt    Breast cancer Paternal Aunt 30       died in her 58's   Lymphoma Son    Breast cancer Cousin 66       maternal & paternal cousin   Colon cancer Neg Hx    Colon polyps Neg Hx    Esophageal cancer Neg Hx    Stomach cancer Neg Hx    Rectal cancer Neg Hx      ROS:  Please see the history of present illness.  Constitutional: Negative for chills, fever, night sweats, unintentional weight loss  HENT: Negative for ear pain and hearing loss.   Eyes: Negative for loss of vision and eye pain.  Respiratory: Negative for cough, sputum, wheezing.   Cardiovascular: See HPI. Gastrointestinal: Negative for abdominal pain, melena, and hematochezia.  Genitourinary: Negative for dysuria and hematuria.  Musculoskeletal: Negative for falls and myalgias.  Skin: Negative for itching and rash.  Neurological: Negative for focal weakness, focal sensory changes Endo/Heme/Allergies: Does not bruise/bleed easily.   All other ROS reviewed and negative.     Physical Exam/Data:   Vitals:   10/28/21 1104 10/28/21 1400 10/28/21 1402 10/28/21 1403  BP: 115/70 (!) 125/53 120/65 123/64  Pulse: 65 73 83 74  Resp: 19     Temp: 98.1 F (36.7 C)     TempSrc: Oral     SpO2: 98% 100% 100% 100%  Weight:      Height:        Intake/Output Summary (Last 24 hours) at 10/28/2021 1654 Last data filed at 10/28/2021 1630 Gross per 24 hour  Intake 1778.26 ml  Output 800 ml  Net 978.26 ml      10/28/2021    5:27 AM 10/28/2021    2:54 AM 10/27/2021    8:21 PM  Last 3 Weights  Weight (lbs) 185 lb 185 lb 180 lb 12.4 oz  Weight (kg) 83.915 kg 83.915 kg 82 kg     Body mass index is 29.86  kg/m.  General:  Well nourished, well developed, in no acute distress HEENT: normal Neck: no JVD Vascular: No carotid bruits; Distal pulses 2+ bilaterally Cardiac:  normal S1, S2; RRR; no murmur  Lungs:  clear to auscultation bilaterally, no wheezing, rhonchi or rales  Abd: soft, nontender, no hepatomegaly  Ext: no edema Musculoskeletal:  No deformities, BUE and BLE strength normal and equal Skin: warm and dry  Neuro:  CNs 2-12 intact, no focal abnormalities noted Psych:  Normal affect   EKG:  The EKG was personally reviewed and demonstrates:  NSR  Telemetry:  Telemetry was personally reviewed and demonstrates:  NSR  Relevant CV Studies: Echo 10/28/21 1. Left ventricular ejection fraction, by estimation, is 60 to 65%. Left  ventricular ejection fraction by 3D volume is 59 %. The left ventricle has  normal function. The left ventricle has no regional wall motion  abnormalities. Left ventricular diastolic   parameters were normal.   2. Right ventricular systolic function is normal. The right ventricular  size is normal. There is normal pulmonary artery systolic pressure. The  estimated right ventricular systolic pressure is 23.8 mmHg.   3. Left atrial size was mildly dilated.   4. 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 Mild to moderate mitral valve  regurgitation.   5. The aortic valve is tricuspid. There is mild calcification of the  aortic valve. Aortic valve regurgitation is not visualized. Aortic valve  sclerosis is present, with no evidence of aortic valve stenosis.   Coronary  CT 09/08/21 FINDINGS: Quality: Fair, attenuation artifact, HR 54, scanned retrospective due to significant heart rate variability   Coronary calcium score: The patient's coronary artery calcium score is 183, which places the patient in the 94th percentile.   Coronary arteries: Normal coronary origins.  Right dominance.   Right Coronary Artery: Dominant.  Normal vessel.   Left Main Coronary Artery:  Normal. Bifurcates into the LAD and LCX arteries.   Left Anterior Descending Coronary Artery: Large anterior artery that wraps around the apex. There is mild proximal 24-49% mixed stenosis (CADRADS2). D1 branch has minimal 1-24% mixed ostial stenosis (CADRADS1).   Left Circumflex Artery: AV groove vessel, no significant disease.   Aorta: Normal size, 33 mm at the mid ascending aorta (level of the PA bifurcation) measured double oblique. No calcifications. No dissection.   Aortic Valve: Trileaflet. No calcifications.   Other findings: Normal pulmonary vein drainage into the left atrium.  Normal left atrial appendage without a thrombus.  Normal size of the pulmonary artery.  Gastric lap-band noted  Aneurysmal membranous septum without obvious VSD.   IMPRESSION: 1. Minimal to mild non-obstructive CAD, CADRADS = 2.   2. Coronary calcium score of 183. This was 94th percentile for age and sex matched control.   3. Normal coronary origin with right dominance.   4. Aggressive cardiovascular risk factor modification recommended   5. Aneurysmal membranous septum without obvious VSD.   6. Consider non-coronary causes of chest pain.  Echo 08/16/21  1. Left ventricular ejection fraction, by estimation, is 60 to 65%. The  left ventricle has normal function. The left ventricle has no regional  wall motion abnormalities. There is mild left ventricular hypertrophy.  Left ventricular diastolic parameters  are consistent with Grade I diastolic dysfunction (impaired relaxation).   2. Right ventricular systolic function is normal. The right ventricular  size is normal. There is normal pulmonary artery systolic pressure. The  estimated right ventricular systolic pressure is 26.0 mmHg.   3. 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 Mild to moderate mitral valve  regurgitation. There is moderate late systolic prolapse of multiple  segments of the anterior leaflet of the mitral valve.   4. The tricuspid  valve is myxomatous. There is mild tricuspid  regurgitation.   5. The aortic valve is tricuspid. Aortic valve regurgitation is trivial.   6. The inferior vena cava is normal in size with greater than 50%  respiratory variability, suggesting right atrial pressure of 3 mmHg.   7. No evidence for residual VSD   Comparison(s): Changes from prior study are noted. 10/24/2019: LVEF 60-65%.   Laboratory Data:  High Sensitivity Troponin:   Recent Labs  Lab 10/27/21 1840 10/27/21 2025  TROPONINIHS 4 4     Chemistry Recent Labs  Lab 10/27/21 1840 10/27/21 2025 10/28/21 1437  NA 139  --  140  K 2.9*  --  3.1*  CL 106  --  111  CO2 25  --  25  GLUCOSE 92  --  88  BUN 21  --  12  CREATININE 1.09*  --  0.73  CALCIUM 9.7  --  9.1  MG  --  2.0  --   GFRNONAA 58*  --  >60  ANIONGAP 8  --  4*    No results for input(s): "PROT", "ALBUMIN", "AST", "ALT", "ALKPHOS", "BILITOT" in the last 168 hours. Lipids No results for input(s): "CHOL", "TRIG", "HDL", "LABVLDL", "LDLCALC", "CHOLHDL" in the last 168 hours.  Hematology Recent Labs  Lab 10/27/21 1840  WBC 6.6  RBC 5.03  HGB 11.9*  HCT 38.6  MCV 76.7*  MCH 23.7*  MCHC 30.8  RDW 15.7*  PLT 429*   Thyroid No results for input(s): "TSH", "FREET4" in the last 168 hours.  BNPNo results for input(s): "BNP", "PROBNP" in the last 168 hours.  DDimer  Recent Labs  Lab 10/27/21 1840  DDIMER 0.91*     Radiology/Studies:  ECHOCARDIOGRAM COMPLETE  Result Date: 10/28/2021    ECHOCARDIOGRAM REPORT   Patient Name:   KANIAH RIZZOLO [facility] Date of Exam: 10/28/2021 Medical Rec #:  [id number]           Height:       66.0 in Accession #:    [id number]          Weight:       185.0 lb Date of Birth:  [birth year]           BSA:          1.935 m Patient Age:    61 years            BP:           100/58 mmHg Patient Gender: F                   HR:           65 bpm. Exam Location:  Inpatient Procedure: 2D Echo, 3D Echo, Cardiac Doppler and Color Doppler  Indications:    Syncope  History:        Patient has prior history of Echocardiogram examinations, most                 recent 08/16/2021. CAD, Signs/Symptoms:Murmur; Risk                 Factors:Hypertension, Diabetes and obesity.  Sonographer:    Milda Smart Referring Phys: [id number] SAIMA RIZWAN IMPRESSIONS  1. Left ventricular ejection fraction, by estimation, is 60 to 65%. Left ventricular ejection fraction by 3D volume is 59 %. The left ventricle has normal function. The left ventricle has no regional wall motion abnormalities. Left ventricular diastolic  parameters were normal.  2. Right ventricular systolic function is normal. The right ventricular size is normal. There is normal pulmonary artery systolic pressure. The estimated right ventricular systolic pressure is 23.8 mmHg.  3. Left atrial size was mildly dilated.  4. 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 Mild to moderate mitral valve regurgitation.  5. The aortic valve is tricuspid. There is mild calcification of the aortic valve. Aortic valve regurgitation is not visualized. Aortic valve sclerosis is present, with no evidence of aortic valve stenosis. FINDINGS  Left Ventricle: Left ventricular ejection fraction, by estimation, is 60 to 65%. Left ventricular ejection fraction by 3D volume is 59 %. The left ventricle has normal function. The left ventricle has no regional wall motion abnormalities. The left ventricular internal cavity size was normal in size. There is no left ventricular hypertrophy. Left ventricular diastolic parameters were normal. Normal left ventricular filling pressure. Right Ventricle: The right ventricular size is normal. No increase in right ventricular wall thickness. Right ventricular systolic function is normal. There is normal pulmonary artery systolic pressure. The tricuspid regurgitant velocity is 2.28 m/s, and  with an assumed right atrial pressure of 3 mmHg, the estimated right ventricular systolic pressure is 23.8 mmHg. Left  Atrium: Left atrial size was mildly dilated. Right Atrium: Right atrial size was normal in size. Pericardium: There is no evidence of pericardial effusion.  Mitral Valve: The mitral valve is myxomatous. Mild to moderate mitral valve regurgitation, with centrally-directed jet. Tricuspid Valve: The tricuspid valve is normal in structure. Tricuspid valve regurgitation is trivial. Aortic Valve: The aortic valve is tricuspid. There is mild calcification of the aortic valve. Aortic valve regurgitation is not visualized. Aortic valve sclerosis is present, with no evidence of aortic valve stenosis. Pulmonic Valve: The pulmonic valve was grossly normal. Pulmonic valve regurgitation is trivial. Aorta: The aortic root and ascending aorta are structurally normal, with no evidence of dilitation. IAS/Shunts: No atrial level shunt detected by color flow Doppler.  LEFT VENTRICLE PLAX 2D LVIDd:         4.20 cm   Diastology LVIDs:         2.90 cm   LV e' medial:    8.27 cm/s LV PW:         1.00 cm   LV E/e' medial:  12.2 LV IVS:        1.10 cm   LV e' lateral:   12.90 cm/s LVOT diam:     1.90 cm   LV E/e' lateral: 7.8 LV SV:         80 LV SV Index:   41 LVOT Area:     2.84 cm                           3D Volume EF:                          3D EF:        59 %                          LV EDV:       135 ml                          LV ESV:       55 ml                          LV SV:        80 ml RIGHT VENTRICLE RV S prime:     12.60 cm/s TAPSE (M-mode): 2.6 cm LEFT ATRIUM           Index        RIGHT ATRIUM           Index LA diam:      4.10 cm 2.12 cm/m   RA Area:     15.30 cm LA Vol (A4C): 47.1 ml 24.35 ml/m  RA Volume:   36.90 ml  19.07 ml/m  AORTIC VALVE LVOT Vmax:   121.00 cm/s LVOT Vmean:  90.800 cm/s LVOT VTI:    0.283 m  AORTA Ao Root diam: 2.70 cm Ao Asc diam:  3.40 cm MITRAL VALVE                TRICUSPID VALVE MV Area (PHT): 3.68 cm     TR Peak grad:   20.8 mmHg MV Decel Time: 206 msec     TR Vmax:        228.00 cm/s MR  Peak grad: 109.8 mmHg MR Vmax:      524.00 cm/s   SHUNTS MV E velocity: 101.00 cm/s  Systemic VTI:  0.28 m MV A velocity: 113.00 cm/s  Systemic Diam: 1.90 cm MV E/A ratio:  0.89 Mihai Croitoru MD Electronically signed by Thurmon Fair MD Signature Date/Time: 10/28/2021/1:21:34 PM    Final    MR BRAIN WO CONTRAST  Result Date: 10/28/2021 CLINICAL DATA:  61 year old female status post syncopal episode. Chest pain. EXAM: MRI HEAD WITHOUT CONTRAST TECHNIQUE: Multiplanar, multiecho pulse sequences of the brain and surrounding structures were obtained without intravenous contrast. COMPARISON:  None Available. FINDINGS: Brain: No restricted diffusion to suggest acute infarction. No midline shift, mass effect, evidence of mass lesion, ventriculomegaly, extra-axial collection or acute intracranial hemorrhage. Cervicomedullary junction and pituitary are within normal limits. Largely normal for age gray and white matter signal throughout the brain. Subtle chronic right cerebellar infarct is possible on series 10, image 5. No cortical encephalomalacia or chronic cerebral blood products. Vascular: Major intracranial vascular flow voids are preserved. The left ICA siphon appears somewhat dominant. Skull and upper cervical spine: Negative for age visible cervical spine. Visualized bone marrow signal is within normal limits. Sinuses/Orbits: Negative. Other: Mastoids are clear. Grossly normal visible internal auditory structures. Negative visible scalp and face. IMPRESSION: No acute intracranial abnormality and essentially normal for age noncontrast MRI appearance of the brain. Electronically Signed   By: Odessa Fleming M.D.   On: 10/28/2021 04:39   CT Angio Chest PE W and/or Wo Contrast  Result Date: 10/27/2021 CLINICAL DATA:  Pulmonary embolism (PE) suspected, positive D-dimer Chest pain.  Syncopal episode. EXAM: CT ANGIOGRAPHY CHEST WITH CONTRAST TECHNIQUE: Multidetector CT imaging of the chest was performed using the standard  protocol during bolus administration of intravenous contrast. Multiplanar CT image reconstructions and MIPs were obtained to evaluate the vascular anatomy. RADIATION DOSE REDUCTION: This exam was performed according to the departmental dose-optimization program which includes automated exposure control, adjustment of the mA and/or kV according to patient size and/or use of iterative reconstruction technique. CONTRAST:  OMNIPAQUE IOHEXOL 350 MG/ML SOLN COMPARISON:  Radiograph earlier today.  Coronary CT 09/08/2021 FINDINGS: Cardiovascular: There are no filling defects within the pulmonary arteries to suggest pulmonary embolus. The heart is normal in size. Mild aortic tortuosity, no aneurysm. Minimal aortic atherosclerosis. The left vertebral artery arises directly from the thoracic aorta, variant arch anatomy. There are coronary artery calcifications. No pericardial effusion. Mediastinum/Nodes: No mediastinal adenopathy or mass. No hilar adenopathy. No esophageal wall thickening. No visualized thyroid nodule. Lungs/Pleura: Mild hypoventilatory changes in the dependent lungs. Subsegmental atelectasis in the right and left lower lobe. No confluent consolidation. No pleural effusion. No features of pulmonary edema trachea and central bronchi are patent. Upper Abdomen: Gastric band in place. No acute upper abdominal findings. Musculoskeletal: Thoracic spondylosis. There are no acute or suspicious osseous abnormalities. No chest wall soft tissue abnormalities. Review of the MIP images confirms the above findings. IMPRESSION: 1. No pulmonary embolus or acute intrathoracic abnormality. 2. Coronary artery calcifications. Aortic Atherosclerosis (ICD10-I70.0). Electronically Signed   By: Narda Rutherford M.D.   On: 10/27/2021 21:08   DG Chest Portable 1 View  Result Date: 10/27/2021 CLINICAL DATA:  Hypotension, syncope, loss of consciousness EXAM: PORTABLE CHEST 1 VIEW COMPARISON:  08/16/2021 FINDINGS: The heart size  and mediastinal contours are within normal limits. Both lungs are clear. The visualized skeletal structures are unremarkable. IMPRESSION: No active disease. Electronically Signed   By: Sharlet Salina M.D.   On: 10/27/2021 18:48     Assessment and Plan:   Chest discomfort Syncope/unclear loss of consciousness -we reviewed her workup at length. Overall her hsTn are very reassuring. We  discussed that these are very sensitive, and given the time period, if this was a cardiac event we would expect to see them rise. Her echo is also unchanged. -leading up to the chest pain, she had not eaten well the day prior and had not eaten at all on that day. She also endorses some intermittent feelings of food getting stuck -we reviewed that esophageal spasm and ischemia can be very similar and also both respond to nitroglycerin.  -I suspect the lightheadedness was due to low blood pressure from the imdur, as well as poor PO intake that day. This has since resolved. Of note, her imdur was discontinued at her visit on 09/09/21 with Gillian Shields, but she took today given the pain. Has not been taking routinely. Would not use. -we reviewed her nonobstructive CAD on her coronary CT -we discussed possibility of coronary spasm, though I would still expect high sensitivity troponins to be at least slightly abnormal if this was the cause.  Overall this does not suggest cardiac etiology for her symptoms. If they recur, would consider GI evaluation given her history of gastric lap band and intermittent issues with dysphagia.   Risk Assessment/Risk Scores:            [facility] will sign off.   Medication Recommendations:  no change Other recommendations (labs, testing, etc):  none Follow up as an outpatient:  Keep appt with me 11/10/21  For questions or updates, please contact [facility] Please consult [email or web address] for contact info under    Signed, Jodelle Red, MD  10/28/2021 4:54 PM

## 2021-10-28 NOTE — ED Notes (Signed)
Per 3E they are ready to receive pt.

## 2021-10-28 NOTE — ED Notes (Signed)
Pt walked to restroom with RN at standby, pt walked independently without any new complaints.

## 2021-10-28 NOTE — Progress Notes (Signed)
OT Cancellation Note  Patient Details Name: Samantha Molina MRN: [id number] DOB: [birth year]   Cancelled Treatment:    Reason Eval/Treat Not Completed: OT screened, no needs identified, will sign off (Per PT, pt does not have acute OT needs. Thank you.)  [name] 10/28/2021, 5:13 PM

## 2021-10-28 NOTE — Consult Note (Signed)
   [facility] Inpatient Consult   10/28/2021  Keelynn Furgerson Sep 13, 1960 [id number]   [facility] Organization [ACO] Patient: [facility] Employee Plan  Primary Care Provider:  Donald Prose, MD is an Embedded provider with [facility] at [place] for admission for post hospital support needs in the CHEP. 1:00pm Met with with patient at the bedside and explained [facility] potential follow up for post hospital needs.  SDOH: denies any issues with transportation, food insecurity or medications cost or management.     Plan: Continue to follow and referral request follow up as needed.   For additional questions or referrals please contact:   Natividad Brood, RN BSN [facility] Liaison  [phone] business mobile phone Toll free office [phone]  Fax number: [phone] [email or web address]

## 2021-10-28 NOTE — Progress Notes (Signed)
  Echocardiogram 2D Echocardiogram has been performed.  Samantha Molina 10/28/2021, 11:03 AM

## 2021-10-29 ENCOUNTER — Other Ambulatory Visit (HOSPITAL_COMMUNITY): Payer: Self-pay

## 2021-10-29 ENCOUNTER — Encounter (HOSPITAL_COMMUNITY): Payer: Self-pay | Admitting: Internal Medicine

## 2021-10-29 DIAGNOSIS — E876 Hypokalemia: Secondary | ICD-10-CM | POA: Diagnosis not present

## 2021-10-29 DIAGNOSIS — Z7982 Long term (current) use of aspirin: Secondary | ICD-10-CM | POA: Diagnosis not present

## 2021-10-29 DIAGNOSIS — R079 Chest pain, unspecified: Secondary | ICD-10-CM | POA: Diagnosis not present

## 2021-10-29 DIAGNOSIS — Z79899 Other long term (current) drug therapy: Secondary | ICD-10-CM | POA: Diagnosis not present

## 2021-10-29 DIAGNOSIS — Z20822 Contact with and (suspected) exposure to covid-19: Secondary | ICD-10-CM | POA: Diagnosis not present

## 2021-10-29 DIAGNOSIS — R55 Syncope and collapse: Secondary | ICD-10-CM | POA: Diagnosis not present

## 2021-10-29 DIAGNOSIS — I10 Essential (primary) hypertension: Secondary | ICD-10-CM | POA: Diagnosis not present

## 2021-10-29 LAB — GLUCOSE, CAPILLARY
Glucose-Capillary: 96 mg/dL (ref 70–99)
Glucose-Capillary: 74 mg/dL (ref 70–99)

## 2021-10-29 LAB — BASIC METABOLIC PANEL
Anion gap: 3 — ABNORMAL LOW (ref 5–15)
BUN: 10 mg/dL (ref 8–23)
CO2: 22 mmol/L (ref 22–32)
Calcium: 8.9 mg/dL (ref 8.9–10.3)
Chloride: 116 mmol/L — ABNORMAL HIGH (ref 98–111)
Creatinine, Ser: 0.66 mg/dL (ref 0.44–1.00)
GFR, Estimated: >60 mL/min (ref >60)
Glucose, Bld: 97 mg/dL (ref 70–99)
Potassium: 4.3 mmol/L (ref 3.5–5.1)
Sodium: 141 mmol/L (ref 135–145)

## 2021-10-29 LAB — MAGNESIUM: Magnesium: 2.2 mg/dL (ref 1.7–2.4)

## 2021-10-29 MED ORDER — AMLODIPINE BESYLATE 5 MG PO TABS
5.0000 mg | ORAL_TABLET | Freq: Every day | ORAL | 1 refills | Status: AC
  Filled 2021-10-29: qty 90, 90d supply, fill #0
  Filled 2021-10-29: qty 90, 180d supply, fill #0
  Filled 2022-02-03 – 2022-05-02 (×5): qty 90, 90d supply, fill #1

## 2021-10-29 MED ORDER — LISINOPRIL-HYDROCHLOROTHIAZIDE 20-25 MG PO TABS
1.0000 | ORAL_TABLET | Freq: Every day | ORAL | 0 refills | Status: DC
Start: 2021-10-29 — End: 2022-03-17
  Filled 2021-10-29 – 2021-12-03 (×2): qty 90, 90d supply, fill #0

## 2021-10-29 NOTE — Progress Notes (Signed)
[facility] Progress Note  Patient: Samantha Molina     TMH:[id number]  DOA: 10/27/2021   PCP: Deatra James, MD       Brief hospital course:    This is a 61 y/o female with DM2, MVP, non obstructive CAD, obesity s/p lap band surgery, depression/anxiety who presents to the hospital for chest pain. She describes her pain as sharp, intermittent and radiating to her left jaw. She took and Imdur (which she is no longer prescribed) but it did not help much. When she was at work, sitting a her work station in the late afternoon, she began to get lightheaded and then had a syncopal episode. She was brought to the ED.  BP in the ED 84/60, K 2.9.  Given IVF with improvement in BP.  CTA unrevealing.  MRI brain showed no abnormalities.   Subjective:  Has some soreness in her chest. Felt a little lightheaded when walking to the sink.   Assessment and Plan: Principal Problem:   Syncope - Likely due to hypotension due to Imdur taken in addition to her other antihypertensive- she states her normal BP is about 110 systolic - ECHO and MRI are unremarkable  Active Problems:   Chest pain with hx of nonocclusive CAD  - has had a cardiac work up in the past - cardiology again consulted and did not feel this was cardiac - no reproducible pain when palpating chest wall - did not seem to be consistent with anxiety - I have discussed with her that GI issues are a very common cause for chest pain and she may consider starting a PPI after discussion with her PCO    Hypokalemia - K 2.9 in setting of HCTZ use - replacing - follow  Migraine Headaches - cont Topamax      Code Status: Full Code  Consultants: cardiolgogy  Level of Care: Level of care: Telemetry Cardiac Disposition Plan:  observation  Objective:   Vitals:   10/28/21 1403 10/28/21 1923 10/29/21 0632 10/29/21 1236  BP: 123/64 106/68 112/62 125/74  Pulse: 74 74 74 68  Resp:  18  18  Temp:  98.4 F (36.9 C) 98.4 F (36.9  C) 97.8 F (36.6 C)  TempSrc:  Oral Oral Oral  SpO2: 100% 99%  100%  Weight:   85.1 kg   Height:       Filed Weights   10/28/21 0254 10/28/21 0527 10/29/21 9211  Weight: 83.9 kg 83.9 kg 85.1 kg   Exam: General exam: Appears comfortable  HEENT: PERRLA, oral mucosa moist, no sclera icterus or thrush Respiratory system: Clear to auscultation. Respiratory effort normal. Cardiovascular system: S1 & S2 heard, regular rate and rhythm Gastrointestinal system: Abdomen soft, non-tender, nondistended. Normal bowel sounds   Central nervous system: Alert and oriented. No focal neurological deficits. Extremities: No cyanosis, clubbing or edema Skin: No rashes or ulcers Psychiatry:  Mood & affect appropriate.    Imaging and lab data was personally reviewed    CBC: Recent Labs  Lab 10/27/21 1840  WBC 6.6  NEUTROABS 3.7  HGB 11.9*  HCT 38.6  MCV 76.7*  PLT 429*   Basic Metabolic Panel: Recent Labs  Lab 10/27/21 1840 10/27/21 2025 10/28/21 1437 10/29/21 0738  NA 139  --  140 141  K 2.9*  --  3.1* 4.3  CL 106  --  111 116*  CO2 25  --  25 22  GLUCOSE 92  --  88 97  BUN 21  --  12 10  CREATININE 1.09*  --  0.73 0.66  CALCIUM 9.7  --  9.1 8.9  MG  --  2.0  --  2.2   GFR: Estimated Creatinine Clearance: 81.1 mL/min (by C-G formula based on SCr of 0.66 mg/dL).  Scheduled Meds:  aspirin  81 mg Oral Daily   atorvastatin  10 mg Oral Daily   heparin  5,000 Units Subcutaneous Q8H   sodium chloride flush  3 mL Intravenous Q12H   Continuous Infusions:   LOS: 2 days   Author: Calvert Cantor  10/28/2021 4:30 PM

## 2021-10-29 NOTE — Plan of Care (Signed)

## 2021-10-30 NOTE — Discharge Summary (Signed)
Physician Discharge Summary  Samantha Molina [id number] DOB: [birth year] DOA: 10/27/2021  PCP: Donald Prose, MD  Admit date: 10/27/2021 Discharge date: 10/29/21 Admitted From: Home Disposition: Home  Recommendations for Outpatient Follow-up:  Follow up with PCP in 1-2 weeks Please obtain BMP/CBC in one week Please follow up with cardiology  Home Health: None Equipment/Devices: None  Discharge Condition: Stable CODE STATUS:full Diet recommendation: Heart healthy Brief/Interim Summary: 61 y/o female with DM2, MVP, non obstructive CAD, obesity s/p lap band surgery, depression/anxiety who presents to the hospital for chest pain. She describes her pain as sharp, intermittent and radiating to her left jaw. She took and Imdur (which she is no longer prescribed) but it did not help much. When she was at work, sitting a her work station in the late afternoon, she began to get lightheaded and then had a syncopal episode. She was brought to the ED.  BP in the ED 84/60, K 2.9.  Given IVF with improvement in BP.  CTA unrevealing.  MRI brain showed no abnormalities Discharge Diagnoses:  Principal Problem:   Syncope Active Problems:   Chest pain with hx of nonocclusive CAD    Hypokalemia   Essential hypertension  Syncope-patient is unclear about the loss of consciousness.  She was seen by Dr. Harrell Gave from cardiology who is her primary cardiologist.  Her troponins remained flat. She had complaints of food getting stuck intermittently while swallowing.  I did discuss with her to follow-up with her GI. She probably was lightheaded from low blood pressure from taking Imdur.  Imdur was stopped prior to admission to the hospital by her PCP. - ECHO and MRI are unremarkable   Active Problems: Atypical chest pain with hx of nonocclusive CAD -I encouraged her to follow-up with GI for possible spasm of the esophagus/GERD.     Hypokalemia-potassium was 4.3 at the time of discharge.   Migraine  Headaches - cont Topamax  Estimated body mass index is 30.28 kg/m as calculated from the following:   Height as of this encounter: 5\' 6"  (1.676 m).   Weight as of this encounter: 85.1 kg.  Discharge Instructions  Discharge Instructions     Diet - low sodium heart healthy   Complete by: As directed    Increase activity slowly   Complete by: As directed       Allergies as of 10/29/2021       Reactions   Isosorbide Mononitrate [isosorbide Nitrate] Other (See Comments)   Headache        Medication List     TAKE these medications    acetaminophen 650 MG CR tablet Commonly known as: TYLENOL Take 650 mg by mouth every 8 (eight) hours as needed for pain.   ALPRAZolam 0.25 MG tablet Commonly known as: Xanax Take 0.5-1 tablets (0.125-0.25 mg total) by mouth daily as needed. What changed: reasons to take this   amLODipine 5 MG tablet Commonly known as: NORVASC Take 1 tablet (5 mg total) by mouth daily. What changed:  medication strength how much to take   aspirin 81 MG chewable tablet Chew 1 tablet (81 mg total) by mouth daily.   atorvastatin 10 MG tablet Commonly known as: LIPITOR Take 1 tablet (10 mg total) by mouth daily.   cephALEXin 250 MG capsule Commonly known as: KEFLEX Take 1 capsule (250 mg total) by mouth daily.   desvenlafaxine 50 MG 24 hr tablet Commonly known as: Pristiq Take 1 tablet (50 mg total) by mouth daily.   lisinopril-hydrochlorothiazide 20-25  MG tablet Commonly known as: ZESTORETIC Take 1 tablet by mouth daily.   multivitamin with minerals tablet Take 1 tablet by mouth daily.   oxybutynin 10 MG 24 hr tablet Commonly known as: DITROPAN-XL Take 1 tablet (10 mg total) by mouth at bedtime.   oxyCODONE-acetaminophen 10-325 MG tablet Commonly known as: Percocet Take 1 tablet by mouth every 8 hours as needed What changed:  how much to take how to take this when to take this reasons to take this Another medication with the same  name was removed. Continue taking this medication, and follow the directions you see here.   topiramate 100 MG tablet Commonly known as: Topamax Take 1 tablet (100 mg total) by mouth 2 (two) times daily.        Allergies  Allergen Reactions   Isosorbide Mononitrate [Isosorbide Nitrate] Other (See Comments)    Headache    Consultations: Cardiology   Procedures/Studies: ECHOCARDIOGRAM COMPLETE  Result Date: 10/28/2021    ECHOCARDIOGRAM REPORT   Patient Name:   Samantha Molina [facility] Date of Exam: 10/28/2021 Medical Rec #:  [id number]           Height:       66.0 in Accession #:    [id number]          Weight:       185.0 lb Date of Birth:  May 15, 1960           BSA:          1.935 m Patient Age:    61 years            BP:           100/58 mmHg Patient Gender: F                   HR:           65 bpm. Exam Location:  Inpatient Procedure: 2D Echo, 3D Echo, Cardiac Doppler and Color Doppler Indications:    Syncope  History:        Patient has prior history of Echocardiogram examinations, most                 recent 08/16/2021. CAD, Signs/Symptoms:Murmur; Risk                 Factors:Hypertension, Diabetes and obesity.  Sonographer:    Milda Smart Referring Phys: [id number] SAIMA RIZWAN IMPRESSIONS  1. Left ventricular ejection fraction, by estimation, is 60 to 65%. Left ventricular ejection fraction by 3D volume is 59 %. The left ventricle has normal function. The left ventricle has no regional wall motion abnormalities. Left ventricular diastolic  parameters were normal.  2. Right ventricular systolic function is normal. The right ventricular size is normal. There is normal pulmonary artery systolic pressure. The estimated right ventricular systolic pressure is 23.8 mmHg.  3. Left atrial size was mildly dilated.  4. 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 Mild to moderate mitral valve regurgitation.  5. The aortic valve is tricuspid. There is mild calcification of the aortic valve. Aortic valve regurgitation is not  visualized. Aortic valve sclerosis is present, with no evidence of aortic valve stenosis. FINDINGS  Left Ventricle: Left ventricular ejection fraction, by estimation, is 60 to 65%. Left ventricular ejection fraction by 3D volume is 59 %. The left ventricle has normal function. The left ventricle has no regional wall motion abnormalities. The left ventricular internal cavity size was normal in size. There is no left ventricular hypertrophy. Left ventricular diastolic  parameters were normal. Normal left ventricular filling pressure. Right Ventricle: The right ventricular size is normal. No increase in right ventricular wall thickness. Right ventricular systolic function is normal. There is normal pulmonary artery systolic pressure. The tricuspid regurgitant velocity is 2.28 m/s, and  with an assumed right atrial pressure of 3 mmHg, the estimated right ventricular systolic pressure is [id number]. Left Atrium: Left atrial size was mildly dilated. Right Atrium: Right atrial size was normal in size. Pericardium: There is no evidence of pericardial effusion. Mitral Valve: The mitral valve is myxomatous. Mild to moderate mitral valve regurgitation, with centrally-directed jet. Tricuspid Valve: The tricuspid valve is normal in structure. Tricuspid valve regurgitation is trivial. Aortic Valve: The aortic valve is tricuspid. There is mild calcification of the aortic valve. Aortic valve regurgitation is not visualized. Aortic valve sclerosis is present, with no evidence of aortic valve stenosis. Pulmonic Valve: The pulmonic valve was grossly normal. Pulmonic valve regurgitation is trivial. Aorta: The aortic root and ascending aorta are structurally normal, with no evidence of dilitation. IAS/Shunts: No atrial level shunt detected by color flow Doppler.  LEFT VENTRICLE PLAX 2D LVIDd:         4.20 cm   Diastology LVIDs:         2.90 cm   LV e' medial:    8.27 cm/s LV PW:         1.00 cm   LV E/e' medial:  12.2 LV IVS:        1.10  cm   LV e' lateral:   12.90 cm/s LVOT diam:     1.90 cm   LV E/e' lateral: 7.8 LV SV:         80 LV SV Index:   41 LVOT Area:     2.84 cm                           3D Volume EF:                          3D EF:        59 %                          LV EDV:       135 ml                          LV ESV:       55 ml                          LV SV:        80 ml RIGHT VENTRICLE RV S prime:     12.60 cm/s TAPSE (M-mode): 2.6 cm LEFT ATRIUM           Index        RIGHT ATRIUM           Index LA diam:      4.10 cm 2.12 cm/m   RA Area:     15.30 cm LA Vol (A4C): 47.1 ml 24.35 ml/m  RA Volume:   36.90 ml  19.07 ml/m  AORTIC VALVE LVOT Vmax:   121.00 cm/s LVOT Vmean:  90.800 cm/s LVOT VTI:    0.283 m  AORTA Ao Root diam: 2.70 cm Ao Asc diam:  3.40 cm MITRAL  VALVE                TRICUSPID VALVE MV Area (PHT): 3.68 cm     TR Peak grad:   20.8 mmHg MV Decel Time: 206 msec     TR Vmax:        228.00 cm/s MR Peak grad: 109.8 mmHg MR Vmax:      524.00 cm/s   SHUNTS MV E velocity: 101.00 cm/s  Systemic VTI:  0.28 m MV A velocity: 113.00 cm/s  Systemic Diam: 1.90 cm MV E/A ratio:  0.89 Mihai Croitoru MD Electronically signed by Sanda Klein MD Signature Date/Time: 10/28/2021/1:21:34 PM    Final    MR BRAIN WO CONTRAST  Result Date: 10/28/2021 CLINICAL DATA:  61 year old female status post syncopal episode. Chest pain. EXAM: MRI HEAD WITHOUT CONTRAST TECHNIQUE: Multiplanar, multiecho pulse sequences of the brain and surrounding structures were obtained without intravenous contrast. COMPARISON:  None Available. FINDINGS: Brain: No restricted diffusion to suggest acute infarction. No midline shift, mass effect, evidence of mass lesion, ventriculomegaly, extra-axial collection or acute intracranial hemorrhage. Cervicomedullary junction and pituitary are within normal limits. Largely normal for age gray and white matter signal throughout the brain. Subtle chronic right cerebellar infarct is possible on series 10, image 5. No  cortical encephalomalacia or chronic cerebral blood products. Vascular: Major intracranial vascular flow voids are preserved. The left ICA siphon appears somewhat dominant. Skull and upper cervical spine: Negative for age visible cervical spine. Visualized bone marrow signal is within normal limits. Sinuses/Orbits: Negative. Other: Mastoids are clear. Grossly normal visible internal auditory structures. Negative visible scalp and face. IMPRESSION: No acute intracranial abnormality and essentially normal for age noncontrast MRI appearance of the brain. Electronically Signed   By: Genevie Ann M.D.   On: 10/28/2021 04:39   CT Angio Chest PE W and/or Wo Contrast  Result Date: 10/27/2021 CLINICAL DATA:  Pulmonary embolism (PE) suspected, positive D-dimer Chest pain.  Syncopal episode. EXAM: CT ANGIOGRAPHY CHEST WITH CONTRAST TECHNIQUE: Multidetector CT imaging of the chest was performed using the standard protocol during bolus administration of intravenous contrast. Multiplanar CT image reconstructions and MIPs were obtained to evaluate the vascular anatomy. RADIATION DOSE REDUCTION: This exam was performed according to the departmental dose-optimization program which includes automated exposure control, adjustment of the mA and/or kV according to patient size and/or use of iterative reconstruction technique. CONTRAST:  131mL OMNIPAQUE IOHEXOL 350 MG/ML SOLN COMPARISON:  Radiograph earlier today.  Coronary CT 09/08/2021 FINDINGS: Cardiovascular: There are no filling defects within the pulmonary arteries to suggest pulmonary embolus. The heart is normal in size. Mild aortic tortuosity, no aneurysm. Minimal aortic atherosclerosis. The left vertebral artery arises directly from the thoracic aorta, variant arch anatomy. There are coronary artery calcifications. No pericardial effusion. Mediastinum/Nodes: No mediastinal adenopathy or mass. No hilar adenopathy. No esophageal wall thickening. No visualized thyroid nodule.  Lungs/Pleura: Mild hypoventilatory changes in the dependent lungs. Subsegmental atelectasis in the right and left lower lobe. No confluent consolidation. No pleural effusion. No features of pulmonary edema trachea and central bronchi are patent. Upper Abdomen: Gastric band in place. No acute upper abdominal findings. Musculoskeletal: Thoracic spondylosis. There are no acute or suspicious osseous abnormalities. No chest wall soft tissue abnormalities. Review of the MIP images confirms the above findings. IMPRESSION: 1. No pulmonary embolus or acute intrathoracic abnormality. 2. Coronary artery calcifications. Aortic Atherosclerosis (ICD10-I70.0). Electronically Signed   By: Keith Rake M.D.   On: 10/27/2021 21:08   DG Chest Portable 1  View  Result Date: 10/27/2021 CLINICAL DATA:  Hypotension, syncope, loss of consciousness EXAM: PORTABLE CHEST 1 VIEW COMPARISON:  08/16/2021 FINDINGS: The heart size and mediastinal contours are within normal limits. Both lungs are clear. The visualized skeletal structures are unremarkable. IMPRESSION: No active disease. Electronically Signed   By: Randa Ngo M.D.   On: 10/27/2021 18:48   (Echo, Carotid, EGD, Colonoscopy, ERCP)    Subjective: Patient is resting in bed anxious to go home no new complaints ambulated in the hallway  Discharge Exam: Vitals:   10/29/21 0632 10/29/21 1236  BP: 112/62 125/74  Pulse: 74 68  Resp:  18  Temp: 98.4 F (36.9 C) 97.8 F (36.6 C)  SpO2:  100%   Vitals:   10/28/21 1403 10/28/21 1923 10/29/21 0632 10/29/21 1236  BP: 123/64 106/68 112/62 125/74  Pulse: 74 74 74 68  Resp:  18  18  Temp:  98.4 F (36.9 C) 98.4 F (36.9 C) 97.8 F (36.6 C)  TempSrc:  Oral Oral Oral  SpO2: 100% 99%  100%  Weight:   85.1 kg   Height:        General: Pt is alert, awake, not in acute distress Cardiovascular: RRR, S1/S2 +, no rubs, no gallops Respiratory: CTA bilaterally, no wheezing, no rhonchi Abdominal: Soft, NT, ND, bowel  sounds + Extremities: no edema, no cyanosis    The results of significant diagnostics from this hospitalization (including imaging, microbiology, ancillary and laboratory) are listed below for reference.     Microbiology: Recent Results (from the past 240 hour(s))  SARS Coronavirus 2 by RT PCR (hospital order, performed in [facility] hospital lab) *cepheid single result test* Anterior Nasal Swab     Status: None   Collection Time: 10/27/21  6:29 PM   Specimen: Anterior Nasal Swab  Result Value Ref Range Status   SARS Coronavirus 2 by RT PCR NEGATIVE NEGATIVE Final    Comment: (NOTE) SARS-CoV-2 target nucleic acids are NOT DETECTED.  The SARS-CoV-2 RNA is generally detectable in upper and lower respiratory specimens during the acute phase of infection. The lowest concentration of SARS-CoV-2 viral copies this assay can detect is 250 copies / mL. A negative result does not preclude SARS-CoV-2 infection and should not be used as the sole basis for treatment or other patient management decisions.  A negative result may occur with improper specimen collection / handling, submission of specimen other than nasopharyngeal swab, presence of viral mutation(s) within the areas targeted by this assay, and inadequate number of viral copies (<250 copies / mL). A negative result must be combined with clinical observations, patient history, and epidemiological information.  Fact Sheet for Patients:   https://www.patel.info/  Fact Sheet for Healthcare Providers: https://hall.com/  This test is not yet approved or  cleared by the [place] FDA and has been authorized for detection and/or diagnosis of SARS-CoV-2 by FDA under an Emergency Use Authorization (EUA).  This EUA will remain in effect (meaning this test can be used) for the duration of the COVID-19 declaration under Section 564(b)(1) of the Act, 21 U.S.C. section 360bbb-3(b)(1), unless the  authorization is terminated or revoked sooner.  Performed at [facility], [place] [address] [place], [place] [address]      Labs: BNP (last 3 results) No results for input(s): "BNP" in the last 8760 hours. Basic Metabolic Panel: Recent Labs  Lab 10/27/21 1840 10/27/21 2025 10/28/21 1437 10/29/21 0738  NA 139  --  140 141  K 2.9*  --  3.1* 4.3  CL 106  --  111 116*  CO2 25  --  25 22  GLUCOSE 92  --  88 97  BUN 21  --  12 10  CREATININE 1.09*  --  0.73 0.66  CALCIUM 9.7  --  9.1 8.9  MG  --  2.0  --  2.2   Liver Function Tests: No results for input(s): "AST", "ALT", "ALKPHOS", "BILITOT", "PROT", "ALBUMIN" in the last 168 hours. No results for input(s): "LIPASE", "AMYLASE" in the last 168 hours. No results for input(s): "AMMONIA" in the last 168 hours. CBC: Recent Labs  Lab 10/27/21 1840  WBC 6.6  NEUTROABS 3.7  HGB 11.9*  HCT 38.6  MCV 76.7*  PLT 429*   Cardiac Enzymes: No results for input(s): "CKTOTAL", "CKMB", "CKMBINDEX", "TROPONINI" in the last 168 hours. BNP: Invalid input(s): "POCBNP" CBG: Recent Labs  Lab 10/28/21 0614 10/28/21 1102 10/28/21 1623 10/29/21 0634 10/29/21 1149  GLUCAP 83 76 100* 96 74   D-Dimer Recent Labs    10/27/21 1840  DDIMER 0.91*   Hgb A1c No results for input(s): "HGBA1C" in the last 72 hours. Lipid Profile No results for input(s): "CHOL", "HDL", "LDLCALC", "TRIG", "CHOLHDL", "LDLDIRECT" in the last 72 hours. Thyroid function studies No results for input(s): "TSH", "T4TOTAL", "T3FREE", "THYROIDAB" in the last 72 hours.  Invalid input(s): "FREET3" Anemia work up No results for input(s): "VITAMINB12", "FOLATE", "FERRITIN", "TIBC", "IRON", "RETICCTPCT" in the last 72 hours. Urinalysis    Component Value Date/Time   COLORURINE YELLOW 10/28/2021 0920   APPEARANCEUR HAZY (A) 10/28/2021 0920   LABSPEC 1.018 10/28/2021 0920   PHURINE 7.0 10/28/2021 0920   GLUCOSEU NEGATIVE 10/28/2021 0920   HGBUR NEGATIVE  10/28/2021 0920   BILIRUBINUR NEGATIVE 10/28/2021 0920   KETONESUR NEGATIVE 10/28/2021 0920   PROTEINUR NEGATIVE 10/28/2021 0920   UROBILINOGEN 1.0 01/18/2010 1310   NITRITE POSITIVE (A) 10/28/2021 0920   LEUKOCYTESUR TRACE (A) 10/28/2021 0920   Sepsis Labs Recent Labs  Lab 10/27/21 1840  WBC 6.6   Microbiology Recent Results (from the past 240 hour(s))  SARS Coronavirus 2 by RT PCR (hospital order, performed in [facility] hospital lab) *cepheid single result test* Anterior Nasal Swab     Status: None   Collection Time: 10/27/21  6:29 PM   Specimen: Anterior Nasal Swab  Result Value Ref Range Status   SARS Coronavirus 2 by RT PCR NEGATIVE NEGATIVE Final    Comment: (NOTE) SARS-CoV-2 target nucleic acids are NOT DETECTED.  The SARS-CoV-2 RNA is generally detectable in upper and lower respiratory specimens during the acute phase of infection. The lowest concentration of SARS-CoV-2 viral copies this assay can detect is 250 copies / mL. A negative result does not preclude SARS-CoV-2 infection and should not be used as the sole basis for treatment or other patient management decisions.  A negative result may occur with improper specimen collection / handling, submission of specimen other than nasopharyngeal swab, presence of viral mutation(s) within the areas targeted by this assay, and inadequate number of viral copies (<250 copies / mL). A negative result must be combined with clinical observations, patient history, and epidemiological information.  Fact Sheet for Patients:   RoadLapTop.co.za  Fact Sheet for Healthcare Providers: http://kim-miller.com/  This test is not yet approved or  cleared by the [place] FDA and has been authorized for detection and/or diagnosis of SARS-CoV-2 by FDA under an Emergency Use Authorization (EUA).  This EUA will remain in effect (meaning this test can  be used) for the duration of  the COVID-19 declaration under Section 564(b)(1) of the Act, 21 U.S.C. section 360bbb-3(b)(1), unless the authorization is terminated or revoked sooner.  Performed at [facility], [place] [address] [place], [place] [address]      Time coordinating discharge: 39 minutes  SIGNED:  Georgette Shell, MD  [facility] 10/30/2021, 3:55 PM

## 2021-10-31 ENCOUNTER — Ambulatory Visit (INDEPENDENT_AMBULATORY_CARE_PROVIDER_SITE_OTHER): Payer: 59 | Admitting: Psychology

## 2021-10-31 DIAGNOSIS — F331 Major depressive disorder, recurrent, moderate: Secondary | ICD-10-CM | POA: Diagnosis not present

## 2021-10-31 NOTE — Progress Notes (Signed)
[facility] Counselor/Therapist Progress Note  Patient ID: Samantha Molina, MRN: [id number],    Date: 10/31/2021  Time Spent: 11:00am-11:50am    50 minutes   Treatment Type: Individual Therapy  Reported Symptoms: stress  Mental Status Exam: Appearance:  Casual     Behavior: Appropriate  Motor: Normal  Speech/Language:  Normal Rate  Affect: Appropriate  Mood: normal  Thought process: normal  Thought content:   WNL  Sensory/Perceptual disturbances:   WNL  Orientation: oriented to person, place, time/date, and situation  Attention: Good  Concentration: Good  Memory: WNL  Fund of knowledge:  Good  Insight:   Good  Judgment:  Good  Impulse Control: Good   Risk Assessment: Danger to Self:  No Self-injurious Behavior: No Danger to Others: No Duty to Warn:no Physical Aggression / Violence:No  Access to Firearms a concern: No  Gang Involvement:No   Subjective: Pt Samantha Molina present for face-to-face individual therapy via video Webex.  Pt consents to telehealth video session due to COVID 19 pandemic. Location of pt: home Location of therapist: home office.  Pt talked about her health.  She was in the hospital for a few days this week bc she passed out at work.  Pt was having chest pain and jaw pain before she passed out.   Pt's testing showed she was low in potassium and her blood pressure was very low.  Pt was dehydrated and wasn't eating enough.   This was scary for pt.  It made her think about how short life is and she plans to retire in March when she turns 76.  Pt talked about work.    There has been a lot of stress at work.  They changed her work space to an area that is much more crowded.  This is overwhelming for pt. Worked on [employer], [employer].  Pt talked about having conversations with her husband while they were on vacation.   Addressed their interactions.   Helped pt process her feelings and relationship dynamics.   Worked on self care strategies.    Provided supportive therapy.    Interventions: Cognitive Behavioral Therapy and Insight-Oriented  Diagnosis: F33.1  Plan: See pt's Treatment Plan for depression in Therapy Charts.  (Treatment Plan Target Date: 02/17/2022) Pt is progressing toward treatment goals.   Plan to continue to see pt every two weeks.    Pluma Diniz, LCSW

## 2021-11-03 ENCOUNTER — Encounter (HOSPITAL_BASED_OUTPATIENT_CLINIC_OR_DEPARTMENT_OTHER): Payer: Self-pay | Admitting: *Deleted

## 2021-11-03 DIAGNOSIS — H35039 Hypertensive retinopathy, unspecified eye: Secondary | ICD-10-CM | POA: Insufficient documentation

## 2021-11-03 DIAGNOSIS — I25118 Atherosclerotic heart disease of native coronary artery with other forms of angina pectoris: Secondary | ICD-10-CM | POA: Insufficient documentation

## 2021-11-03 DIAGNOSIS — I341 Nonrheumatic mitral (valve) prolapse: Secondary | ICD-10-CM | POA: Insufficient documentation

## 2021-11-03 NOTE — Progress Notes (Signed)
[facility] PREP Weekly Session  Patient Details  Name: Samantha Molina MRN: [id number] Date of Birth: 1961/03/10 Age: 61 y.o. PCP: Deatra James, MD  Vitals:   11/03/21 1123  Weight: 189 lb (85.7 kg)     [facility] Weekly seesion - 11/03/21 1100       [facility] "PREP" Location   [facility] "PREP" Location [facility]      Weekly Session   Topic Discussed Healthy eating tips   Introduced YUKA app; water: 1/2 body wt in oz. or 64 oz initially   Classes attended to date 4             Bartow Zylstra B Melvin Whiteford 11/03/2021, 11:24 AM

## 2021-11-04 ENCOUNTER — Other Ambulatory Visit (HOSPITAL_COMMUNITY): Payer: Self-pay

## 2021-11-04 ENCOUNTER — Encounter (HOSPITAL_BASED_OUTPATIENT_CLINIC_OR_DEPARTMENT_OTHER): Payer: Self-pay

## 2021-11-04 DIAGNOSIS — M542 Cervicalgia: Secondary | ICD-10-CM | POA: Diagnosis not present

## 2021-11-04 DIAGNOSIS — M5417 Radiculopathy, lumbosacral region: Secondary | ICD-10-CM | POA: Diagnosis not present

## 2021-11-04 DIAGNOSIS — G43009 Migraine without aura, not intractable, without status migrainosus: Secondary | ICD-10-CM | POA: Diagnosis not present

## 2021-11-04 DIAGNOSIS — Z760 Encounter for issue of repeat prescription: Secondary | ICD-10-CM | POA: Diagnosis not present

## 2021-11-04 DIAGNOSIS — F419 Anxiety disorder, unspecified: Secondary | ICD-10-CM | POA: Diagnosis not present

## 2021-11-04 MED ORDER — OXYCODONE-ACETAMINOPHEN 10-325 MG PO TABS
ORAL_TABLET | ORAL | 0 refills | Status: DC
  Filled 2021-11-04 – 2021-11-07 (×2): qty 90, 30d supply, fill #0

## 2021-11-04 MED ORDER — ASPIRIN 81 MG PO CHEW
81.0000 mg | CHEWABLE_TABLET | Freq: Every day | ORAL | 11 refills | Status: AC
  Filled 2021-11-04: qty 30, 30d supply, fill #0
  Filled 2021-12-07: qty 30, 30d supply, fill #1
  Filled 2022-02-03: qty 30, 30d supply, fill #2
  Filled 2022-03-08: qty 30, 30d supply, fill #3
  Filled 2022-04-18: qty 30, 30d supply, fill #4
  Filled 2022-05-18: qty 30, 30d supply, fill #5
  Filled 2022-06-15: qty 30, 30d supply, fill #6
  Filled 2022-07-18: qty 30, 30d supply, fill #7

## 2021-11-05 ENCOUNTER — Other Ambulatory Visit (HOSPITAL_COMMUNITY): Payer: Self-pay

## 2021-11-05 MED ORDER — ATORVASTATIN CALCIUM 10 MG PO TABS
10.0000 mg | ORAL_TABLET | Freq: Every day | ORAL | 0 refills | Status: DC
  Filled 2021-11-05: qty 90, 90d supply, fill #0

## 2021-11-07 ENCOUNTER — Other Ambulatory Visit (HOSPITAL_COMMUNITY): Payer: Self-pay

## 2021-11-07 DIAGNOSIS — I10 Essential (primary) hypertension: Secondary | ICD-10-CM | POA: Diagnosis not present

## 2021-11-07 DIAGNOSIS — E1169 Type 2 diabetes mellitus with other specified complication: Secondary | ICD-10-CM | POA: Diagnosis not present

## 2021-11-07 DIAGNOSIS — Z23 Encounter for immunization: Secondary | ICD-10-CM | POA: Diagnosis not present

## 2021-11-07 DIAGNOSIS — F411 Generalized anxiety disorder: Secondary | ICD-10-CM | POA: Diagnosis not present

## 2021-11-07 DIAGNOSIS — I700 Atherosclerosis of aorta: Secondary | ICD-10-CM | POA: Diagnosis not present

## 2021-11-07 DIAGNOSIS — K219 Gastro-esophageal reflux disease without esophagitis: Secondary | ICD-10-CM | POA: Diagnosis not present

## 2021-11-07 MED ORDER — AMLODIPINE BESYLATE 5 MG PO TABS
5.0000 mg | ORAL_TABLET | Freq: Once | ORAL | 0 refills | Status: DC
  Filled 2021-11-07 – 2022-02-03 (×2): qty 90, 90d supply, fill #0

## 2021-11-10 ENCOUNTER — Encounter (HOSPITAL_BASED_OUTPATIENT_CLINIC_OR_DEPARTMENT_OTHER): Payer: Self-pay | Admitting: Cardiology

## 2021-11-10 ENCOUNTER — Ambulatory Visit (INDEPENDENT_AMBULATORY_CARE_PROVIDER_SITE_OTHER): Payer: 59 | Admitting: Cardiology

## 2021-11-10 VITALS — BP 130/72 | HR 75 | Ht 66.0 in | Wt 190.0 lb

## 2021-11-10 DIAGNOSIS — E785 Hyperlipidemia, unspecified: Secondary | ICD-10-CM

## 2021-11-10 DIAGNOSIS — I2510 Atherosclerotic heart disease of native coronary artery without angina pectoris: Secondary | ICD-10-CM | POA: Diagnosis not present

## 2021-11-10 DIAGNOSIS — I10 Essential (primary) hypertension: Secondary | ICD-10-CM | POA: Diagnosis not present

## 2021-11-10 DIAGNOSIS — R0789 Other chest pain: Secondary | ICD-10-CM | POA: Diagnosis not present

## 2021-11-10 DIAGNOSIS — Z7189 Other specified counseling: Secondary | ICD-10-CM | POA: Diagnosis not present

## 2021-11-10 DIAGNOSIS — Z09 Encounter for follow-up examination after completed treatment for conditions other than malignant neoplasm: Secondary | ICD-10-CM

## 2021-11-10 NOTE — Patient Instructions (Signed)

## 2021-11-10 NOTE — Progress Notes (Signed)
Cardiology Office Note:    Date:  11/10/2021   ID:  Samantha Molina, DOB 1960-08-03, MRN [id number]  PCP:  Donald Prose, MD  Cardiologist:  Buford Dresser, MD  Referring MD: Donald Prose, MD   CC: Hospital follow-up  History of Present Illness:    Samantha Molina is a 61 y.o. female with a hx of hypertension, depression/anxiety who is seen for follow up. I initially met her 01/09/19 as a new consult at the request of Donald Prose, MD for the evaluation and management of chest pain.  Chest pain history: had full workup and was told everything was fine. Happened when she was living in [place], was 300 lbs at the time. Had lap-band done in 2007, lost a huge amount weight. Has been vigilant about diet and exercise. Had been fine with no further chest pain until most recent episode and then recurrent episode. Occurred 10/28/18, sudden onset around 20 PM. Aching, substernal, associated with shortness of breath and right jaw pain. Seen in ER 10/29/18 when pain had been constant for several hours. D dimer elevated but CTPE negative. hsTn 3 -> 3. ECG nonischemic. CT cardiac with mild nonobstructive disease 02/2019.  At her last appointment, we reviewed the episode that brought her into the ER. Scariest symptom was that she could not catch her breath. Also had odd sensation in her right arm at the time, midsternal chest pain. Workup unrevealing. Was having a stressful time with family issues. We reviewed her prior workup at length. She was encouraged to keep working on re-intensifying diet and exercise.  She followed up with Samantha Montana, NP on 09/09/2021 where they reviewed her cardiac testing. She continued to have a "twinge" in her right chest. Also complained of headache while taking Imdur since discharge, so this was discontinued. She had undergone knee surgery and was given clearance to go back to exercise such as the stairmaster. Started on Toprol 41m QD for optimization of  GDMT.  Today: I recently saw her during her hospitalization, see note. We had discussed her chest discomfort and her reassuring cardiac evaluation. She reports feeling better. She denies any recurring symptoms since being discharged from the hospital.  She continues to participate in PREP program. She is drinking more water and Gatorade. Prior to her workouts at the gym she makes sure to eat something.   Later today she plans to obtain a blood pressure cuff for at home monitoring.  She plans to retire soon.  She denies any palpitations, chest pain, shortness of breath, or peripheral edema. No lightheadedness, headaches, syncope, orthopnea, or PND.   Past Medical History:  Diagnosis Date   Arthritis    Arthritis    both knees   Chronic leg pain    Coronary artery disease    Depression    Diabetes mellitus without complication [place]    Heart murmur    as a child   Hypertension    Migraine without aura    Morbid obesity ([place]    Urinary incontinence    Vitamin D deficiency disease 2013    Past Surgical History:  Procedure Laterality Date   JOINT REPLACEMENT Left 2022   Knee   KNEE ARTHROSCOPY W/ ACL RECONSTRUCTION Left 2004   LAPAROSCOPIC CHOLECYSTECTOMY  2005   LAPAROSCOPIC GASTRIC BANDING  2007   TONSILLECTOMY  1984   Age 61  TOTAL VAGINAL HYSTERECTOMY  age 61  Ovaries remain, for menorrhagia   TUBAL LIGATION      Current  Medications: Current Outpatient Medications on File Prior to Visit  Medication Sig   acetaminophen (TYLENOL) 650 MG CR tablet Take 650 mg by mouth every 8 (eight) hours as needed for pain.   ALPRAZolam (XANAX) 0.25 MG tablet Take 0.5-1 tablets (0.125-0.25 mg total) by mouth daily as needed. (Patient taking differently: Take 0.125-0.25 mg by mouth daily as needed for anxiety.)   amLODipine (NORVASC) 5 MG tablet Take 1 tablet (5 mg total) by mouth daily.   aspirin 81 MG chewable tablet Chew 1 tablet (81 mg total) by mouth daily.   atorvastatin  (LIPITOR) 10 MG tablet Take 1 tablet (10 mg total) by mouth daily.   cephALEXin (KEFLEX) 250 MG capsule Take 1 capsule (250 mg total) by mouth daily.   desvenlafaxine (PRISTIQ) 50 MG 24 hr tablet Take 1 tablet (50 mg total) by mouth daily.   lisinopril-hydrochlorothiazide (ZESTORETIC) 20-25 MG tablet Take 1 tablet by mouth daily.   Multiple Vitamins-Minerals (MULTIVITAMIN WITH MINERALS) tablet Take 1 tablet by mouth daily.   oxybutynin (DITROPAN-XL) 10 MG 24 hr tablet Take 1 tablet (10 mg total) by mouth at bedtime.   oxyCODONE-acetaminophen (PERCOCET) 10-325 MG tablet Take 1 tablet by mouth every 8 hours as needed.   topiramate (TOPAMAX) 100 MG tablet Take 1 tablet (100 mg total) by mouth 2 (two) times daily.   amLODipine (NORVASC) 5 MG tablet Take 1 tablet (5 mg total) by mouth once a day.   No current facility-administered medications on file prior to visit.     Allergies:   Isosorbide mononitrate [isosorbide nitrate]   Social History   Tobacco Use   Smoking status: Never   Smokeless tobacco: Never  Vaping Use   Vaping Use: Never used  Substance Use Topics   Alcohol use: Not Currently    Comment: rarely   Drug use: No    Family History: family history includes Breast cancer (age of onset: 23) in her paternal aunt; Breast cancer (age of onset: 57) in her cousin; Breast cancer (age of onset: 43) in her maternal aunt; Diabetes in her brother and father; Heart disease in her father and mother; Hyperlipidemia in her brother and father; Hypertension in her father and mother; Lupus in her brother, maternal aunt, and mother; Lymphoma in her son; Rheum arthritis in her mother; Stroke in her father. There is no history of Colon cancer, Colon polyps, Esophageal cancer, Stomach cancer, or Rectal cancer.  Family history: father passed away from MI at age 41. Brother died age 74 from lupus/kidney disease, was on dialysis. Mother: alive age 18, has heart disease, hypertension, arthritis Father:  deceased age 60, heart disease, diabetes, hypertension Mat Gma: heart disease, hypertension Mat Gpa: heart disease, hypertension Pat Gma: cancer Pat Gpa: diabetes, heart disease, hypertension Brother: alive age 26, obese, in SNF  ROS:   Please see the history of present illness.   Additional pertinent ROS otherwise unremarkable.  EKGs/Labs/Other Studies Reviewed:    The following studies were reviewed today:  Echo  10/28/2021:  1. Left ventricular ejection fraction, by estimation, is 60 to 65%. Left  ventricular ejection fraction by 3D volume is 59 %. The left ventricle has  normal function. The left ventricle has no regional wall motion  abnormalities. Left ventricular diastolic   parameters were normal.   2. Right ventricular systolic function is normal. The right ventricular  size is normal. There is normal pulmonary artery systolic pressure. The  estimated right ventricular systolic pressure is 81.0 mmHg.   3. Left  atrial size was mildly dilated.   4. 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 Mild to moderate mitral valve  regurgitation.   5. The aortic valve is tricuspid. There is mild calcification of the  aortic valve. Aortic valve regurgitation is not visualized. Aortic valve  sclerosis is present, with no evidence of aortic valve stenosis.   CTA Chest  10/27/2021: FINDINGS: Cardiovascular: There are no filling defects within the pulmonary arteries to suggest pulmonary embolus. The heart is normal in size. Mild aortic tortuosity, no aneurysm. Minimal aortic atherosclerosis. The left vertebral artery arises directly from the thoracic aorta, variant arch anatomy. There are coronary artery calcifications. No pericardial effusion.   Mediastinum/Nodes: No mediastinal adenopathy or mass. No hilar adenopathy. No esophageal wall thickening. No visualized thyroid nodule.   Lungs/Pleura: Mild hypoventilatory changes in the dependent lungs. Subsegmental atelectasis in the right and left  lower lobe. No confluent consolidation. No pleural effusion. No features of pulmonary edema trachea and central bronchi are patent.   Upper Abdomen: Gastric band in place. No acute upper abdominal findings.   Musculoskeletal: Thoracic spondylosis. There are no acute or suspicious osseous abnormalities. No chest wall soft tissue abnormalities.   Review of the MIP images confirms the above findings.   IMPRESSION: 1. No pulmonary embolus or acute intrathoracic abnormality. 2. Coronary artery calcifications.   Aortic Atherosclerosis (ICD10-I70.0).  Coronary CTA 09/08/2021: FINDINGS: Quality: Fair, attenuation artifact, HR 54, scanned retrospective due to significant heart rate variability   Coronary calcium score: The patient's coronary artery calcium score is 183, which places the patient in the 94th percentile.   Coronary arteries: Normal coronary origins.  Right dominance.   Right Coronary Artery: Dominant.  Normal vessel.   Left Main Coronary Artery: Normal. Bifurcates into the LAD and LCX arteries.   Left Anterior Descending Coronary Artery: Large anterior artery that wraps around the apex. There is mild proximal 24-49% mixed stenosis (CADRADS2). D1 branch has minimal 1-24% mixed ostial stenosis (CADRADS1).   Left Circumflex Artery: AV groove vessel, no significant disease.   Aorta: Normal size, 33 mm at the mid ascending aorta (level of the PA bifurcation) measured double oblique. No calcifications. No dissection.   Aortic Valve: Trileaflet. No calcifications.   Other findings:   Normal pulmonary vein drainage into the left atrium.   Normal left atrial appendage without a thrombus.   Normal size of the pulmonary artery.   Gastric lap-band noted   Aneurysmal membranous septum without obvious VSD.   IMPRESSION: 1. Minimal to mild non-obstructive CAD, CADRADS = 2.   2. Coronary calcium score of 183. This was 94th percentile for age and sex matched  control.   3. Normal coronary origin with right dominance.   4. Aggressive cardiovascular risk factor modification recommended   5. Aneurysmal membranous septum without obvious VSD.   6. Consider non-coronary causes of chest pain.  Monitor 2021: ~7 days of data recorded on Zio monitor. Patient had a min HR of 47 bpm, max HR of 187 bpm, and avg HR of 77 bpm. Predominant underlying rhythm was Sinus Rhythm. No atrial fibrillation, high degree block, or pauses noted. One brief episode of NSVT, 4 beats. 3 SVT events noted, no longer than 4 beats. Isolated atrial ectopy was rare (<1%). Isolated ventricular ectopy was occasional (4.3%). There were 32 triggered events. These were sinus with frequent association to PVCs.  Echo 10/24/19 1. Normal LV function; there appears to be a small aneurysmal segment in  the membranous septum but no obvious flow suggestive  of residual VSD.   2. Left ventricular ejection fraction, by estimation, is 60 to 65%. The  left ventricle has normal function. The left ventricle has no regional  wall motion abnormalities. Left ventricular diastolic parameters were  normal.   3. Right ventricular systolic function is normal. The right ventricular  size is normal. There is normal pulmonary artery systolic pressure.   4. The mitral valve is normal in structure. Mild mitral valve  regurgitation. No evidence of mitral stenosis.   5. The aortic valve is tricuspid. Aortic valve regurgitation is trivial.  No aortic stenosis is present.   6. The inferior vena cava is normal in size with greater than 50%  respiratory variability, suggesting right atrial pressure of 3 mmHg.   CT cardiac 03/01/19 Coronary calcium score: The patient's coronary artery calcium score is 114, which places the patient in the 94th percentile.   Coronary arteries: Normal coronary origins.  Right dominance.   Right Coronary Artery: Normal caliber vessel, gives rise to PDA. Mild ostial noncalcified plaque  with 1-24% stenosis.   Left Main Coronary Artery: Normal caliber vessel. No significant plaque or stenosis.   Left Anterior Descending Coronary Artery: Normal caliber vessel. Proximal LAD has focal calcified plaque with 25-49% luminal stenosis. Gives rise to 3 diagonal branches. Distal LAD wraps apex   Left Circumflex Artery: Normal caliber vessel. No significant plaque or stenosis. Gives rise to 1 OM branch.   Aorta: Normal size, 31 mm at the mid ascending aorta (level of the PA bifurcation) measured double oblique. Trivial calcifications. No dissection.   Aortic Valve: No calcifications. Trileaflet.   Other findings:  Normal pulmonary vein drainage into the left atrium.  Normal left atrial appendage without a thrombus.  Normal size of the pulmonary artery.  Appears to have a small VSD, likely perimembranous.   IMPRESSION: 1. Mild nonobstructive CAD, CADRADS = 2.  2. Coronary calcium score of 114. This was 94th percentile for age and sex matched control.  3. Normal coronary origin with right dominance.  4. Likely perimembranous VSD.  EKG:  EKG is personally reviewed. 11/10/2021:  EKG was not ordered. 08/07/2020: sinus rhythm at 88 bpm with 3 pvcs (nonconsecutive) 10/06/19: NSR at 68 bpm  Recent Labs: 10/27/2021: Hemoglobin 11.9; Platelets 429 10/29/2021: BUN 10; Creatinine, Ser 0.66; Magnesium 2.2; Potassium 4.3; Sodium 141   Recent Lipid Panel    Component Value Date/Time   CHOL 122 08/17/2021 0006   TRIG 105 08/17/2021 0006   HDL 38 (L) 08/17/2021 0006   CHOLHDL 3.2 08/17/2021 0006   VLDL 21 08/17/2021 0006   LDLCALC 63 08/17/2021 0006    Physical Exam:    VS:  BP 130/72   Pulse 75   Ht 5' 6"  (1.676 m)   Wt 190 lb (86.2 kg)   BMI 30.67 kg/m     Wt Readings from Last 3 Encounters:  11/10/21 190 lb (86.2 kg)  11/10/21 190 lb (86.2 kg)  11/03/21 189 lb (85.7 kg)    GEN: Well nourished, well developed in no acute distress HEENT: Normal, moist mucous membranes NECK:  No JVD CARDIAC: regular rhythm, normal S1 and S2, no rubs or gallops. 1/6 systolic murmur. VASCULAR: Radial and DP pulses 2+ bilaterally. No carotid bruits RESPIRATORY:  Clear to auscultation without rales, wheezing or rhonchi  ABDOMEN: Soft, non-tender, non-distended MUSCULOSKELETAL:  Ambulates independently SKIN: Warm and dry, no edema NEUROLOGIC:  Alert and oriented x 3. No focal neuro deficits noted. PSYCHIATRIC:  Normal affect   ASSESSMENT:  1. Hospital discharge follow-up   2. Nonocclusive coronary atherosclerosis of native coronary artery   3. Hyperlipidemia LDL goal <70   4. Other chest pain   5. Essential hypertension   6. Counseling on health promotion and disease prevention     PLAN:    Chest pain/shortness of breath: -reviewed her recent hospital evaluation as well as prior CV workup -only mild nonobstructive CAD, should not be the source of chest pain -high suspicion for noncardiac cause, especially GI -discussed red flag warning signs that need immediate medical attention  Nonobstructive CAD Family history of heart disease: -continue atorvastatin, aspirin  Palpitations, possible VSD -monitor with 4% PVC burden, symptoms associate with PVCs -She would like to avoid medications unless symptoms become more bothersome -echo largely normal, no VSD flow seen  Hypertension: Goal <130/80, near goal today Continue amlodipine, lisinopril-HCTZ Working on lifestyle  Prior obesity, now s/p lap band  -working on diet and exercise  Cardiac risk counseling and prevention recommendations: -recommend heart healthy/Mediterranean diet, with whole grains, fruits, vegetable, fish, lean meats, nuts, and olive oil. Limit salt. -recommend moderate walking, 3-5 times/week for 30-50 minutes each session. Aim for at least 150 minutes.week. Goal should be pace of 3 miles/hours, or walking 1.5 miles in 30 minutes -recommend avoidance of tobacco products. Avoid excess  alcohol.  Plan for follow up: 1 year or sooner as needed.  Medication Adjustments/Labs and Tests Ordered: Current medicines are reviewed at length with the patient today.  Concerns regarding medicines are outlined above.   No orders of the defined types were placed in this encounter.  No orders of the defined types were placed in this encounter.  Patient Instructions  Medication Instructions:  Your Physician recommend you continue on your current medication as directed.    *If you need a refill on your cardiac medications before your next appointment, please call your pharmacy*   Lab Work: None ordered today   Testing/Procedures: None ordered today   Follow-Up: At [facility], you and your health needs are our priority.  As part of our continuing mission to provide you with exceptional heart care, we have created designated Provider Care Teams.  These Care Teams include your primary Cardiologist (physician) and Advanced Practice Providers (APPs -  Physician Assistants and Nurse Practitioners) who all work together to provide you with the care you need, when you need it.  We recommend signing up for the patient portal called "MyChart".  Sign up information is provided on this After Visit Summary.  MyChart is used to connect with patients for Virtual Visits (Telemedicine).  Patients are able to view lab/test results, encounter notes, upcoming appointments, etc.  Non-urgent messages can be sent to your provider as well.   To learn more about what you can do with MyChart, go to [email or web address].    Your next appointment:   1 year(s)  The format for your next appointment:   In Person  Provider:   Buford Dresser, MD{         I,Mathew Stumpf,acting as a scribe for Buford Dresser, MD.,have documented all relevant documentation on the behalf of Buford Dresser, MD,as directed by  Buford Dresser, MD while in the presence of Buford Dresser, MD.  I, Buford Dresser, MD, have reviewed all documentation for this visit. The documentation on 11/10/21 for the exam, diagnosis, procedures, and orders are all accurate and complete.   Signed, Buford Dresser, MD PhD 11/10/2021     [place]

## 2021-11-10 NOTE — Progress Notes (Signed)
[facility] PREP Weekly Session  Patient Details  Name: Samantha Molina MRN: [id number] Date of Birth: Jan 14, 1961 Age: 61 y.o. PCP: Deatra James, MD  Vitals:   11/10/21 1045  Weight: 190 lb (86.2 kg)     [facility] Weekly seesion - 11/10/21 1000       [facility] "PREP" Location   [facility] "PREP" Location [facility]      Weekly Session   Topic Discussed Health habits   Sugar demo; 24 gm for women, 36 gm for men added sugar daily limit   Classes attended to date 6             Honora Searson B Randi Poullard 11/10/2021, 10:46 AM

## 2021-11-11 ENCOUNTER — Other Ambulatory Visit (HOSPITAL_COMMUNITY): Payer: Self-pay

## 2021-11-17 ENCOUNTER — Other Ambulatory Visit (HOSPITAL_COMMUNITY): Payer: Self-pay

## 2021-11-17 MED ORDER — TOPIRAMATE 100 MG PO TABS
100.0000 mg | ORAL_TABLET | Freq: Two times a day (BID) | ORAL | 5 refills | Status: DC
  Filled 2021-11-17: qty 60, 30d supply, fill #0
  Filled 2021-12-22 – 2022-01-06 (×2): qty 60, 30d supply, fill #1
  Filled 2022-02-03: qty 60, 30d supply, fill #2
  Filled 2022-03-08: qty 60, 30d supply, fill #3
  Filled 2022-04-30: qty 60, 30d supply, fill #4
  Filled 2022-06-11: qty 60, 30d supply, fill #5

## 2021-11-26 NOTE — Progress Notes (Signed)
[facility] PREP Weekly Session  Patient Details  Name: Samantha Molina MRN: [id number] Date of Birth: [birth year] Age: 61 y.o. PCP: Deatra James, MD  Vitals:   11/26/21 1102  Weight: 189 lb 6.4 oz (85.9 kg)     [facility] Weekly seesion - 11/26/21 1100       [facility] "PREP" Location   [facility] "PREP" Location [facility]      Weekly Session   Topic Discussed Stress management and problem solving   fingertip mudra meditation; importance of sleep; introduced CALM app and InsightTimer app   Classes attended to date 23             Jahmiya Guidotti B Damien Cisar 11/26/2021, 11:04 AM

## 2021-11-28 ENCOUNTER — Other Ambulatory Visit (HOSPITAL_COMMUNITY): Payer: Self-pay

## 2021-12-01 ENCOUNTER — Encounter: Payer: Self-pay | Admitting: *Deleted

## 2021-12-01 NOTE — Progress Notes (Signed)
[facility] PREP Weekly Session  Patient Details  Name: Samantha Molina MRN: [id number] Date of Birth: 1961/01/10 Age: 61 y.o. PCP: Deatra James, MD  Vitals:   12/01/21 1040  Weight: 184 lb 3.2 oz (83.6 kg)     [facility] Weekly seesion - 12/01/21 1000       [facility] "PREP" Location   [facility] "PREP" Location [facility]      Weekly Session   Topic Discussed Expectations and non-scale victories   Restating goals. Focusing on realistic expectations and managing challenges.   Classes attended to date 10             [name] 12/01/2021, 10:42 AM

## 2021-12-04 ENCOUNTER — Other Ambulatory Visit (HOSPITAL_COMMUNITY): Payer: Self-pay

## 2021-12-04 MED ORDER — OXYCODONE-ACETAMINOPHEN 10-325 MG PO TABS
1.0000 | ORAL_TABLET | Freq: Three times a day (TID) | ORAL | 0 refills | Status: DC | PRN
  Filled 2021-12-06 – 2021-12-08 (×3): qty 90, 30d supply, fill #0

## 2021-12-05 ENCOUNTER — Other Ambulatory Visit (HOSPITAL_COMMUNITY): Payer: Self-pay

## 2021-12-06 ENCOUNTER — Other Ambulatory Visit (HOSPITAL_COMMUNITY): Payer: Self-pay

## 2021-12-08 ENCOUNTER — Other Ambulatory Visit (HOSPITAL_COMMUNITY): Payer: Self-pay

## 2021-12-11 ENCOUNTER — Ambulatory Visit (INDEPENDENT_AMBULATORY_CARE_PROVIDER_SITE_OTHER): Payer: 59 | Admitting: Psychology

## 2021-12-11 DIAGNOSIS — F331 Major depressive disorder, recurrent, moderate: Secondary | ICD-10-CM | POA: Diagnosis not present

## 2021-12-11 NOTE — Progress Notes (Signed)
[facility] Counselor/Therapist Progress Note  Patient ID: Samantha Molina, MRN: [id number],    Date: 12/11/2021  Time Spent: 10:00am-10:50am    50 minutes   Treatment Type: Individual Therapy  Reported Symptoms: stress  Mental Status Exam: Appearance:  Casual     Behavior: Appropriate  Motor: Normal  Speech/Language:  Normal Rate  Affect: Appropriate  Mood: normal  Thought process: normal  Thought content:   WNL  Sensory/Perceptual disturbances:   WNL  Orientation: oriented to person, place, time/date, and situation  Attention: Good  Concentration: Good  Memory: WNL  Fund of knowledge:  Good  Insight:   Good  Judgment:  Good  Impulse Control: Good   Risk Assessment: Danger to Self:  No Self-injurious Behavior: No Danger to Others: No Duty to Warn:no Physical Aggression / Violence:No  Access to Firearms a concern: No  Gang Involvement:No   Subjective: Pt [name] present for face-to-face individual therapy via video Webex.  Pt consents to telehealth video session due to COVID 19 pandemic. Location of pt: home Location of therapist: home office.  Pt talked about stress at home.  Pt's husband's car has been in the shop for over a month.  It has been stressful having only one car.   Pt's cardiologist started her on an exercise program at the [facility].    It has been hard for pt to get there bc of transportation issues.  The program is important for her to go to.   Pt is frustrated with her husband for not following through with getting a rental car.   Helped pt process her feelings and relationship dynamics.   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  Pt will retire April 30th 2024.   Pt talked about her mood.  She feels sad at times when people do not appreciate what she does for them.   She also feels hurt that some people are not going to come to her retirement party.   Worked on self care strategies.    Provided supportive therapy.    Interventions: Cognitive Behavioral Therapy and Insight-Oriented  Diagnosis: F33.1  Plan: See pt's Treatment Plan for depression in Therapy Charts.  (Treatment Plan Target Date: 02/17/2022) Pt is progressing toward treatment goals.   Plan to continue to see pt every two weeks.    Akasia Ahmad, LCSW

## 2021-12-17 ENCOUNTER — Other Ambulatory Visit (HOSPITAL_COMMUNITY): Payer: Self-pay

## 2021-12-22 ENCOUNTER — Other Ambulatory Visit (HOSPITAL_COMMUNITY): Payer: Self-pay

## 2021-12-22 ENCOUNTER — Encounter: Payer: Self-pay | Admitting: *Deleted

## 2021-12-22 MED ORDER — DESVENLAFAXINE SUCCINATE ER 50 MG PO TB24
50.0000 mg | ORAL_TABLET | Freq: Every day | ORAL | 0 refills | Status: DC
  Filled 2021-12-22: qty 90, 90d supply, fill #0

## 2021-12-22 NOTE — Progress Notes (Signed)
[facility] PREP Weekly Session  Patient Details  Name: Samantha Molina MRN: [id number] Date of Birth: 1960/12/23 Age: 61 y.o. PCP: Donald Prose, MD  Vitals:   12/22/21 1014  Weight: 189 lb 6.4 oz (85.9 kg)     [facility] Weekly seesion - 12/22/21 1000       [facility] "PREP" Location   [facility] "PREP" Location [facility]      Weekly Session   Topic Discussed Finding support   Accountability partners, identifying ways to stay on track.   Minutes exercised this week 150 minutes    Classes attended to date [place] 12/22/2021, 10:17 AM

## 2021-12-29 ENCOUNTER — Encounter: Payer: Self-pay | Admitting: *Deleted

## 2021-12-29 NOTE — Progress Notes (Signed)
[facility] PREP Weekly Session  Patient Details  Name: Samantha Molina MRN: [id number] Date of Birth: 1960/06/28 Age: 61 y.o. PCP: Donald Prose, MD  Vitals:   12/29/21 1014  Weight: 194 lb (88 kg)     [facility] Weekly seesion - 12/29/21 1000       [facility] "PREP" Location   [facility] "PREP" Location [facility]      Weekly Session   Topic Discussed Calorie breakdown   Discuss quality macronutrients, eating healthy on a budget, review.   Minutes exercised this week --   reports walking 2 5K's this week, strength training x2.   Classes attended to date [place], [place] 12/29/2021, 10:21 AM

## 2021-12-30 ENCOUNTER — Other Ambulatory Visit (HOSPITAL_COMMUNITY): Payer: Self-pay

## 2022-01-05 ENCOUNTER — Other Ambulatory Visit (HOSPITAL_COMMUNITY): Payer: Self-pay

## 2022-01-05 ENCOUNTER — Encounter: Payer: Self-pay | Admitting: *Deleted

## 2022-01-05 MED ORDER — OXYCODONE-ACETAMINOPHEN 10-325 MG PO TABS
1.0000 | ORAL_TABLET | Freq: Three times a day (TID) | ORAL | 0 refills | Status: DC | PRN
Start: 2022-01-06 — End: 2022-02-04
  Filled 2022-01-06: qty 90, 30d supply, fill #0

## 2022-01-05 NOTE — Progress Notes (Signed)
[facility] PREP Weekly Session  Patient Details  Name: Samantha Molina MRN: [id number] Date of Birth: May 20, 1960 Age: 61 y.o. PCP: Donald Prose, MD  Vitals:   01/05/22 1002  Weight: 194 lb (88 kg)     [facility] Weekly seesion - 01/05/22 1000       [facility] "PREP" Location   [facility] "PREP" Location [facility]      Weekly Session   Topic Discussed Hitting roadblocks   Review and questions. [employer] membership talk by Yetta Flock, Final Fitness assessment, How [employer] and [employer].   Classes attended to date [place], [place] 01/05/2022, 10:03 AM

## 2022-01-06 ENCOUNTER — Other Ambulatory Visit (HOSPITAL_COMMUNITY): Payer: Self-pay

## 2022-01-07 ENCOUNTER — Encounter: Payer: Self-pay | Admitting: *Deleted

## 2022-01-07 ENCOUNTER — Other Ambulatory Visit (HOSPITAL_COMMUNITY): Payer: Self-pay

## 2022-01-07 NOTE — Progress Notes (Signed)
[facility] PREP Evaluation  Patient Details  Name: Samantha Molina MRN: [id number] Date of Birth: 06/11/1960 Age: 61 y.o. PCP: Donald Prose, MD  Vitals:   01/07/22 1127  BP: 124/62  Pulse: (!) 55  Resp: 20  SpO2: 96%  Weight: 199 lb 9.6 oz (90.5 kg)     [facility] Eval - 01/07/22 1100       [facility] "PREP" Location   [facility] "PREP" Location [facility]      Referral    Referring Provider C. Walker    Reason for referral Hypertension;Obesitity/Overweight    Program Start Date 10/20/21      Measurement   Waist Circumference 40 inches    Hip Circumference 42 inches    Body fat 39.1 percent      Mobility and Daily Activities   I find it easy to walk up or down two or more flights of stairs. 3    I have no trouble taking out the trash. 4    I do housework such as vacuuming and dusting on my own without difficulty. 4    I can easily lift a gallon of milk (8lbs). 4    I can easily walk a mile. 4    I have no trouble reaching into high cupboards or reaching down to pick up something from the floor. 4    I do not have trouble doing out-door work such as Armed forces logistics/support/administrative officer, raking leaves, or gardening. 4      Mobility and Daily Activities   I feel younger than my age. 4    I feel independent. 4    I feel energetic. 4    I live an active life.  4    I feel strong. 4    I feel healthy. 3    I feel active as other people my age. 4      How fit and strong are you.   Fit and Strong Total Score 54            Past Medical History:  Diagnosis Date   Arthritis    Arthritis    both knees   Chronic leg pain    Coronary artery disease    Depression    Diabetes mellitus without complication ([place])    Heart murmur    as a child   Hypertension    Migraine without aura    Morbid obesity ([place])    Urinary incontinence    Vitamin D deficiency disease 2013   Past Surgical History:  Procedure Laterality Date   JOINT REPLACEMENT Left 2022   Knee   KNEE ARTHROSCOPY W/ ACL  RECONSTRUCTION Left 2004   LAPAROSCOPIC CHOLECYSTECTOMY  2005   LAPAROSCOPIC GASTRIC BANDING  2007   TONSILLECTOMY  1984   Age 75   TOTAL VAGINAL HYSTERECTOMY  age 46   Ovaries remain, for menorrhagia   TUBAL LIGATION     Social History   Tobacco Use  Smoking Status Never  Smokeless Tobacco Never    Norris Cross 01/07/2022, 11:35 AM  Completed PREP. Has attended 9 educational sessions and 9 exercise sessions. She has increased her Fit and Strong survey by 11 points. She reports a significant increase in her activity level but has struggled with snacking after work and "stress eating." She has reset goals and detailed a plan for continued efforts for health improvement.

## 2022-01-08 DIAGNOSIS — M5459 Other low back pain: Secondary | ICD-10-CM | POA: Diagnosis not present

## 2022-01-08 DIAGNOSIS — M542 Cervicalgia: Secondary | ICD-10-CM | POA: Diagnosis not present

## 2022-01-08 DIAGNOSIS — F419 Anxiety disorder, unspecified: Secondary | ICD-10-CM | POA: Diagnosis not present

## 2022-01-08 DIAGNOSIS — Z79899 Other long term (current) drug therapy: Secondary | ICD-10-CM | POA: Diagnosis not present

## 2022-01-08 DIAGNOSIS — G43009 Migraine without aura, not intractable, without status migrainosus: Secondary | ICD-10-CM | POA: Diagnosis not present

## 2022-01-09 ENCOUNTER — Ambulatory Visit (INDEPENDENT_AMBULATORY_CARE_PROVIDER_SITE_OTHER): Payer: 59 | Admitting: Psychology

## 2022-01-09 DIAGNOSIS — F331 Major depressive disorder, recurrent, moderate: Secondary | ICD-10-CM

## 2022-01-09 NOTE — Progress Notes (Signed)
[facility] Counselor/Therapist Progress Note  Patient ID: Samantha Molina, MRN: [id number],    Date: 01/09/2022  Time Spent: 10:00am-10:50am    50 minutes   Treatment Type: Individual Therapy  Reported Symptoms: stress  Mental Status Exam: Appearance:  Casual     Behavior: Appropriate  Motor: Normal  Speech/Language:  Normal Rate  Affect: Appropriate  Mood: normal  Thought process: normal  Thought content:   WNL  Sensory/Perceptual disturbances:   WNL  Orientation: oriented to person, place, time/date, and situation  Attention: Good  Concentration: Good  Memory: WNL  Fund of knowledge:  Good  Insight:   Good  Judgment:  Good  Impulse Control: Good   Risk Assessment: Danger to Self:  No Self-injurious Behavior: No Danger to Others: No Duty to Warn:no Physical Aggression / Violence:No  Access to Firearms a concern: No  Gang Involvement:No   Subjective: Pt Samantha Molina present for face-to-face individual therapy via video Webex.  Pt consents to telehealth video session due to COVID 19 pandemic. Location of pt: home Location of therapist: home office.  Pt talked about concerns about her son's health.   He had a heart attack last week.   He was in the hospital and pt was very worries about him.   Pt has felt down bc of worrying about her son.  Pt has had to help her son financially and has supplied food for them.  Pt was tearful as she talked about how worried she is about her son.   Helped pt process her feelings.  Pt talked about work.   She feels very ready to retire bc everything is getting on her nerves.  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   Worked on self care strategies.  [facility].   She has been getting massages.   Pt is going to volunteer at her granddaughter's school.  She is very excited about this.   Provided supportive therapy.    Interventions: Cognitive Behavioral Therapy and  Insight-Oriented  Diagnosis: F33.1  Plan: See pt's Treatment Plan for depression in Therapy Charts.  (Treatment Plan Target Date: 02/17/2022) Pt is progressing toward treatment goals.   Plan to continue to see pt every two weeks.    Samantha Beth, LCSW

## 2022-01-29 ENCOUNTER — Other Ambulatory Visit (HOSPITAL_COMMUNITY): Payer: Self-pay

## 2022-01-29 DIAGNOSIS — N390 Urinary tract infection, site not specified: Secondary | ICD-10-CM | POA: Diagnosis not present

## 2022-01-29 DIAGNOSIS — R350 Frequency of micturition: Secondary | ICD-10-CM | POA: Diagnosis not present

## 2022-01-29 MED ORDER — CEPHALEXIN 250 MG PO CAPS
250.0000 mg | ORAL_CAPSULE | Freq: Every day | ORAL | 3 refills | Status: AC
  Filled 2022-01-29 – 2022-03-08 (×2): qty 90, 90d supply, fill #0
  Filled 2022-06-11: qty 90, 90d supply, fill #1

## 2022-02-03 ENCOUNTER — Other Ambulatory Visit (HOSPITAL_COMMUNITY): Payer: Self-pay

## 2022-02-04 ENCOUNTER — Other Ambulatory Visit (HOSPITAL_COMMUNITY): Payer: Self-pay

## 2022-02-04 MED ORDER — OXYCODONE-ACETAMINOPHEN 10-325 MG PO TABS
1.0000 | ORAL_TABLET | Freq: Three times a day (TID) | ORAL | 0 refills | Status: DC | PRN
  Filled 2022-02-04 – ????-??-?? (×3): qty 90, 30d supply, fill #0

## 2022-02-05 ENCOUNTER — Other Ambulatory Visit (HOSPITAL_COMMUNITY): Payer: Self-pay

## 2022-02-05 DIAGNOSIS — Z0000 Encounter for general adult medical examination without abnormal findings: Secondary | ICD-10-CM | POA: Diagnosis not present

## 2022-02-05 DIAGNOSIS — Z13220 Encounter for screening for lipoid disorders: Secondary | ICD-10-CM | POA: Diagnosis not present

## 2022-02-05 DIAGNOSIS — E669 Obesity, unspecified: Secondary | ICD-10-CM | POA: Diagnosis not present

## 2022-02-05 DIAGNOSIS — Z6834 Body mass index (BMI) 34.0-34.9, adult: Secondary | ICD-10-CM | POA: Diagnosis not present

## 2022-02-05 DIAGNOSIS — Z1329 Encounter for screening for other suspected endocrine disorder: Secondary | ICD-10-CM | POA: Diagnosis not present

## 2022-02-05 DIAGNOSIS — I10 Essential (primary) hypertension: Secondary | ICD-10-CM | POA: Diagnosis not present

## 2022-02-05 DIAGNOSIS — Z131 Encounter for screening for diabetes mellitus: Secondary | ICD-10-CM | POA: Diagnosis not present

## 2022-02-05 DIAGNOSIS — Z713 Dietary counseling and surveillance: Secondary | ICD-10-CM | POA: Diagnosis not present

## 2022-02-05 MED ORDER — WEGOVY 0.5 MG/0.5ML ~~LOC~~ SOAJ
0.5000 mg | SUBCUTANEOUS | 0 refills | Status: DC
  Filled 2022-02-05 – 2022-04-06 (×2): qty 2, 28d supply, fill #0

## 2022-02-05 MED ORDER — ATORVASTATIN CALCIUM 10 MG PO TABS
10.0000 mg | ORAL_TABLET | Freq: Every day | ORAL | 0 refills | Status: DC
  Filled 2022-02-05: qty 90, 90d supply, fill #0

## 2022-02-06 ENCOUNTER — Ambulatory Visit (INDEPENDENT_AMBULATORY_CARE_PROVIDER_SITE_OTHER): Payer: 59 | Admitting: Psychology

## 2022-02-06 ENCOUNTER — Other Ambulatory Visit (HOSPITAL_COMMUNITY): Payer: Self-pay

## 2022-02-06 DIAGNOSIS — F331 Major depressive disorder, recurrent, moderate: Secondary | ICD-10-CM | POA: Diagnosis not present

## 2022-02-06 NOTE — Progress Notes (Signed)
[facility] Counselor/Therapist Progress Note  Patient ID: Samantha Molina, MRN: [id number],    Date: 02/06/2022  Time Spent: 10:00am-10:50am    50 minutes   Treatment Type: Individual Therapy  Reported Symptoms: stress  Mental Status Exam: Appearance:  Casual     Behavior: Appropriate  Motor: Normal  Speech/Language:  Normal Rate  Affect: Appropriate  Mood: normal  Thought process: normal  Thought content:   WNL  Sensory/Perceptual disturbances:   WNL  Orientation: oriented to person, place, time/date, and situation  Attention: Good  Concentration: Good  Memory: WNL  Fund of knowledge:  Good  Insight:   Good  Judgment:  Good  Impulse Control: Good   Risk Assessment: Danger to Self:  No Self-injurious Behavior: No Danger to Others: No Duty to Warn:no Physical Aggression / Violence:No  Access to Firearms a concern: No  Gang Involvement:No   Subjective: Pt Samantha Molina present for face-to-face individual therapy via video Webex.  Pt consents to telehealth video session due to COVID 19 pandemic. Location of pt: home Location of therapist: home office.  Pt talked about November 15 being the 13th anniversary of her brother's suicide.  Pt's mother does not like to talk about it but pt feels a need to talk about it.  Some of the family knows the death was a suicide and some do not.   Addressed the complex family dynamics.  Helped pt process her feelings and grief.   Pt talked about her relationship with her friend.   They use to spend a lot of time together but now do not.  Pt is not sure what caused the disconnect. Addressed pt's feelings. Pt has had a couple of friends die of cancer.   She states she tries to keep her friend circle small now.   Pt talked about work.   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   Worked on self care strategies.   Provided supportive therapy.    Interventions: Cognitive Behavioral Therapy and  Insight-Oriented  Diagnosis: F33.1  Plan: See pt's Treatment Plan for depression in Therapy Charts.  (Treatment Plan Target Date: 02/17/2022) Pt is progressing toward treatment goals.   Plan to continue to see pt every two weeks.    Theodoro Koval, LCSW

## 2022-03-06 ENCOUNTER — Ambulatory Visit: Admitting: Psychology

## 2022-03-06 ENCOUNTER — Other Ambulatory Visit (HOSPITAL_COMMUNITY): Payer: Self-pay

## 2022-03-08 ENCOUNTER — Other Ambulatory Visit (HOSPITAL_COMMUNITY): Payer: Self-pay

## 2022-03-09 ENCOUNTER — Other Ambulatory Visit (HOSPITAL_COMMUNITY): Payer: Self-pay

## 2022-03-09 ENCOUNTER — Other Ambulatory Visit: Payer: Self-pay

## 2022-03-10 ENCOUNTER — Other Ambulatory Visit (HOSPITAL_COMMUNITY): Payer: Self-pay

## 2022-03-10 MED ORDER — OXYCODONE-ACETAMINOPHEN 10-325 MG PO TABS
1.0000 | ORAL_TABLET | Freq: Three times a day (TID) | ORAL | 0 refills | Status: DC | PRN
  Filled 2022-03-10: qty 90, 30d supply, fill #0

## 2022-03-17 ENCOUNTER — Other Ambulatory Visit (HOSPITAL_COMMUNITY): Payer: Self-pay

## 2022-03-17 MED ORDER — LISINOPRIL-HYDROCHLOROTHIAZIDE 20-25 MG PO TABS
1.0000 | ORAL_TABLET | Freq: Every day | ORAL | 0 refills | Status: DC
  Filled 2022-03-17: qty 90, 90d supply, fill #0

## 2022-03-17 MED ORDER — DESVENLAFAXINE SUCCINATE ER 50 MG PO TB24
50.0000 mg | ORAL_TABLET | Freq: Every day | ORAL | 0 refills | Status: DC
  Filled 2022-03-17: qty 90, 90d supply, fill #0

## 2022-04-07 ENCOUNTER — Other Ambulatory Visit (HOSPITAL_COMMUNITY): Payer: Self-pay

## 2022-04-08 ENCOUNTER — Other Ambulatory Visit (HOSPITAL_COMMUNITY): Payer: Self-pay

## 2022-04-08 ENCOUNTER — Other Ambulatory Visit: Payer: Self-pay

## 2022-04-08 MED ORDER — OXYCODONE-ACETAMINOPHEN 10-325 MG PO TABS
1.0000 | ORAL_TABLET | Freq: Three times a day (TID) | ORAL | 0 refills | Status: DC | PRN
Start: 2022-04-08 — End: 2022-05-06
  Filled 2022-04-08: qty 90, 30d supply, fill #0

## 2022-04-09 DIAGNOSIS — Z6834 Body mass index (BMI) 34.0-34.9, adult: Secondary | ICD-10-CM | POA: Diagnosis not present

## 2022-04-09 DIAGNOSIS — Z713 Dietary counseling and surveillance: Secondary | ICD-10-CM | POA: Diagnosis not present

## 2022-04-09 DIAGNOSIS — E669 Obesity, unspecified: Secondary | ICD-10-CM | POA: Diagnosis not present

## 2022-04-09 DIAGNOSIS — Z3A34 34 weeks gestation of pregnancy: Secondary | ICD-10-CM | POA: Diagnosis not present

## 2022-04-13 ENCOUNTER — Other Ambulatory Visit (HOSPITAL_COMMUNITY): Payer: Self-pay

## 2022-04-13 MED ORDER — ONDANSETRON 4 MG PO TBDP
8.0000 mg | ORAL_TABLET | Freq: Two times a day (BID) | ORAL | 0 refills | Status: AC
  Filled 2022-04-13: qty 60, 15d supply, fill #0

## 2022-04-16 ENCOUNTER — Encounter (HOSPITAL_COMMUNITY): Payer: Self-pay | Admitting: Family Medicine

## 2022-04-16 ENCOUNTER — Other Ambulatory Visit: Payer: Self-pay

## 2022-04-16 ENCOUNTER — Emergency Department (HOSPITAL_COMMUNITY): Payer: Commercial Managed Care - PPO

## 2022-04-16 ENCOUNTER — Inpatient Hospital Stay (HOSPITAL_COMMUNITY)
Admission: EM | Admit: 2022-04-16 | Discharge: 2022-04-18 | DRG: 394 | Disposition: A | Discharge status: Home / Self Care | Payer: Commercial Managed Care - PPO | Attending: Internal Medicine | Admitting: Internal Medicine

## 2022-04-16 DIAGNOSIS — I2510 Atherosclerotic heart disease of native coronary artery without angina pectoris: Secondary | ICD-10-CM | POA: Diagnosis present

## 2022-04-16 DIAGNOSIS — G8929 Other chronic pain: Secondary | ICD-10-CM | POA: Diagnosis not present

## 2022-04-16 DIAGNOSIS — Z8261 Family history of arthritis: Secondary | ICD-10-CM

## 2022-04-16 DIAGNOSIS — R112 Nausea with vomiting, unspecified: Secondary | ICD-10-CM | POA: Diagnosis not present

## 2022-04-16 DIAGNOSIS — E119 Type 2 diabetes mellitus without complications: Secondary | ICD-10-CM | POA: Diagnosis present

## 2022-04-16 DIAGNOSIS — Z803 Family history of malignant neoplasm of breast: Secondary | ICD-10-CM

## 2022-04-16 DIAGNOSIS — E876 Hypokalemia: Secondary | ICD-10-CM | POA: Diagnosis present

## 2022-04-16 DIAGNOSIS — F419 Anxiety disorder, unspecified: Secondary | ICD-10-CM | POA: Diagnosis present

## 2022-04-16 DIAGNOSIS — Z7982 Long term (current) use of aspirin: Secondary | ICD-10-CM

## 2022-04-16 DIAGNOSIS — Z832 Family history of diseases of the blood and blood-forming organs and certain disorders involving the immune mechanism: Secondary | ICD-10-CM

## 2022-04-16 DIAGNOSIS — G43009 Migraine without aura, not intractable, without status migrainosus: Secondary | ICD-10-CM | POA: Diagnosis not present

## 2022-04-16 DIAGNOSIS — Z833 Family history of diabetes mellitus: Secondary | ICD-10-CM

## 2022-04-16 DIAGNOSIS — E861 Hypovolemia: Secondary | ICD-10-CM | POA: Diagnosis not present

## 2022-04-16 DIAGNOSIS — F320 Major depressive disorder, single episode, mild: Secondary | ICD-10-CM | POA: Diagnosis not present

## 2022-04-16 DIAGNOSIS — G894 Chronic pain syndrome: Secondary | ICD-10-CM | POA: Diagnosis present

## 2022-04-16 DIAGNOSIS — Z823 Family history of stroke: Secondary | ICD-10-CM

## 2022-04-16 DIAGNOSIS — Z888 Allergy status to other drugs, medicaments and biological substances status: Secondary | ICD-10-CM

## 2022-04-16 DIAGNOSIS — E860 Dehydration: Secondary | ICD-10-CM | POA: Diagnosis present

## 2022-04-16 DIAGNOSIS — I9589 Other hypotension: Secondary | ICD-10-CM

## 2022-04-16 DIAGNOSIS — Z8249 Family history of ischemic heart disease and other diseases of the circulatory system: Secondary | ICD-10-CM

## 2022-04-16 DIAGNOSIS — N179 Acute kidney failure, unspecified: Secondary | ICD-10-CM | POA: Diagnosis present

## 2022-04-16 DIAGNOSIS — R1115 Cyclical vomiting syndrome unrelated to migraine: Secondary | ICD-10-CM | POA: Diagnosis not present

## 2022-04-16 DIAGNOSIS — R55 Syncope and collapse: Secondary | ICD-10-CM | POA: Diagnosis present

## 2022-04-16 DIAGNOSIS — Z807 Family history of other malignant neoplasms of lymphoid, hematopoietic and related tissues: Secondary | ICD-10-CM

## 2022-04-16 DIAGNOSIS — R109 Unspecified abdominal pain: Secondary | ICD-10-CM | POA: Diagnosis not present

## 2022-04-16 DIAGNOSIS — R197 Diarrhea, unspecified: Secondary | ICD-10-CM | POA: Diagnosis present

## 2022-04-16 DIAGNOSIS — I10 Essential (primary) hypertension: Secondary | ICD-10-CM | POA: Diagnosis present

## 2022-04-16 DIAGNOSIS — Z9884 Bariatric surgery status: Secondary | ICD-10-CM

## 2022-04-16 DIAGNOSIS — Z83438 Family history of other disorder of lipoprotein metabolism and other lipidemia: Secondary | ICD-10-CM

## 2022-04-16 DIAGNOSIS — Z79899 Other long term (current) drug therapy: Secondary | ICD-10-CM

## 2022-04-16 DIAGNOSIS — T50995A Adverse effect of other drugs, medicaments and biological substances, initial encounter: Secondary | ICD-10-CM | POA: Diagnosis present

## 2022-04-16 LAB — URINALYSIS, ROUTINE W REFLEX MICROSCOPIC
Bilirubin Urine: NEGATIVE
Glucose, UA: NEGATIVE mg/dL
Ketones, ur: NEGATIVE mg/dL
Nitrite: NEGATIVE
Protein, ur: 30 mg/dL — AB
RBC / HPF: >50 RBC/hpf (ref 0–5)
Specific Gravity, Urine: 1.03 (ref 1.005–1.030)
pH: 5 (ref 5.0–8.0)

## 2022-04-16 LAB — I-STAT CHEM 8, ED
BUN: 21 mg/dL (ref 8–23)
Calcium, Ion: 1.17 mmol/L (ref 1.15–1.40)
Chloride: 104 mmol/L (ref 98–111)
Creatinine, Ser: 1.8 mg/dL — ABNORMAL HIGH (ref 0.44–1.00)
Glucose, Bld: 153 mg/dL — ABNORMAL HIGH (ref 70–99)
HCT: 46 % (ref 36.0–46.0)
Hemoglobin: 15.6 g/dL — ABNORMAL HIGH (ref 12.0–15.0)
Potassium: 3.8 mmol/L (ref 3.5–5.1)
Sodium: 139 mmol/L (ref 135–145)
TCO2: 24 mmol/L (ref 22–32)

## 2022-04-16 LAB — COMPREHENSIVE METABOLIC PANEL
ALT: 19 U/L (ref 0–44)
AST: 34 U/L (ref 15–41)
Albumin: 4.2 g/dL (ref 3.5–5.0)
Alkaline Phosphatase: 49 U/L (ref 38–126)
Anion gap: 15 (ref 5–15)
BUN: 18 mg/dL (ref 8–23)
CO2: 21 mmol/L — ABNORMAL LOW (ref 22–32)
Calcium: 10.8 mg/dL — ABNORMAL HIGH (ref 8.9–10.3)
Chloride: 100 mmol/L (ref 98–111)
Creatinine, Ser: 1.74 mg/dL — ABNORMAL HIGH (ref 0.44–1.00)
GFR, Estimated: 33 mL/min — ABNORMAL LOW (ref >60)
Glucose, Bld: 158 mg/dL — ABNORMAL HIGH (ref 70–99)
Potassium: 3.7 mmol/L (ref 3.5–5.1)
Sodium: 136 mmol/L (ref 135–145)
Total Bilirubin: 0.8 mg/dL (ref 0.3–1.2)
Total Protein: 7.7 g/dL (ref 6.5–8.1)

## 2022-04-16 LAB — CBC WITH DIFFERENTIAL/PLATELET
Abs Immature Granulocytes: 0.04 10*3/uL (ref 0.00–0.07)
Basophils Absolute: 0 10*3/uL (ref 0.0–0.1)
Basophils Relative: 1 %
Eosinophils Absolute: 0.1 10*3/uL (ref 0.0–0.5)
Eosinophils Relative: 1 %
HCT: 45 % (ref 36.0–46.0)
Hemoglobin: 14.3 g/dL (ref 12.0–15.0)
Immature Granulocytes: 1 %
Lymphocytes Relative: 54 %
Lymphs Abs: 2.9 10*3/uL (ref 0.7–4.0)
MCH: 23.6 pg — ABNORMAL LOW (ref 26.0–34.0)
MCHC: 31.8 g/dL (ref 30.0–36.0)
MCV: 74.1 fL — ABNORMAL LOW (ref 80.0–100.0)
Monocytes Absolute: 0.5 10*3/uL (ref 0.1–1.0)
Monocytes Relative: 9 %
Neutro Abs: 1.8 10*3/uL (ref 1.7–7.7)
Neutrophils Relative %: 34 %
Platelets: 396 10*3/uL (ref 150–400)
RBC: 6.07 MIL/uL — ABNORMAL HIGH (ref 3.87–5.11)
RDW: 14.8 % (ref 11.5–15.5)
WBC: 5.3 10*3/uL (ref 4.0–10.5)
nRBC: 0 % (ref 0.0–0.2)

## 2022-04-16 LAB — CBG MONITORING, ED: Glucose-Capillary: 168 mg/dL — ABNORMAL HIGH (ref 70–99)

## 2022-04-16 LAB — TROPONIN I (HIGH SENSITIVITY)
Troponin I (High Sensitivity): 7 ng/L (ref <18)
Troponin I (High Sensitivity): 7 ng/L (ref <18)

## 2022-04-16 LAB — PHOSPHORUS: Phosphorus: 2.7 mg/dL (ref 2.5–4.6)

## 2022-04-16 LAB — MAGNESIUM: Magnesium: 2.3 mg/dL (ref 1.7–2.4)

## 2022-04-16 MED ORDER — ONDANSETRON HCL 4 MG/2ML IJ SOLN
4.0000 mg | Freq: Once | INTRAMUSCULAR | Status: AC
  Administered 2022-04-16: 4 mg via INTRAVENOUS
  Filled 2022-04-16: qty 2

## 2022-04-16 MED ORDER — ONDANSETRON HCL 4 MG/2ML IJ SOLN
4.0000 mg | Freq: Four times a day (QID) | INTRAMUSCULAR | Status: DC | PRN
  Administered 2022-04-17 (×2): 4 mg via INTRAVENOUS
  Filled 2022-04-16 (×2): qty 2

## 2022-04-16 MED ORDER — ACETAMINOPHEN 650 MG RE SUPP: 650.0000 mg | Freq: Four times a day (QID) | RECTAL | Status: DC | PRN

## 2022-04-16 MED ORDER — IOHEXOL 350 MG/ML SOLN
75.0000 mL | Freq: Once | INTRAVENOUS | Status: AC | PRN
  Administered 2022-04-16: 75 mL via INTRAVENOUS

## 2022-04-16 MED ORDER — ASPIRIN 81 MG PO CHEW
81.0000 mg | CHEWABLE_TABLET | Freq: Every day | ORAL | Status: DC
  Administered 2022-04-17: 81 mg via ORAL
  Filled 2022-04-16: qty 1

## 2022-04-16 MED ORDER — SODIUM CHLORIDE 0.9% FLUSH
3.0000 mL | Freq: Two times a day (BID) | INTRAVENOUS | Status: DC
  Administered 2022-04-17 (×2): 3 mL via INTRAVENOUS

## 2022-04-16 MED ORDER — ATORVASTATIN CALCIUM 10 MG PO TABS
10.0000 mg | ORAL_TABLET | Freq: Every day | ORAL | Status: DC
  Administered 2022-04-17: 10 mg via ORAL
  Filled 2022-04-16: qty 1

## 2022-04-16 MED ORDER — ACETAMINOPHEN 325 MG PO TABS
650.0000 mg | ORAL_TABLET | Freq: Four times a day (QID) | ORAL | Status: DC | PRN
  Administered 2022-04-17: 650 mg via ORAL
  Filled 2022-04-16: qty 2

## 2022-04-16 MED ORDER — ALPRAZOLAM 0.25 MG PO TABS: 0.1250 mg | ORAL_TABLET | Freq: Every day | ORAL | Status: DC | PRN

## 2022-04-16 MED ORDER — OXYCODONE-ACETAMINOPHEN 10-325 MG PO TABS: 1.0000 | ORAL_TABLET | Freq: Three times a day (TID) | ORAL | Status: DC | PRN

## 2022-04-16 MED ORDER — LACTATED RINGERS IV BOLUS
1000.0000 mL | Freq: Once | INTRAVENOUS | Status: AC
  Administered 2022-04-16: 1000 mL via INTRAVENOUS

## 2022-04-16 MED ORDER — OXYCODONE HCL 5 MG PO TABS
5.0000 mg | ORAL_TABLET | Freq: Three times a day (TID) | ORAL | Status: DC | PRN
  Administered 2022-04-18: 5 mg via ORAL
  Filled 2022-04-16: qty 1

## 2022-04-16 MED ORDER — ONDANSETRON HCL 4 MG PO TABS
4.0000 mg | ORAL_TABLET | Freq: Four times a day (QID) | ORAL | Status: DC | PRN
  Filled 2022-04-16: qty 1

## 2022-04-16 MED ORDER — HEPARIN SODIUM (PORCINE) 5000 UNIT/ML IJ SOLN
5000.0000 [IU] | Freq: Three times a day (TID) | INTRAMUSCULAR | Status: DC
  Administered 2022-04-17 – 2022-04-18 (×3): 5000 [IU] via SUBCUTANEOUS
  Filled 2022-04-16 (×4): qty 1

## 2022-04-16 MED ORDER — VENLAFAXINE HCL ER 75 MG PO CP24
75.0000 mg | ORAL_CAPSULE | Freq: Every day | ORAL | Status: DC
  Filled 2022-04-16 (×2): qty 1

## 2022-04-16 MED ORDER — TOPIRAMATE 100 MG PO TABS
100.0000 mg | ORAL_TABLET | Freq: Two times a day (BID) | ORAL | Status: DC
  Administered 2022-04-17 (×2): 100 mg via ORAL
  Filled 2022-04-16: qty 1
  Filled 2022-04-16: qty 4
  Filled 2022-04-16: qty 1

## 2022-04-16 MED ORDER — LACTATED RINGERS IV BOLUS
500.0000 mL | Freq: Once | INTRAVENOUS | Status: AC
  Administered 2022-04-16: 500 mL via INTRAVENOUS

## 2022-04-16 MED ORDER — LACTATED RINGERS IV SOLN: INTRAVENOUS | Status: DC

## 2022-04-16 MED ORDER — OXYCODONE-ACETAMINOPHEN 5-325 MG PO TABS
1.0000 | ORAL_TABLET | Freq: Three times a day (TID) | ORAL | Status: DC | PRN
  Administered 2022-04-17 – 2022-04-18 (×3): 1 via ORAL
  Filled 2022-04-16 (×3): qty 1

## 2022-04-16 NOTE — ED Notes (Signed)
Patient transported to CT 

## 2022-04-16 NOTE — H&P (Signed)
History and Physical    Samantha Molina HEN:[id number] DOB: Nov 24, 1960 DOA: 04/16/2022  PCP: Deatra James, MD   Patient coming from: Home   Chief Complaint: LOC, N/V/D   HPI: Samantha Molina is a 62 y.o. female with medical history significant for hypertension, mild nonobstructive CAD, chronic pain, migraines, and obesity status post laparoscopic gastric banding who presents to the emergency department following a loss of consciousness.  Patient reports that she recently started [facility] and has had significant nausea and mild generalized abdominal discomfort since 04/10/2022.  She was sitting on the toilet this evening at work when she developed diaphoresis and then woke up on the floor.  She has had recurrent nausea and vomiting since then, as well as diarrhea.  She denies any chest pain or palpitations.  She has not had any fevers associated with this.   She was lethargic following the syncopal episode, and while blood pressure was normal initially, she became hypotensive in the ED in the setting of acute nausea and vomiting.  ED Course: Upon arrival to the ED, patient is found to be afebrile and saturating well on room air with normal heart rate and normal blood pressure initially.  EKG demonstrates sinus tachycardia with PAC and LVH.  No acute findings noted on CT of the abdomen and pelvis.  Troponin was normal x 2.  Chemistry panel notable for creatinine 1.74.  Patient was given 2.5 L of LR and 2 doses of Zofran in the ED.  Review of Systems:  All other systems reviewed and apart from HPI, are negative.  Past Medical History:  Diagnosis Date   Arthritis    Arthritis    both knees   Chronic leg pain    Coronary artery disease    Depression    Diabetes mellitus without complication ([facility])    Heart murmur    as a child   Hypertension    Migraine without aura    Morbid obesity ([facility])    Urinary incontinence    Vitamin D deficiency disease 2013    Past Surgical History:   Procedure Laterality Date   JOINT REPLACEMENT Left 2022   Knee   KNEE ARTHROSCOPY W/ ACL RECONSTRUCTION Left 2004   LAPAROSCOPIC CHOLECYSTECTOMY  2005   LAPAROSCOPIC GASTRIC BANDING  2007   TONSILLECTOMY  1984   Age 89   TOTAL VAGINAL HYSTERECTOMY  age 67   Ovaries remain, for menorrhagia   TUBAL LIGATION      Social History:   reports that she has never smoked. She has never used smokeless tobacco. She reports that she does not currently use alcohol. She reports that she does not use drugs.  Allergies  Allergen Reactions   Isosorbide Mononitrate [Isosorbide Nitrate] Other (See Comments)    Headache    Family History  Problem Relation Age of Onset   Hypertension Mother    Rheum arthritis Mother        Lupus   Heart disease Mother    Lupus Mother    Diabetes Father    Hypertension Father    Stroke Father    Hyperlipidemia Father    Heart disease Father    Lupus Brother    Diabetes Brother    Hyperlipidemia Brother    Breast cancer Maternal Aunt 61       X 2, one in her 65's   Lupus Maternal Aunt    Breast cancer Paternal Aunt 30       died in her 70's  Lymphoma Son    Breast cancer Cousin 65       maternal & paternal cousin   Colon cancer Neg Hx    Colon polyps Neg Hx    Esophageal cancer Neg Hx    Stomach cancer Neg Hx    Rectal cancer Neg Hx      Prior to Admission medications   Medication Sig Start Date End Date Taking? Authorizing Provider  acetaminophen (TYLENOL) 650 MG CR tablet Take 650 mg by mouth every 8 (eight) hours as needed for pain.    [provider]  ALPRAZolam Duanne Moron) 0.25 MG tablet Take 0.5-1 tablets (0.125-0.25 mg total) by mouth daily as needed. Patient taking differently: Take 0.125-0.25 mg by mouth daily as needed for anxiety. 10/15/20     amLODipine (NORVASC) 5 MG tablet Take 1 tablet (5 mg total) by mouth daily. 10/29/21   Georgette Shell, MD  amLODipine (NORVASC) 5 MG tablet Take 1 tablet (5 mg total) by mouth once a  day. 11/07/21 05/06/22    aspirin 81 MG chewable tablet Chew 1 tablet (81 mg total) by mouth daily. 11/04/21   Loel Dubonnet, NP  atorvastatin (LIPITOR) 10 MG tablet Take 1 tablet (10 mg total) by mouth daily. 02/05/22     cephALEXin (KEFLEX) 250 MG capsule Take 1 capsule (250 mg total) by mouth daily. 01/29/22     desvenlafaxine (PRISTIQ) 50 MG 24 hr tablet Take 1 tablet (50 mg total) by mouth daily. 03/17/22     lisinopril-hydrochlorothiazide (ZESTORETIC) 20-25 MG tablet Take 1 tablet by mouth daily. 03/17/22     Multiple Vitamins-Minerals (MULTIVITAMIN WITH MINERALS) tablet Take 1 tablet by mouth daily.    [provider]  ondansetron (ZOFRAN-ODT) 4 MG disintegrating tablet Dissolve 2 tablets (8 mg total) by mouth 2 (two) times daily. 04/13/22     oxybutynin (DITROPAN-XL) 10 MG 24 hr tablet Take 1 tablet (10 mg total) by mouth at bedtime. 08/21/21   Salvadore Dom, MD  oxyCODONE-acetaminophen (PERCOCET) 10-325 MG tablet Take 1 tablet by mouth every 8 hours as needed. 11/04/21     oxyCODONE-acetaminophen (PERCOCET) 10-325 MG tablet Take 1 tablet by mouth every 8 (eight) hours as needed. 12/06/21     oxyCODONE-acetaminophen (PERCOCET) 10-325 MG tablet Take 1 tablet by mouth every 8 (eight) hours as needed. 04/08/22     Semaglutide-Weight Management (WEGOVY) 0.5 MG/0.5ML SOAJ Take 0.5 mg by mouth once a week as directed 02/05/22     topiramate (TOPAMAX) 100 MG tablet Take 1 tablet (100 mg total) by mouth 2 (two) times daily. 11/17/21       Physical Exam: Vitals:   04/16/22 2115 04/16/22 2200 04/16/22 2215 04/16/22 2230  BP: 130/63 139/76 130/62 (!) 130/47  Pulse: 77 79 77 79  Resp: (!) 27 20 18  (!) 21  Temp:      TempSrc:      SpO2: 100% 98% 96% 99%     Constitutional: NAD, no pallor or diaphoresis  Eyes: PERTLA, lids and conjunctivae normal ENMT: Mucous membranes are moist. Posterior pharynx clear of any exudate or lesions.   Neck: supple, no masses  Respiratory: no wheezing,  no crackles. No accessory muscle use.  Cardiovascular: S1 & S2 heard, regular rate and rhythm. No extremity edema.   Abdomen: No distension, no tenderness, soft. Bowel sounds active.  Musculoskeletal: no clubbing / cyanosis. No joint deformity upper and lower extremities.   Skin: no significant rashes, lesions, ulcers. Warm, dry, well-perfused. Neurologic: CN 2-12 grossly  intact. Moving all extremities. Alert and oriented.  Psychiatric: Calm. Cooperative.    Labs and Imaging on Admission: I have personally reviewed following labs and imaging studies  CBC: Recent Labs  Lab 04/16/22 1708 04/16/22 1807  WBC 5.3  --   NEUTROABS 1.8  --   HGB 14.3 15.6*  HCT 45.0 46.0  MCV 74.1*  --   PLT 396  --    Basic Metabolic Panel: Recent Labs  Lab 04/16/22 1708 04/16/22 1807  NA 136 139  K 3.7 3.8  CL 100 104  CO2 21*  --   GLUCOSE 158* 153*  BUN 18 21  CREATININE 1.74* 1.80*  CALCIUM 10.8*  --   MG 2.3  --   PHOS 2.7  --    GFR: CrCl cannot be calculated (Unknown ideal weight.). Liver Function Tests: Recent Labs  Lab 04/16/22 1708  AST 34  ALT 19  ALKPHOS 49  BILITOT 0.8  PROT 7.7  ALBUMIN 4.2   No results for input(s): "LIPASE", "AMYLASE" in the last 168 hours. No results for input(s): "AMMONIA" in the last 168 hours. Coagulation Profile: No results for input(s): "INR", "PROTIME" in the last 168 hours. Cardiac Enzymes: No results for input(s): "CKTOTAL", "CKMB", "CKMBINDEX", "TROPONINI" in the last 168 hours. BNP (last 3 results) No results for input(s): "PROBNP" in the last 8760 hours. HbA1C: No results for input(s): "HGBA1C" in the last 72 hours. CBG: Recent Labs  Lab 04/16/22 1658  GLUCAP 168*   Lipid Profile: No results for input(s): "CHOL", "HDL", "LDLCALC", "TRIG", "CHOLHDL", "LDLDIRECT" in the last 72 hours. Thyroid Function Tests: No results for input(s): "TSH", "T4TOTAL", "FREET4", "T3FREE", "THYROIDAB" in the last 72 hours. Anemia Panel: No  results for input(s): "VITAMINB12", "FOLATE", "FERRITIN", "TIBC", "IRON", "RETICCTPCT" in the last 72 hours. Urine analysis:    Component Value Date/Time   COLORURINE YELLOW 10/28/2021 0920   APPEARANCEUR HAZY (A) 10/28/2021 0920   LABSPEC 1.018 10/28/2021 0920   PHURINE 7.0 10/28/2021 0920   GLUCOSEU NEGATIVE 10/28/2021 0920   HGBUR NEGATIVE 10/28/2021 0920   BILIRUBINUR NEGATIVE 10/28/2021 0920   KETONESUR NEGATIVE 10/28/2021 0920   PROTEINUR NEGATIVE 10/28/2021 0920   UROBILINOGEN 1.0 01/18/2010 1310   NITRITE POSITIVE (A) 10/28/2021 0920   LEUKOCYTESUR TRACE (A) 10/28/2021 0920   Sepsis Labs: @LABRCNTIP (procalcitonin:4,lacticidven:4) )No results found for this or any previous visit (from the past 240 hour(s)).   Radiological Exams on Admission: CT ABDOMEN PELVIS W CONTRAST  Result Date: 04/16/2022 CLINICAL DATA:  Abdominal pain, acute, nonlocalized EXAM: CT ABDOMEN AND PELVIS WITH CONTRAST TECHNIQUE: Multidetector CT imaging of the abdomen and pelvis was performed using the standard protocol following bolus administration of intravenous contrast. RADIATION DOSE REDUCTION: This exam was performed according to the departmental dose-optimization program which includes automated exposure control, adjustment of the mA and/or kV according to patient size and/or use of iterative reconstruction technique. CONTRAST:  10mL OMNIPAQUE IOHEXOL 350 MG/ML SOLN COMPARISON:  02/25/2017 FINDINGS: Lower chest: No acute abnormality. Hepatobiliary: No focal liver abnormality is seen. Status post cholecystectomy. No biliary dilatation. Pancreas: Unremarkable Spleen: Unremarkable Adrenals/Urinary Tract: The adrenal glands are unremarkable. The kidneys are normal in size and position. 8 mm nonobstructing calculus noted within the interpolar region of the left kidney. The kidneys are otherwise unremarkable. Bladder is unremarkable. Stomach/Bowel: Gastric lap band device in expected position. Mild descending  colonic diverticulosis without superimposed acute inflammatory change. Stomach, small bowel, and large bowel are otherwise unremarkable. Appendix normal. No free intraperitoneal gas or fluid. Vascular/Lymphatic:  Moderate aortoiliac atherosclerotic calcification. No aortic aneurysm. No pathologic adenopathy within the abdomen and pelvis. Reproductive: Status post hysterectomy. No adnexal masses. Other: No abdominal wall hernia Musculoskeletal: Degenerative changes seen within the lumbar spine. No acute bone abnormality. IMPRESSION: 1. No acute intra-abdominal pathology identified. No definite radiographic explanation for the patient's reported symptoms. 2. Mild left nonobstructing nephrolithiasis. No urolithiasis. No hydronephrosis. 3. Gastric lap band device in expected position. 4. Mild descending colonic diverticulosis without superimposed acute inflammatory change. Electronically Signed   By: Helyn Numbers M.D.   On: 04/16/2022 20:58    EKG: Independently reviewed. Sinus tachycardia, rate 101, PAC, LVH.   Assessment/Plan   1. Syncope   - Check orthostatic vitals, continue cardiac monitoring, check echocardiogram   2. AKI  - SCr is 1.74 on admission, up from baseline of 0.7  - No hydronephrosis on CT in ED  - Likely prerenal in setting of N/V/D and hypotension  - Continue IVF hydration, hold lisinopril-HCTZ, renally-dose medications, repeat chem panel in am   3. N/V/D  - Abdominal exam benign and no acute findings on CT  - Suspected secondary to semaglutide  - Continue IVF hydration, monitor electrolytes, advance diet as she improves    4. Hypotension  - No bleeding, chest pain, or apparent infection  - Likely d/t hypovolemia in setting of N/V/D  - Hold antihypertensives, continue IVF hydration   5. Migraines  - Continue Topamax    6. Depression, anxiety  - Continue home medications    7. Chronic pain  - Prescription database reviewed, continue home regimen    DVT prophylaxis:  sq heparin  Code Status: Full  Level of Care: Level of care: Telemetry Cardiac Family Communication: None present   Disposition Plan:  Patient is from: home  Anticipated d/c is to: Home  Anticipated d/c date is: 1/26 or 04/18/22 Patient currently: pending syncope workup, improved/stable renal function, tolerance of adequate oral intake   Consults called: none  Admission status: Observation     Briscoe Deutscher, MD [facility]  04/16/2022, 11:41 PM

## 2022-04-16 NOTE — ED Provider Notes (Signed)
I saw and evaluated the patient, reviewed the resident's note and I agree with the findings and plan.  EKG Interpretation  Date/Time:  Thursday April 16 2022 16:59:39 EST Ventricular Rate:  101 PR Interval:  130 QRS Duration: 89 QT Interval:  359 QTC Calculation: 466 R Axis:   3 Text Interpretation: Sinus tachycardia Atrial premature complex Abnormal R-wave progression, late transition Left ventricular hypertrophy Confirmed by Lacretia Leigh ([id number]) on 04/16/2022 5:29:08 PM   Patient is EKG per my interpretation shows sinus tachycardia.  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  Patient was initially lethargic and nauseated.  Patient has had decreased oral intake was found to be hypotensive here.  Suspect dehydration.  Will aggressively rehydrate and order laboratory studies including electrolytes.   Lacretia Leigh, MD 04/16/22 1730

## 2022-04-16 NOTE — ED Triage Notes (Signed)
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 Patient Arrives to ED arouseable but very lethargic, nauseated. Recently started [facility] and endorses feeling nauseated every time since she eats since starting the med on Friday.

## 2022-04-16 NOTE — ED Provider Notes (Signed)
[place] Provider Note   CSN: [id number] Arrival date & time: 04/16/22  1656     History  Chief Complaint  Patient presents with   Loss of Consciousness    Samantha Molina is a 62 y.o. female.  Samantha Molina is a 62 yo F PMH IIDM currently on Wagovy for weight loss.  Patient states that she has had poor p.o. intake since beginning [facility] with persistent nausea today she began having several episodes of emesis she tried to attempt to come to work.  While at work she was having several episodes of emesis during 1 episode emesis she lost consciousness in the bathroom.  On arrival to the emergency department patient complaining of generalized abdominal tenderness and nausea.   Loss of Consciousness      Home Medications Prior to Admission medications   Medication Sig Start Date End Date Taking? Authorizing Provider  acetaminophen (TYLENOL) 650 MG CR tablet Take 650 mg by mouth every 8 (eight) hours as needed for pain.    [provider]  ALPRAZolam Duanne Moron) 0.25 MG tablet Take 0.5-1 tablets (0.125-0.25 mg total) by mouth daily as needed. Patient taking differently: Take 0.125-0.25 mg by mouth daily as needed for anxiety. 10/15/20     amLODipine (NORVASC) 5 MG tablet Take 1 tablet (5 mg total) by mouth daily. 10/29/21   Georgette Shell, MD  amLODipine (NORVASC) 5 MG tablet Take 1 tablet (5 mg total) by mouth once a day. 11/07/21 05/06/22    aspirin 81 MG chewable tablet Chew 1 tablet (81 mg total) by mouth daily. 11/04/21   Loel Dubonnet, NP  atorvastatin (LIPITOR) 10 MG tablet Take 1 tablet (10 mg total) by mouth daily. 02/05/22     cephALEXin (KEFLEX) 250 MG capsule Take 1 capsule (250 mg total) by mouth daily. 01/29/22     desvenlafaxine (PRISTIQ) 50 MG 24 hr tablet Take 1 tablet (50 mg total) by mouth daily. 03/17/22     lisinopril-hydrochlorothiazide (ZESTORETIC) 20-25 MG tablet Take 1 tablet by mouth daily.  03/17/22     Multiple Vitamins-Minerals (MULTIVITAMIN WITH MINERALS) tablet Take 1 tablet by mouth daily.    [provider]  ondansetron (ZOFRAN-ODT) 4 MG disintegrating tablet Dissolve 2 tablets (8 mg total) by mouth 2 (two) times daily. 04/13/22     oxybutynin (DITROPAN-XL) 10 MG 24 hr tablet Take 1 tablet (10 mg total) by mouth at bedtime. 08/21/21   Salvadore Dom, MD  oxyCODONE-acetaminophen (PERCOCET) 10-325 MG tablet Take 1 tablet by mouth every 8 hours as needed. 11/04/21     oxyCODONE-acetaminophen (PERCOCET) 10-325 MG tablet Take 1 tablet by mouth every 8 (eight) hours as needed. 12/06/21     oxyCODONE-acetaminophen (PERCOCET) 10-325 MG tablet Take 1 tablet by mouth every 8 (eight) hours as needed. 04/08/22     Semaglutide-Weight Management (WEGOVY) 0.5 MG/0.5ML SOAJ Take 0.5 mg by mouth once a week as directed 02/05/22     topiramate (TOPAMAX) 100 MG tablet Take 1 tablet (100 mg total) by mouth 2 (two) times daily. 11/17/21         Allergies    Isosorbide mononitrate [isosorbide nitrate]    Review of Systems   Review of Systems  Cardiovascular:  Positive for syncope.    Physical Exam Updated Vital Signs BP (!) 130/47   Pulse 79   Temp (!) 97.4 F (36.3 C) (Temporal)   Resp (!) 21   SpO2 99%  Physical Exam  Vitals and nursing note reviewed.  Constitutional:      General: She is not in acute distress.    Appearance: She is well-developed. She is ill-appearing. She is not toxic-appearing.  HENT:     Head: Normocephalic and atraumatic.     Mouth/Throat:     Mouth: Mucous membranes are dry.  Eyes:     Conjunctiva/sclera: Conjunctivae normal.  Cardiovascular:     Rate and Rhythm: Regular rhythm. Tachycardia present.     Heart sounds: No murmur heard. Pulmonary:     Effort: Pulmonary effort is normal. No respiratory distress.     Breath sounds: Normal breath sounds.  Abdominal:     Palpations: Abdomen is soft.     Tenderness: There is generalized abdominal  tenderness.  Musculoskeletal:        General: No swelling.     Cervical back: Neck supple.     Right lower leg: No edema.     Left lower leg: No edema.  Skin:    General: Skin is warm and dry.     Capillary Refill: Capillary refill takes less than 2 seconds.  Neurological:     General: No focal deficit present.     Mental Status: She is alert.  Psychiatric:        Mood and Affect: Mood normal.     ED Results / Procedures / Treatments   Labs (all labs ordered are listed, but only abnormal results are displayed) Labs Reviewed  CBC WITH DIFFERENTIAL/PLATELET - Abnormal; Notable for the following components:      Result Value   RBC 6.07 (*)    MCV 74.1 (*)    MCH 23.6 (*)    All other components within normal limits  COMPREHENSIVE METABOLIC PANEL - Abnormal; Notable for the following components:   CO2 21 (*)    Glucose, Bld 158 (*)    Creatinine, Ser 1.74 (*)    Calcium 10.8 (*)    GFR, Estimated 33 (*)    All other components within normal limits  URINALYSIS, ROUTINE W REFLEX MICROSCOPIC - Abnormal; Notable for the following components:   Color, Urine AMBER (*)    APPearance CLOUDY (*)    Hgb urine dipstick LARGE (*)    Protein, ur 30 (*)    Leukocytes,Ua MODERATE (*)    Bacteria, UA RARE (*)    All other components within normal limits  CBG MONITORING, ED - Abnormal; Notable for the following components:   Glucose-Capillary 168 (*)    All other components within normal limits  I-STAT CHEM 8, ED - Abnormal; Notable for the following components:   Creatinine, Ser 1.80 (*)    Glucose, Bld 153 (*)    Hemoglobin 15.6 (*)    All other components within normal limits  MAGNESIUM  PHOSPHORUS  BASIC METABOLIC PANEL  CBC  TROPONIN I (HIGH SENSITIVITY)  TROPONIN I (HIGH SENSITIVITY)    EKG EKG Interpretation  Date/Time:  Thursday April 16 2022 16:59:39 EST Ventricular Rate:  101 PR Interval:  130 QRS Duration: 89 QT Interval:  359 QTC Calculation: 466 R  Axis:   3 Text Interpretation: Sinus tachycardia Atrial premature complex Abnormal R-wave progression, late transition Left ventricular hypertrophy Confirmed by Lacretia Leigh ([id number]) on 04/16/2022 5:29:08 PM  Radiology CT ABDOMEN PELVIS W CONTRAST  Result Date: 04/16/2022 CLINICAL DATA:  Abdominal pain, acute, nonlocalized EXAM: CT ABDOMEN AND PELVIS WITH CONTRAST TECHNIQUE: Multidetector CT imaging of the abdomen and pelvis was performed using the standard protocol following  bolus administration of intravenous contrast. RADIATION DOSE REDUCTION: This exam was performed according to the departmental dose-optimization program which includes automated exposure control, adjustment of the mA and/or kV according to patient size and/or use of iterative reconstruction technique. CONTRAST:  59mL OMNIPAQUE IOHEXOL 350 MG/ML SOLN COMPARISON:  02/25/2017 FINDINGS: Lower chest: No acute abnormality. Hepatobiliary: No focal liver abnormality is seen. Status post cholecystectomy. No biliary dilatation. Pancreas: Unremarkable Spleen: Unremarkable Adrenals/Urinary Tract: The adrenal glands are unremarkable. The kidneys are normal in size and position. 8 mm nonobstructing calculus noted within the interpolar region of the left kidney. The kidneys are otherwise unremarkable. Bladder is unremarkable. Stomach/Bowel: Gastric lap band device in expected position. Mild descending colonic diverticulosis without superimposed acute inflammatory change. Stomach, small bowel, and large bowel are otherwise unremarkable. Appendix normal. No free intraperitoneal gas or fluid. Vascular/Lymphatic: Moderate aortoiliac atherosclerotic calcification. No aortic aneurysm. No pathologic adenopathy within the abdomen and pelvis. Reproductive: Status post hysterectomy. No adnexal masses. Other: No abdominal wall hernia Musculoskeletal: Degenerative changes seen within the lumbar spine. No acute bone abnormality. IMPRESSION: 1. No acute  intra-abdominal pathology identified. No definite radiographic explanation for the patient's reported symptoms. 2. Mild left nonobstructing nephrolithiasis. No urolithiasis. No hydronephrosis. 3. Gastric lap band device in expected position. 4. Mild descending colonic diverticulosis without superimposed acute inflammatory change. Electronically Signed   By: Helyn Numbers M.D.   On: 04/16/2022 20:58    Procedures Procedures    Medications Ordered in ED Medications  lactated ringers infusion (has no administration in time range)  aspirin chewable tablet 81 mg (has no administration in time range)  atorvastatin (LIPITOR) tablet 10 mg (has no administration in time range)  ALPRAZolam (XANAX) tablet 0.125-0.25 mg (has no administration in time range)  venlafaxine XR (EFFEXOR-XR) 24 hr capsule 75 mg (has no administration in time range)  topiramate (TOPAMAX) tablet 100 mg (has no administration in time range)  heparin injection 5,000 Units (has no administration in time range)  sodium chloride flush (NS) 0.9 % injection 3 mL (has no administration in time range)  acetaminophen (TYLENOL) tablet 650 mg (has no administration in time range)    Or  acetaminophen (TYLENOL) suppository 650 mg (has no administration in time range)  ondansetron (ZOFRAN) tablet 4 mg (has no administration in time range)    Or  ondansetron (ZOFRAN) injection 4 mg (has no administration in time range)  lactated ringers bolus 1,000 mL (0 mLs Intravenous Stopped 04/16/22 1817)  ondansetron (ZOFRAN) injection 4 mg (4 mg Intravenous Given 04/16/22 1724)  lactated ringers bolus 1,000 mL (0 mLs Intravenous Stopped 04/16/22 1817)  lactated ringers bolus 500 mL (0 mLs Intravenous Stopped 04/16/22 2036)  ondansetron (ZOFRAN) injection 4 mg (4 mg Intravenous Given 04/16/22 2018)  iohexol (OMNIPAQUE) 350 MG/ML injection 75 mL (75 mLs Intravenous Contrast Given 04/16/22 2049)    ED Course/ Medical Decision Making/ A&P                              Medical Decision Making Samantha Molina is a 62 year old female past medical history as documented above presenting to the emergency department for emesis with loss of consciousness.  On arrival to the emergency department patient is normotensive blood pressures in the systolics 130s she is not tachycardic heart rate in the 70s she is slightly tachypneic but satting at 100% on room air.  Initial physical exam is notable for an uncomfortable appearing female she is alert  and oriented x 4 she is not complaining of any acute pain specifically no complaints of chest pain, abdominal pain, shortness of breath.  Recently, patient has recently started [facility].  Given her emesis with loss of consciousness it is highly probable that she had an episode of emesis leading to increased vagal tone and loss of consciousness.  Initially, patient was not complaining of any abdominal tenderness and had a nonfocal abdominal exam.  My initial differential was [facility] associated nausea, gastroparesis, small bowel obstruction.  We were also considering atypical presentation for ACS.  Also considering aortic dissection.  Bedside POCUS ultrasound shows no evidence of aortic dilatation.  Initially patient's blood pressure was stable.  On reassessment patient did have an episode of hypotension with blood pressure dropping to 79/45.  This blood pressure was taken immediately after an episode of emesis.  Patient's emesis is nonbloody nor does it have a coffee-ground appearance.  Given this fact I am less concerned for a upper GI bleed. However, given hx of bariatric surgery we opted to obtain CT abdomen and pelvis. CT abdomen and pelvis shows no intraabdominal abnormalities.  Labs notable for elevate creatinine, most likely pre-renal in the setting of poor PO tolerance 2/2 to [facility].  CBC unremarkable. Trop neg. Patient admitted to hospitalist for management of nausea and vomiting.   Amount and/or Complexity of Data  Reviewed Labs: ordered. Radiology: ordered.  Risk Prescription drug management. Decision regarding hospitalization.          Final Clinical Impression(s) / ED Diagnoses Final diagnoses:  Nausea and vomiting, unspecified vomiting type    Rx / DC Orders ED Discharge Orders     None         Donzetta Matters, MD 04/16/22 2352    Lacretia Leigh, MD 04/18/22 1351

## 2022-04-17 ENCOUNTER — Observation Stay (HOSPITAL_COMMUNITY): Payer: Commercial Managed Care - PPO

## 2022-04-17 ENCOUNTER — Ambulatory Visit: Admitting: Psychology

## 2022-04-17 DIAGNOSIS — E861 Hypovolemia: Secondary | ICD-10-CM | POA: Diagnosis not present

## 2022-04-17 DIAGNOSIS — Z9884 Bariatric surgery status: Secondary | ICD-10-CM | POA: Diagnosis not present

## 2022-04-17 DIAGNOSIS — I9589 Other hypotension: Secondary | ICD-10-CM | POA: Diagnosis not present

## 2022-04-17 DIAGNOSIS — N179 Acute kidney failure, unspecified: Secondary | ICD-10-CM | POA: Diagnosis not present

## 2022-04-17 DIAGNOSIS — Z8261 Family history of arthritis: Secondary | ICD-10-CM | POA: Diagnosis not present

## 2022-04-17 DIAGNOSIS — R112 Nausea with vomiting, unspecified: Secondary | ICD-10-CM

## 2022-04-17 DIAGNOSIS — Z823 Family history of stroke: Secondary | ICD-10-CM | POA: Diagnosis not present

## 2022-04-17 DIAGNOSIS — G894 Chronic pain syndrome: Secondary | ICD-10-CM | POA: Diagnosis not present

## 2022-04-17 DIAGNOSIS — Z832 Family history of diseases of the blood and blood-forming organs and certain disorders involving the immune mechanism: Secondary | ICD-10-CM | POA: Diagnosis not present

## 2022-04-17 DIAGNOSIS — G43009 Migraine without aura, not intractable, without status migrainosus: Secondary | ICD-10-CM | POA: Diagnosis present

## 2022-04-17 DIAGNOSIS — R55 Syncope and collapse: Secondary | ICD-10-CM

## 2022-04-17 DIAGNOSIS — E876 Hypokalemia: Secondary | ICD-10-CM | POA: Diagnosis not present

## 2022-04-17 DIAGNOSIS — I10 Essential (primary) hypertension: Secondary | ICD-10-CM | POA: Diagnosis not present

## 2022-04-17 DIAGNOSIS — E860 Dehydration: Secondary | ICD-10-CM | POA: Diagnosis not present

## 2022-04-17 DIAGNOSIS — R1115 Cyclical vomiting syndrome unrelated to migraine: Secondary | ICD-10-CM | POA: Diagnosis not present

## 2022-04-17 DIAGNOSIS — F419 Anxiety disorder, unspecified: Secondary | ICD-10-CM | POA: Diagnosis present

## 2022-04-17 DIAGNOSIS — Z803 Family history of malignant neoplasm of breast: Secondary | ICD-10-CM | POA: Diagnosis not present

## 2022-04-17 DIAGNOSIS — F320 Major depressive disorder, single episode, mild: Secondary | ICD-10-CM | POA: Diagnosis not present

## 2022-04-17 DIAGNOSIS — E119 Type 2 diabetes mellitus without complications: Secondary | ICD-10-CM | POA: Diagnosis not present

## 2022-04-17 DIAGNOSIS — Z888 Allergy status to other drugs, medicaments and biological substances status: Secondary | ICD-10-CM | POA: Diagnosis not present

## 2022-04-17 DIAGNOSIS — Z833 Family history of diabetes mellitus: Secondary | ICD-10-CM | POA: Diagnosis not present

## 2022-04-17 DIAGNOSIS — I2510 Atherosclerotic heart disease of native coronary artery without angina pectoris: Secondary | ICD-10-CM | POA: Diagnosis present

## 2022-04-17 DIAGNOSIS — Z83438 Family history of other disorder of lipoprotein metabolism and other lipidemia: Secondary | ICD-10-CM | POA: Diagnosis not present

## 2022-04-17 DIAGNOSIS — Z807 Family history of other malignant neoplasms of lymphoid, hematopoietic and related tissues: Secondary | ICD-10-CM | POA: Diagnosis not present

## 2022-04-17 DIAGNOSIS — R509 Fever, unspecified: Secondary | ICD-10-CM | POA: Diagnosis not present

## 2022-04-17 DIAGNOSIS — Z8249 Family history of ischemic heart disease and other diseases of the circulatory system: Secondary | ICD-10-CM | POA: Diagnosis not present

## 2022-04-17 LAB — CBC
HCT: 40.4 % (ref 36.0–46.0)
Hemoglobin: 12.7 g/dL (ref 12.0–15.0)
MCH: 23.5 pg — ABNORMAL LOW (ref 26.0–34.0)
MCHC: 31.4 g/dL (ref 30.0–36.0)
MCV: 74.7 fL — ABNORMAL LOW (ref 80.0–100.0)
Platelets: 317 10*3/uL (ref 150–400)
RBC: 5.41 MIL/uL — ABNORMAL HIGH (ref 3.87–5.11)
RDW: 14.8 % (ref 11.5–15.5)
WBC: 11.3 10*3/uL — ABNORMAL HIGH (ref 4.0–10.5)
nRBC: 0 % (ref 0.0–0.2)

## 2022-04-17 LAB — ECHOCARDIOGRAM COMPLETE
AR max vel: 1.72 cm2
AV Area VTI: 1.74 cm2
AV Area mean vel: 1.69 cm2
AV Mean grad: 7 mmHg
AV Peak grad: 13.1 mmHg
Ao pk vel: 1.81 m/s
Area-P 1/2: 2.55 cm2
Height: 61 in
MV VTI: 2.01 cm2
S' Lateral: 3.1 cm
Weight: 3065.6 oz

## 2022-04-17 LAB — BASIC METABOLIC PANEL
Anion gap: 10 (ref 5–15)
BUN: 17 mg/dL (ref 8–23)
CO2: 26 mmol/L (ref 22–32)
Calcium: 9.7 mg/dL (ref 8.9–10.3)
Chloride: 103 mmol/L (ref 98–111)
Creatinine, Ser: 1.58 mg/dL — ABNORMAL HIGH (ref 0.44–1.00)
GFR, Estimated: 37 mL/min — ABNORMAL LOW (ref >60)
Glucose, Bld: 106 mg/dL — ABNORMAL HIGH (ref 70–99)
Potassium: 3 mmol/L — ABNORMAL LOW (ref 3.5–5.1)
Sodium: 139 mmol/L (ref 135–145)

## 2022-04-17 MED ORDER — SODIUM CHLORIDE 0.9 % IV SOLN
12.5000 mg | Freq: Four times a day (QID) | INTRAVENOUS | Status: DC | PRN
  Administered 2022-04-17: 12.5 mg via INTRAVENOUS
  Filled 2022-04-17: qty 0.5

## 2022-04-17 MED ORDER — POTASSIUM CHLORIDE CRYS ER 20 MEQ PO TBCR
40.0000 meq | EXTENDED_RELEASE_TABLET | Freq: Two times a day (BID) | ORAL | Status: DC
  Administered 2022-04-17: 40 meq via ORAL
  Filled 2022-04-17 (×2): qty 2

## 2022-04-17 NOTE — ED Notes (Signed)
Pt given a cool cloth for forehead and back of neck lights turned down per request

## 2022-04-17 NOTE — Progress Notes (Signed)
PROGRESS NOTE    Samantha Molina  OEU:[id number] DOB: 08-24-1960 DOA: 04/16/2022 PCP: Donald Prose, MD    Brief Narrative:  62 year old with history of hypertension, mild obstructive coronary artery disease, chronic pain syndrome, migraines and obesity status post gastric banding who was recently started on Wegovy for weight loss and received first dose last week admitted with 2 days of persistent nausea, mild generalized abdominal pain and discomfort, syncopal episode at work.  No recent antibiotic use.  She also had 4 loose bowel movement yesterday but none today.  In the emergency room initially blood pressures were stable.  EKG with sinus tachycardia with PACs.  CT scan abdomen pelvis was essentially normal.  Troponins negative.  Creatinine 1.74 with recent normal creatinine.  Patient was found clinical dehydrated.  Received aggressive IV fluids and admitted to the hospital.   Assessment & Plan:   Intractable nausea vomiting: Likely secondary to [facility].  Does not have any infective signs of diarrhea. No more loose stool. Persistently symptomatic. Liquid diet, adequate nausea medications, continue maintenance IV fluids.  Correct electrolytes.  Moderate dehydration with acute kidney injury, hypokalemia Presyncopal episode due to dehydration Due to above.  Some clinical improvement today.  Urine output is adequate.  Continue maintenance IV fluids today.  Replace potassium by mouth.  Recheck levels tomorrow morning.  Recheck creatinine tomorrow morning.  Essential hypertension: Blood pressures low normal.  Hold all antihypertensives.  Chronic medical issues including Chronic migraine, on Topamax Depression and anxiety, takes venlafaxine. CAD, on aspirin and statin. Chronic pain syndrome, on oxycodone at home that is continued.   DVT prophylaxis: heparin injection 5,000 Units Start: 04/16/22 2345   Code Status: Full code Family Communication: None at the bedside Disposition  Plan: Status is: Inpatient Remains inpatient appropriate because: Persistent nausea.  Not tolerating diet.     Consultants:  None  Procedures:  None  Antimicrobials:  None   Subjective: Patient seen in the morning rounds.  She still has nausea but no vomiting.  No more bowel movement since coming to the emergency room.  Abdominal pain is mostly improved.  Very reluctant to try diet.  Objective: Vitals:   04/17/22 1045 04/17/22 1130 04/17/22 1143 04/17/22 1255  BP:  135/69  124/77  Pulse: 71 97  70  Resp: (!) 23 18  18   Temp:   98 F (36.7 C) 98.6 F (37 C)  TempSrc:    Oral  SpO2: 100% 97%  98%  Weight:    86.9 kg  Height:    5\' 1"  (1.549 m)    Intake/Output Summary (Last 24 hours) at 04/17/2022 1341 Last data filed at 04/17/2022 0049 Gross per 24 hour  Intake 421.67 ml  Output --  Net 421.67 ml   Filed Weights   04/17/22 0533 04/17/22 1255  Weight: 90.5 kg 86.9 kg    Examination:  General exam: Appears calm and comfortable at rest. Mucous membranes are dry. Respiratory system: No added sounds. Cardiovascular system: S1 & S2 heard, RRR. No pedal edema. Gastrointestinal system: Soft.  Nontender.  Bowel sound present. Central nervous system: Alert and oriented. No focal neurological deficits. Extremities: Symmetric 5 x 5 power. Skin: No rashes, lesions or ulcers Psychiatry: Judgement and insight appear normal. Mood & affect appropriate.     Data Reviewed: I have personally reviewed following labs and imaging studies  CBC: Recent Labs  Lab 04/16/22 1708 04/16/22 1807 04/17/22 0222  WBC 5.3  --  11.3*  NEUTROABS 1.8  --   --  HGB 14.3 15.6* 12.7  HCT 45.0 46.0 40.4  MCV 74.1*  --  74.7*  PLT 396  --  381   Basic Metabolic Panel: Recent Labs  Lab 04/16/22 1708 04/16/22 1807 04/17/22 0222  NA 136 139 139  K 3.7 3.8 3.0*  CL 100 104 103  CO2 21*  --  26  GLUCOSE 158* 153* 106*  BUN 18 21 17   CREATININE 1.74* 1.80* 1.58*  CALCIUM 10.8*  --   9.7  MG 2.3  --   --   PHOS 2.7  --   --    GFR: Estimated Creatinine Clearance: 37.4 mL/min (A) (by C-G formula based on SCr of 1.58 mg/dL (H)). Liver Function Tests: Recent Labs  Lab 04/16/22 1708  AST 34  ALT 19  ALKPHOS 49  BILITOT 0.8  PROT 7.7  ALBUMIN 4.2   No results for input(s): "LIPASE", "AMYLASE" in the last 168 hours. No results for input(s): "AMMONIA" in the last 168 hours. Coagulation Profile: No results for input(s): "INR", "PROTIME" in the last 168 hours. Cardiac Enzymes: No results for input(s): "CKTOTAL", "CKMB", "CKMBINDEX", "TROPONINI" in the last 168 hours. BNP (last 3 results) No results for input(s): "PROBNP" in the last 8760 hours. HbA1C: No results for input(s): "HGBA1C" in the last 72 hours. CBG: Recent Labs  Lab 04/16/22 1658  GLUCAP 168*   Lipid Profile: No results for input(s): "CHOL", "HDL", "LDLCALC", "TRIG", "CHOLHDL", "LDLDIRECT" in the last 72 hours. Thyroid Function Tests: No results for input(s): "TSH", "T4TOTAL", "FREET4", "T3FREE", "THYROIDAB" in the last 72 hours. Anemia Panel: No results for input(s): "VITAMINB12", "FOLATE", "FERRITIN", "TIBC", "IRON", "RETICCTPCT" in the last 72 hours. Sepsis Labs: No results for input(s): "PROCALCITON", "LATICACIDVEN" in the last 168 hours.  No results found for this or any previous visit (from the past 240 hour(s)).       Radiology Studies: CT ABDOMEN PELVIS W CONTRAST  Result Date: 04/16/2022 CLINICAL DATA:  Abdominal pain, acute, nonlocalized EXAM: CT ABDOMEN AND PELVIS WITH CONTRAST TECHNIQUE: Multidetector CT imaging of the abdomen and pelvis was performed using the standard protocol following bolus administration of intravenous contrast. RADIATION DOSE REDUCTION: This exam was performed according to the departmental dose-optimization program which includes automated exposure control, adjustment of the mA and/or kV according to patient size and/or use of iterative reconstruction  technique. CONTRAST:  72mL OMNIPAQUE IOHEXOL 350 MG/ML SOLN COMPARISON:  02/25/2017 FINDINGS: Lower chest: No acute abnormality. Hepatobiliary: No focal liver abnormality is seen. Status post cholecystectomy. No biliary dilatation. Pancreas: Unremarkable Spleen: Unremarkable Adrenals/Urinary Tract: The adrenal glands are unremarkable. The kidneys are normal in size and position. 8 mm nonobstructing calculus noted within the interpolar region of the left kidney. The kidneys are otherwise unremarkable. Bladder is unremarkable. Stomach/Bowel: Gastric lap band device in expected position. Mild descending colonic diverticulosis without superimposed acute inflammatory change. Stomach, small bowel, and large bowel are otherwise unremarkable. Appendix normal. No free intraperitoneal gas or fluid. Vascular/Lymphatic: Moderate aortoiliac atherosclerotic calcification. No aortic aneurysm. No pathologic adenopathy within the abdomen and pelvis. Reproductive: Status post hysterectomy. No adnexal masses. Other: No abdominal wall hernia Musculoskeletal: Degenerative changes seen within the lumbar spine. No acute bone abnormality. IMPRESSION: 1. No acute intra-abdominal pathology identified. No definite radiographic explanation for the patient's reported symptoms. 2. Mild left nonobstructing nephrolithiasis. No urolithiasis. No hydronephrosis. 3. Gastric lap band device in expected position. 4. Mild descending colonic diverticulosis without superimposed acute inflammatory change. Electronically Signed   By: Fidela Salisbury M.D.   On:  04/16/2022 20:58        Scheduled Meds:  aspirin  81 mg Oral Daily   atorvastatin  10 mg Oral Daily   heparin  5,000 Units Subcutaneous Q8H   potassium chloride  40 mEq Oral BID   sodium chloride flush  3 mL Intravenous Q12H   topiramate  100 mg Oral BID   venlafaxine XR  75 mg Oral Q breakfast   Continuous Infusions:  lactated ringers 100 mL/hr at 04/17/22 0230   promethazine  (PHENERGAN) injection (IM or IVPB)       LOS: 0 days    Time spent: 35 minutes    Dorcas Carrow, MD [facility] Pager [phone]

## 2022-04-17 NOTE — Care Management (Signed)
  Transition of Care [facility]) Screening Note   Patient Details  Name: Samantha Molina Date of Birth: [birth year]   Transition of Care [facility]) CM/SW Contact:    Bethena Roys, RN Phone Number: 04/17/2022, 1:51 PM    Transition of Care Department [facility]) has reviewed the patient and no TOC needs have been identified at this time. Patient presented for persistent nausea, generalized abdominal pain, and syncopal episode. TOC will continue to monitor patient advancement through interdisciplinary progression rounds. If new patient transition needs arise, please place a TOC consult.

## 2022-04-17 NOTE — ED Notes (Signed)
Pt resting with eyes closed confirmed chest rise and fall  

## 2022-04-17 NOTE — ED Notes (Signed)
Pt ambulated to bathroom breakfast tray at bedside

## 2022-04-17 NOTE — ED Notes (Signed)
ED TO INPATIENT HANDOFF REPORT  ED Nurse Name and Phone #:  Vincente Liberty [phone]  S Name/Age/Gender Samantha Molina 62 y.o. female Room/Bed: 041C/041C  Code Status   Code Status: Full Code  Home/SNF/Other Home Patient oriented to: self, place, time, and situation Is this baseline? 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 Patient Arrives to ED arouseable but very lethargic, nauseated. Recently started [facility] and endorses feeling nauseated every time since she eats since starting the med on Friday.    Allergies Allergies  Allergen Reactions   Isosorbide Mononitrate [Isosorbide Nitrate] Other (See Comments)    Headache    Level of Care/Admitting Diagnosis ED Disposition     ED Disposition  Admit   Condition  --   [place]: [place] [[id number]]  Level of Care: Telemetry Cardiac [103]  May place patient in observation at [facility] or [place] if equivalent level of care is available:: Yes  Covid Evaluation: Asymptomatic - no recent exposure (last 10 days) testing not required  Diagnosis: Syncope [206001]  Admitting Physician: Vianne Bulls [[id number]]  Attending Physician: Vianne Bulls [[id number]]          B Medical/Surgery History Past Medical History:  Diagnosis Date   Arthritis    Arthritis    both knees   Chronic leg pain    Coronary artery disease    Depression    Diabetes mellitus without complication ([place])    Heart murmur    as a child   Hypertension    Migraine without aura    Morbid obesity ([place])    Urinary incontinence    Vitamin D deficiency disease 2013   Past Surgical History:  Procedure Laterality Date   JOINT REPLACEMENT Left 2022   Knee   KNEE ARTHROSCOPY W/ ACL RECONSTRUCTION Left 2004   LAPAROSCOPIC CHOLECYSTECTOMY  2005   LAPAROSCOPIC GASTRIC BANDING   2007   TONSILLECTOMY  1984   Age 71   TOTAL VAGINAL HYSTERECTOMY  age 67   Ovaries remain, for menorrhagia   TUBAL LIGATION       A IV Location/Drains/Wounds Patient Lines/Drains/Airways Status     Active Line/Drains/Airways     Name Placement date Placement time Site Days   Peripheral IV 04/16/22 20 G Anterior;Right Forearm 04/16/22  1710  Forearm  1   Peripheral IV 04/16/22 22 G Anterior;Left Forearm 04/16/22  1710  Forearm  1            Intake/Output Last 24 hours  Intake/Output Summary (Last 24 hours) at 04/17/2022 1041 Last data filed at 04/17/2022 0049 Gross per 24 hour  Intake 421.67 ml  Output --  Net 421.67 ml    Labs/Imaging Results for orders placed or performed during the hospital encounter of 04/16/22 (from the past 48 hour(s))  CBG monitoring, ED     Status: Abnormal   Collection Time: 04/16/22  4:58 PM  Result Value Ref Range   Glucose-Capillary 168 (H) 70 - 99 mg/dL    Comment: Glucose reference range applies only to samples taken after fasting for at least 8 hours.  CBC with Differential     Status: Abnormal   Collection Time: 04/16/22  5:08 PM  Result Value Ref Range   WBC 5.3 4.0 - 10.5 K/uL   RBC 6.07 (H) 3.87 - 5.11 MIL/uL   Hemoglobin  14.3 12.0 - 15.0 g/dL   HCT 12.8 78.6 - 76.7 %   MCV 74.1 (L) 80.0 - 100.0 fL   MCH 23.6 (L) 26.0 - 34.0 pg   MCHC 31.8 30.0 - 36.0 g/dL   RDW 20.9 47.0 - 96.2 %   Platelets 396 150 - 400 K/uL   nRBC 0.0 0.0 - 0.2 %   Neutrophils Relative % 34 %   Neutro Abs 1.8 1.7 - 7.7 K/uL   Lymphocytes Relative 54 %   Lymphs Abs 2.9 0.7 - 4.0 K/uL   Monocytes Relative 9 %   Monocytes Absolute 0.5 0.1 - 1.0 K/uL   Eosinophils Relative 1 %   Eosinophils Absolute 0.1 0.0 - 0.5 K/uL   Basophils Relative 1 %   Basophils Absolute 0.0 0.0 - 0.1 K/uL   Immature Granulocytes 1 %   Abs Immature Granulocytes 0.04 0.00 - 0.07 K/uL    Comment: Performed at [facility], [address], [place], [place] [address]   Comprehensive metabolic panel     Status: Abnormal   Collection Time: 04/16/22  5:08 PM  Result Value Ref Range   Sodium 136 135 - 145 mmol/L   Potassium 3.7 3.5 - 5.1 mmol/L    Comment: HEMOLYSIS AT THIS LEVEL MAY AFFECT RESULT   Chloride 100 98 - 111 mmol/L   CO2 21 (L) 22 - 32 mmol/L   Glucose, Bld 158 (H) 70 - 99 mg/dL    Comment: Glucose reference range applies only to samples taken after fasting for at least 8 hours.   BUN 18 8 - 23 mg/dL   Creatinine, Ser 9.47 (H) 0.44 - 1.00 mg/dL   Calcium 65.4 (H) 8.9 - 10.3 mg/dL   Total Protein 7.7 6.5 - 8.1 g/dL   Albumin 4.2 3.5 - 5.0 g/dL   AST 34 15 - 41 U/L    Comment: HEMOLYSIS AT THIS LEVEL MAY AFFECT RESULT   ALT 19 0 - 44 U/L    Comment: HEMOLYSIS AT THIS LEVEL MAY AFFECT RESULT   Alkaline Phosphatase 49 38 - 126 U/L   Total Bilirubin 0.8 0.3 - 1.2 mg/dL    Comment: HEMOLYSIS AT THIS LEVEL MAY AFFECT RESULT   GFR, Estimated 33 (L) >60 mL/min    Comment: (NOTE) Calculated using the CKD-EPI Creatinine Equation (2021)    Anion gap 15 5 - 15    Comment: Performed at [facility], [address], [place], [place] [address]  Magnesium     Status: None   Collection Time: 04/16/22  5:08 PM  Result Value Ref Range   Magnesium 2.3 1.7 - 2.4 mg/dL    Comment: Performed at [facility], [address] [place], [place] [address]  Phosphorus     Status: None   Collection Time: 04/16/22  5:08 PM  Result Value Ref Range   Phosphorus 2.7 2.5 - 4.6 mg/dL    Comment: Performed at [facility], [address], [place], [place] [address]  Troponin I (High Sensitivity)     Status: None   Collection Time: 04/16/22  5:08 PM  Result Value Ref Range   Troponin I (High Sensitivity) 7 <18 ng/L    Comment: (NOTE) Elevated high sensitivity troponin I (hsTnI) values and significant  changes across serial measurements may suggest ACS but many other  chronic and acute conditions are known to elevate hsTnI results.  Refer to the  "Links" section for chest pain algorithms and additional  guidance. Performed at [facility], [address] [place], [place] [address]   I-stat chem 8, ED (not at [facility], [facility] or [facility])     Status: Abnormal   Collection Time: 04/16/22  6:07 PM  Result Value Ref Range   Sodium 139 135 - 145 mmol/L   Potassium 3.8 3.5 - 5.1 mmol/L   Chloride 104 98 - 111 mmol/L   BUN 21 8 - 23 mg/dL   Creatinine, Ser 5.64 (H) 0.44 - 1.00 mg/dL   Glucose, Bld 332 (H) 70 - 99 mg/dL    Comment: Glucose reference range applies only to samples taken after fasting for at least 8 hours.   Calcium, Ion 1.17 1.15 - 1.40 mmol/L   TCO2 24 22 - 32 mmol/L   Hemoglobin 15.6 (H) 12.0 - 15.0 g/dL   HCT 95.1 88.4 - 16.6 %  Troponin I (High Sensitivity)     Status: None   Collection Time: 04/16/22  8:00 PM  Result Value Ref Range   Troponin I (High Sensitivity) 7 <18 ng/L    Comment: (NOTE) Elevated high sensitivity troponin I (hsTnI) values and significant  changes across serial measurements may suggest ACS but many other  chronic and acute conditions are known to elevate hsTnI results.  Refer to the "Links" section for chest pain algorithms and additional  guidance. Performed at [facility], [address] [place], [place] [address]   Urinalysis, Routine w reflex microscopic -Urine, Clean Catch     Status: Abnormal   Collection Time: 04/16/22 11:21 PM  Result Value Ref Range   Color, Urine AMBER (A) YELLOW    Comment: BIOCHEMICALS MAY BE AFFECTED BY COLOR   APPearance CLOUDY (A) CLEAR   Specific Gravity, Urine 1.030 1.005 - 1.030   pH 5.0 5.0 - 8.0   Glucose, UA NEGATIVE NEGATIVE mg/dL   Hgb urine dipstick LARGE (A) NEGATIVE   Bilirubin Urine NEGATIVE NEGATIVE   Ketones, ur NEGATIVE NEGATIVE mg/dL   Protein, ur 30 (A) NEGATIVE mg/dL   Nitrite NEGATIVE NEGATIVE   Leukocytes,Ua MODERATE (A) NEGATIVE   RBC / HPF >50 0 - 5 RBC/hpf   WBC, UA 21-50 0 - 5 WBC/hpf   Bacteria, UA RARE (A) NONE SEEN    Squamous Epithelial / HPF 6-10 0 - 5 /HPF   Mucus PRESENT     Comment: Performed at [facility], [address], [place], [place] [address]  Basic metabolic panel     Status: Abnormal   Collection Time: 04/17/22  2:22 AM  Result Value Ref Range   Sodium 139 135 - 145 mmol/L   Potassium 3.0 (L) 3.5 - 5.1 mmol/L   Chloride 103 98 - 111 mmol/L   CO2 26 22 - 32 mmol/L   Glucose, Bld 106 (H) 70 - 99 mg/dL    Comment: Glucose reference range applies only to samples taken after fasting for at least 8 hours.   BUN 17 8 - 23 mg/dL   Creatinine, Ser 3.23 (H) 0.44 - 1.00 mg/dL   Calcium 9.7 8.9 - 55.7 mg/dL   GFR, Estimated 37 (L) >60 mL/min    Comment: (NOTE) Calculated using the CKD-EPI Creatinine Equation (2021)    Anion gap 10 5 - 15    Comment: Performed at [facility], [address], [place], [place] [address]  CBC     Status: Abnormal   Collection Time: 04/17/22  2:22 AM  Result Value Ref Range  WBC 11.3 (H) 4.0 - 10.5 K/uL   RBC 5.41 (H) 3.87 - 5.11 MIL/uL   Hemoglobin 12.7 12.0 - 15.0 g/dL   HCT 40.4 36.0 - 46.0 %   MCV 74.7 (L) 80.0 - 100.0 fL   MCH 23.5 (L) 26.0 - 34.0 pg   MCHC 31.4 30.0 - 36.0 g/dL   RDW 14.8 11.5 - 15.5 %   Platelets 317 150 - 400 K/uL   nRBC 0.0 0.0 - 0.2 %    Comment: Performed at [facility], [place] [address], [place], [place] [address]   CT ABDOMEN PELVIS W CONTRAST  Result Date: 04/16/2022 CLINICAL DATA:  Abdominal pain, acute, nonlocalized EXAM: CT ABDOMEN AND PELVIS WITH CONTRAST TECHNIQUE: Multidetector CT imaging of the abdomen and pelvis was performed using the standard protocol following bolus administration of intravenous contrast. RADIATION DOSE REDUCTION: This exam was performed according to the departmental dose-optimization program which includes automated exposure control, adjustment of the mA and/or kV according to patient size and/or use of iterative reconstruction technique. CONTRAST:  83mL OMNIPAQUE IOHEXOL 350  MG/ML SOLN COMPARISON:  02/25/2017 FINDINGS: Lower chest: No acute abnormality. Hepatobiliary: No focal liver abnormality is seen. Status post cholecystectomy. No biliary dilatation. Pancreas: Unremarkable Spleen: Unremarkable Adrenals/Urinary Tract: The adrenal glands are unremarkable. The kidneys are normal in size and position. 8 mm nonobstructing calculus noted within the interpolar region of the left kidney. The kidneys are otherwise unremarkable. Bladder is unremarkable. Stomach/Bowel: Gastric lap band device in expected position. Mild descending colonic diverticulosis without superimposed acute inflammatory change. Stomach, small bowel, and large bowel are otherwise unremarkable. Appendix normal. No free intraperitoneal gas or fluid. Vascular/Lymphatic: Moderate aortoiliac atherosclerotic calcification. No aortic aneurysm. No pathologic adenopathy within the abdomen and pelvis. Reproductive: Status post hysterectomy. No adnexal masses. Other: No abdominal wall hernia Musculoskeletal: Degenerative changes seen within the lumbar spine. No acute bone abnormality. IMPRESSION: 1. No acute intra-abdominal pathology identified. No definite radiographic explanation for the patient's reported symptoms. 2. Mild left nonobstructing nephrolithiasis. No urolithiasis. No hydronephrosis. 3. Gastric lap band device in expected position. 4. Mild descending colonic diverticulosis without superimposed acute inflammatory change. Electronically Signed   By: Fidela Salisbury M.D.   On: 04/16/2022 20:58    Pending Labs Unresulted Labs (From admission, onward)     Start     Ordered   04/17/22 2878  Basic metabolic panel  Daily,   R      04/16/22 2341            Vitals/Pain Today's Vitals   04/17/22 0320 04/17/22 0533 04/17/22 0645 04/17/22 0734  BP: (!) 110/58   (!) 162/77  Pulse: 98  81 73  Resp: (!) 28  18 20   Temp: 97.8 F (36.6 C)   98.5 F (36.9 C)  TempSrc: Oral   Oral  SpO2: 94%  98% 94%  Weight:  90.5  kg    Height:  5\' 6"  (1.676 m)    PainSc:        Isolation Precautions No active isolations  Medications Medications  lactated ringers infusion ( Intravenous New Bag/Given 04/17/22 0230)  aspirin chewable tablet 81 mg (has no administration in time range)  atorvastatin (LIPITOR) tablet 10 mg (has no administration in time range)  ALPRAZolam (XANAX) tablet 0.125-0.25 mg (has no administration in time range)  venlafaxine XR (EFFEXOR-XR) 24 hr capsule 75 mg (0 mg Oral Hold 04/17/22 0858)  topiramate (TOPAMAX) tablet 100 mg (has no administration in time range)  heparin injection  5,000 Units (5,000 Units Subcutaneous Not Given 04/17/22 0651)  sodium chloride flush (NS) 0.9 % injection 3 mL (3 mLs Intravenous Given 04/17/22 0050)  acetaminophen (TYLENOL) tablet 650 mg (has no administration in time range)    Or  acetaminophen (TYLENOL) suppository 650 mg (has no administration in time range)  ondansetron (ZOFRAN) tablet 4 mg ( Oral See Alternative 04/17/22 0851)    Or  ondansetron (ZOFRAN) injection 4 mg (4 mg Intravenous Given 04/17/22 0851)  oxyCODONE-acetaminophen (PERCOCET/ROXICET) 5-325 MG per tablet 1 tablet (1 tablet Oral Given 04/17/22 0230)    And  oxyCODONE (Oxy IR/ROXICODONE) immediate release tablet 5 mg (has no administration in time range)  potassium chloride SA (KLOR-CON M) CR tablet 40 mEq (0 mEq Oral Hold 04/17/22 0859)  lactated ringers bolus 1,000 mL (0 mLs Intravenous Stopped 04/16/22 1817)  ondansetron (ZOFRAN) injection 4 mg (4 mg Intravenous Given 04/16/22 1724)  lactated ringers bolus 1,000 mL (0 mLs Intravenous Stopped 04/16/22 1817)  lactated ringers bolus 500 mL (0 mLs Intravenous Stopped 04/16/22 2036)  ondansetron (ZOFRAN) injection 4 mg (4 mg Intravenous Given 04/16/22 2018)  iohexol (OMNIPAQUE) 350 MG/ML injection 75 mL (75 mLs Intravenous Contrast Given 04/16/22 2049)    Mobility walks     Focused Assessments     R Recommendations: See Admitting Provider  Note  Report given to:   Additional Notes:  pt is staff here.

## 2022-04-17 NOTE — ED Notes (Signed)
Pt reports improved symptoms, denies any pain at this time pt given ice water per request

## 2022-04-18 ENCOUNTER — Inpatient Hospital Stay (HOSPITAL_COMMUNITY): Payer: Commercial Managed Care - PPO

## 2022-04-18 DIAGNOSIS — E861 Hypovolemia: Secondary | ICD-10-CM | POA: Diagnosis not present

## 2022-04-18 DIAGNOSIS — R112 Nausea with vomiting, unspecified: Secondary | ICD-10-CM | POA: Diagnosis not present

## 2022-04-18 DIAGNOSIS — I9589 Other hypotension: Secondary | ICD-10-CM | POA: Diagnosis not present

## 2022-04-18 DIAGNOSIS — N179 Acute kidney failure, unspecified: Secondary | ICD-10-CM | POA: Diagnosis not present

## 2022-04-18 LAB — URINALYSIS, COMPLETE (UACMP) WITH MICROSCOPIC
Bilirubin Urine: NEGATIVE
Glucose, UA: NEGATIVE mg/dL
Hgb urine dipstick: NEGATIVE
Ketones, ur: NEGATIVE mg/dL
Nitrite: NEGATIVE
Protein, ur: NEGATIVE mg/dL
Specific Gravity, Urine: 1.006 (ref 1.005–1.030)
pH: 8 (ref 5.0–8.0)

## 2022-04-18 LAB — CBC WITH DIFFERENTIAL/PLATELET
Abs Immature Granulocytes: 0.02 10*3/uL (ref 0.00–0.07)
Basophils Absolute: 0 10*3/uL (ref 0.0–0.1)
Basophils Relative: 0 %
Eosinophils Absolute: 0.1 10*3/uL (ref 0.0–0.5)
Eosinophils Relative: 1 %
HCT: 36.1 % (ref 36.0–46.0)
Hemoglobin: 11.4 g/dL — ABNORMAL LOW (ref 12.0–15.0)
Immature Granulocytes: 0 %
Lymphocytes Relative: 24 %
Lymphs Abs: 2 10*3/uL (ref 0.7–4.0)
MCH: 23.7 pg — ABNORMAL LOW (ref 26.0–34.0)
MCHC: 31.6 g/dL (ref 30.0–36.0)
MCV: 75.1 fL — ABNORMAL LOW (ref 80.0–100.0)
Monocytes Absolute: 0.8 10*3/uL (ref 0.1–1.0)
Monocytes Relative: 10 %
Neutro Abs: 5.4 10*3/uL (ref 1.7–7.7)
Neutrophils Relative %: 65 %
Platelets: 292 10*3/uL (ref 150–400)
RBC: 4.81 MIL/uL (ref 3.87–5.11)
RDW: 14.6 % (ref 11.5–15.5)
WBC: 8.4 10*3/uL (ref 4.0–10.5)
nRBC: 0 % (ref 0.0–0.2)

## 2022-04-18 LAB — BASIC METABOLIC PANEL
Anion gap: 9 (ref 5–15)
BUN: 10 mg/dL (ref 8–23)
CO2: 27 mmol/L (ref 22–32)
Calcium: 9.7 mg/dL (ref 8.9–10.3)
Chloride: 106 mmol/L (ref 98–111)
Creatinine, Ser: 1.13 mg/dL — ABNORMAL HIGH (ref 0.44–1.00)
GFR, Estimated: 55 mL/min — ABNORMAL LOW (ref >60)
Glucose, Bld: 93 mg/dL (ref 70–99)
Potassium: 3.1 mmol/L — ABNORMAL LOW (ref 3.5–5.1)
Sodium: 142 mmol/L (ref 135–145)

## 2022-04-18 LAB — MAGNESIUM: Magnesium: 1.7 mg/dL (ref 1.7–2.4)

## 2022-04-18 MED ORDER — POTASSIUM CHLORIDE CRYS ER 20 MEQ PO TBCR: 20.0000 meq | EXTENDED_RELEASE_TABLET | Freq: Two times a day (BID) | ORAL | 0 refills | Status: DC

## 2022-04-18 NOTE — Discharge Summary (Signed)
Physician Discharge Summary  Samantha Molina NGE:[id number] DOB: 11/10/1960 DOA: 04/16/2022  PCP: Donald Prose, MD  Admit date: 04/16/2022 Discharge date: 04/18/2022  Admitted From: Home Disposition: Home  Recommendations for Outpatient Follow-up:  Follow up with PCP in 1-2 weeks Please obtain BMP/CBC in one week   Home Health: N/A Equipment/Devices: N/A  Discharge Condition: Stable CODE STATUS: Full code Diet recommendation: Low-salt diet.  Hydration.  Discharge summary: 62 year old with history of hypertension, mild obstructive coronary artery disease, chronic pain syndrome, migraines and obesity status post gastric banding who was recently started on Wegovy for weight loss and received first dose last week admitted with 2 days of persistent nausea, mild generalized abdominal pain and discomfort, syncopal episode at work.  No recent antibiotic use.  She also had 4 loose bowel movement before admission but none today.  In the emergency room,  initially blood pressures were stable.  EKG with sinus tachycardia with PACs.  CT scan abdomen pelvis was essentially normal.  Troponins negative.  Creatinine 1.74 with recent normal creatinine.  Patient was found with clinical dehydration.  Received aggressive IV fluids and admitted to the hospital.     Assessment & Plan:   Intractable nausea vomiting: Likely secondary to [facility].  Does not have any infective signs of diarrhea. No more loose stool. Needed ongoing IV fluid and challenge with oral intake so remained in the hospital. Today adequately improved.  Adequate oral intake and fluid intake.   Moderate dehydration with acute kidney injury, hypokalemia Presyncopal episode due to dehydration.  Adequately improved.  Mobilize around.  No evidence of orthostatic symptoms. Renal functions are normalizing. Potassium was replaced, will send further potassium to the pharmacy. Patient was advised to start amlodipine in 2 days but it was advised to  continue to hold lisinopril- hydrochlorothiazide for about a week due to significant dehydration and renal abnormality.  Hopefully can resume after follow-up with primary care physician's office.   Essential hypertension: Blood pressures stabilizing.  Resume amlodipine.  Holding lisinopril hydrochlorothiazide.   Chronic medical issues including Chronic migraine, on Topamax Depression and anxiety, takes venlafaxine. CAD, on aspirin and statin. Chronic pain syndrome, on oxycodone at home that is continued.  Adequately stable today for discharge.    Discharge Diagnoses:  Principal Problem:   Syncope Active Problems:   Mild major depression, single episode (HCC)   Chronic pain   Anxiety   AKI (acute kidney injury) ([place])   Hypotension due to hypovolemia   Migraine without aura   Intractable nausea and vomiting    Discharge Instructions  Discharge Instructions     Diet - low sodium heart healthy   Complete by: As directed    Discharge instructions   Complete by: As directed    Amlodipine you can resume on 1/29 Lisinopril -HCTZ , only resume after seen at follow up visit at PCP   Increase activity slowly   Complete by: As directed       Allergies as of 04/18/2022       Reactions   Isosorbide Mononitrate [isosorbide Nitrate] Other (See Comments)   Headache        Medication List     STOP taking these medications    Wegovy 0.5 MG/0.5ML Soaj Generic drug: Semaglutide-Weight Management       TAKE these medications    acetaminophen 650 MG CR tablet Commonly known as: TYLENOL Take 650 mg by mouth every 8 (eight) hours as needed for pain.   ALPRAZolam 0.25 MG tablet Commonly known  as: Xanax Take 0.5-1 tablets (0.125-0.25 mg total) by mouth daily as needed. What changed: reasons to take this   amLODipine 5 MG tablet Commonly known as: NORVASC Take 1 tablet (5 mg total) by mouth daily. What changed: Another medication with the same name was removed. Continue  taking this medication, and follow the directions you see here.   Aspirin Low Dose 81 MG chewable tablet Generic drug: aspirin Chew 1 tablet (81 mg total) by mouth daily.   atorvastatin 10 MG tablet Commonly known as: LIPITOR Take 1 tablet (10 mg total) by mouth daily.   cephALEXin 250 MG capsule Commonly known as: KEFLEX Take 1 capsule (250 mg total) by mouth daily.   desvenlafaxine 50 MG 24 hr tablet Commonly known as: Pristiq Take 1 tablet (50 mg total) by mouth daily.   lisinopril-hydrochlorothiazide 20-25 MG tablet Commonly known as: ZESTORETIC Take 1 tablet by mouth daily.   multivitamin with minerals tablet Take 1 tablet by mouth daily.   ondansetron 4 MG disintegrating tablet Commonly known as: ZOFRAN-ODT Dissolve 2 tablets (8 mg total) by mouth 2 (two) times daily.   oxybutynin 10 MG 24 hr tablet Commonly known as: DITROPAN-XL Take 1 tablet (10 mg total) by mouth at bedtime.   oxyCODONE-acetaminophen 10-325 MG tablet Commonly known as: Percocet Take 1 tablet by mouth every 8 (eight) hours as needed. What changed: Another medication with the same name was removed. Continue taking this medication, and follow the directions you see here.   potassium chloride SA 20 MEQ tablet Commonly known as: KLOR-CON M Take 1 tablet (20 mEq total) by mouth 2 (two) times daily for 7 days.   topiramate 100 MG tablet Commonly known as: Topamax Take 1 tablet (100 mg total) by mouth 2 (two) times daily.        Allergies  Allergen Reactions   Isosorbide Mononitrate [Isosorbide Nitrate] Other (See Comments)    Headache    Consultations: None   Procedures/Studies: ECHOCARDIOGRAM COMPLETE  Result Date: 04/17/2022    ECHOCARDIOGRAM REPORT   Patient Name:   Samantha Molina [facility] Date of Exam: 04/17/2022 Medical Rec #:  [id number]           Height:       61.0 in Accession #:    [id number]          Weight:       191.6 lb Date of Birth:  08/21/1960           BSA:          1.855 m  Patient Age:    62 years            BP:           131/67 mmHg Patient Gender: F                   HR:           67 bpm. Exam Location:  Inpatient Procedure: 2D Echo, Cardiac Doppler and Color Doppler Indications:    Syncope  History:        Patient has prior history of Echocardiogram examinations, most                 recent 10/28/2021. Risk Factors:Hypertension and Diabetes.  Sonographer:    Ross Ludwig RDCS (AE) Referring Phys: [id number] TIMOTHY S OPYD IMPRESSIONS  1. Left ventricular ejection fraction, by estimation, is 55 to 60%. The left ventricle has normal function. The left ventricle has no regional wall motion  abnormalities. Left ventricular diastolic parameters are consistent with Grade I diastolic dysfunction (impaired relaxation).  2. Right ventricular systolic function is normal. The right ventricular size is normal.  3. No prolapse Leaflets are thickened with mildly restricted posterior leaflet . The mitral valve is abnormal. Trivial mitral valve regurgitation. No evidence of mitral stenosis.  4. The aortic valve is tricuspid. There is mild calcification of the aortic valve. There is mild thickening of the aortic valve. Aortic valve regurgitation is not visualized. Aortic valve sclerosis is present, with no evidence of aortic valve stenosis.  5. The inferior vena cava is normal in size with greater than 50% respiratory variability, suggesting right atrial pressure of 3 mmHg. FINDINGS  Left Ventricle: Left ventricular ejection fraction, by estimation, is 55 to 60%. The left ventricle has normal function. The left ventricle has no regional wall motion abnormalities. The left ventricular internal cavity size was normal in size. There is  no left ventricular hypertrophy. Left ventricular diastolic parameters are consistent with Grade I diastolic dysfunction (impaired relaxation). Right Ventricle: The right ventricular size is normal. No increase in right ventricular wall thickness. Right ventricular systolic  function is normal. Left Atrium: Left atrial size was normal in size. Right Atrium: Right atrial size was normal in size. Pericardium: There is no evidence of pericardial effusion. Mitral Valve: No prolapse Leaflets are thickened with mildly restricted posterior leaflet. The mitral valve is abnormal. There is moderate thickening of the mitral valve leaflet(s). There is mild calcification of the mitral valve leaflet(s). Trivial mitral valve regurgitation. No evidence of mitral valve stenosis. MV peak gradient, 6.0 mmHg. The mean mitral valve gradient is 2.0 mmHg. Tricuspid Valve: The tricuspid valve is normal in structure. Tricuspid valve regurgitation is mild . No evidence of tricuspid stenosis. Aortic Valve: The aortic valve is tricuspid. There is mild calcification of the aortic valve. There is mild thickening of the aortic valve. Aortic valve regurgitation is not visualized. Aortic valve sclerosis is present, with no evidence of aortic valve stenosis. Aortic valve mean gradient measures 7.0 mmHg. Aortic valve peak gradient measures 13.1 mmHg. Aortic valve area, by VTI measures 1.74 cm. Pulmonic Valve: The pulmonic valve was normal in structure. Pulmonic valve regurgitation is not visualized. No evidence of pulmonic stenosis. Aorta: The aortic root is normal in size and structure. Venous: The inferior vena cava is normal in size with greater than 50% respiratory variability, suggesting right atrial pressure of 3 mmHg. IAS/Shunts: No atrial level shunt detected by color flow Doppler.  LEFT VENTRICLE PLAX 2D LVIDd:         4.50 cm   Diastology LVIDs:         3.10 cm   LV e' medial:    8.16 cm/s LV PW:         1.00 cm   LV E/e' medial:  9.9 LV IVS:        1.10 cm   LV e' lateral:   15.80 cm/s LVOT diam:     1.90 cm   LV E/e' lateral: 5.1 LV SV:         65 LV SV Index:   35 LVOT Area:     2.84 cm  RIGHT VENTRICLE             IVC RV Basal diam:  2.60 cm     IVC diam: 2.10 cm RV S prime:     10.60 cm/s TAPSE (M-mode):  2.0 cm LEFT ATRIUM  Index        RIGHT ATRIUM           Index LA diam:        3.70 cm 2.00 cm/m   RA Area:     12.50 cm LA Vol (A2C):   29.6 ml 15.96 ml/m  RA Volume:   23.90 ml  12.89 ml/m LA Vol (A4C):   24.5 ml 13.21 ml/m LA Biplane Vol: 28.2 ml 15.21 ml/m  AORTIC VALVE AV Area (Vmax):    1.72 cm AV Area (Vmean):   1.69 cm AV Area (VTI):     1.74 cm AV Vmax:           181.00 cm/s AV Vmean:          121.000 cm/s AV VTI:            0.374 m AV Peak Grad:      13.1 mmHg AV Mean Grad:      7.0 mmHg LVOT Vmax:         110.00 cm/s LVOT Vmean:        72.100 cm/s LVOT VTI:          0.230 m LVOT/AV VTI ratio: 0.61  AORTA Ao Root diam: 3.00 cm MITRAL VALVE MV Area (PHT): 2.55 cm     SHUNTS MV Area VTI:   2.01 cm     Systemic VTI:  0.23 m MV Peak grad:  6.0 mmHg     Systemic Diam: 1.90 cm MV Mean grad:  2.0 mmHg MV Vmax:       1.22 m/s MV Vmean:      61.3 cm/s MV Decel Time: 297 msec MV E velocity: 80.90 cm/s MV A velocity: 114.00 cm/s MV E/A ratio:  0.71 Jenkins Rouge MD Electronically signed by Jenkins Rouge MD Signature Date/Time: 04/17/2022/4:11:39 PM    Final    CT ABDOMEN PELVIS W CONTRAST  Result Date: 04/16/2022 CLINICAL DATA:  Abdominal pain, acute, nonlocalized EXAM: CT ABDOMEN AND PELVIS WITH CONTRAST TECHNIQUE: Multidetector CT imaging of the abdomen and pelvis was performed using the standard protocol following bolus administration of intravenous contrast. RADIATION DOSE REDUCTION: This exam was performed according to the departmental dose-optimization program which includes automated exposure control, adjustment of the mA and/or kV according to patient size and/or use of iterative reconstruction technique. CONTRAST:  71mL OMNIPAQUE IOHEXOL 350 MG/ML SOLN COMPARISON:  02/25/2017 FINDINGS: Lower chest: No acute abnormality. Hepatobiliary: No focal liver abnormality is seen. Status post cholecystectomy. No biliary dilatation. Pancreas: Unremarkable Spleen: Unremarkable Adrenals/Urinary  Tract: The adrenal glands are unremarkable. The kidneys are normal in size and position. 8 mm nonobstructing calculus noted within the interpolar region of the left kidney. The kidneys are otherwise unremarkable. Bladder is unremarkable. Stomach/Bowel: Gastric lap band device in expected position. Mild descending colonic diverticulosis without superimposed acute inflammatory change. Stomach, small bowel, and large bowel are otherwise unremarkable. Appendix normal. No free intraperitoneal gas or fluid. Vascular/Lymphatic: Moderate aortoiliac atherosclerotic calcification. No aortic aneurysm. No pathologic adenopathy within the abdomen and pelvis. Reproductive: Status post hysterectomy. No adnexal masses. Other: No abdominal wall hernia Musculoskeletal: Degenerative changes seen within the lumbar spine. No acute bone abnormality. IMPRESSION: 1. No acute intra-abdominal pathology identified. No definite radiographic explanation for the patient's reported symptoms. 2. Mild left nonobstructing nephrolithiasis. No urolithiasis. No hydronephrosis. 3. Gastric lap band device in expected position. 4. Mild descending colonic diverticulosis without superimposed acute inflammatory change. Electronically Signed   By: Fidela Salisbury M.D.   On:  04/16/2022 20:58   (Echo, Carotid, EGD, Colonoscopy, ERCP)    Subjective: Patient seen and examined.  Denies any complaints.  Eager to go home.  Able to eat regular diet.  She did use 1 dose of prophylactic Zofran yesterday evening before dinner but patient tells me that she was not nauseated either.   Discharge Exam: Vitals:   04/18/22 0530 04/18/22 0735  BP: (!) 113/59 120/63  Pulse: 70 84  Resp: 18 18  Temp: 99.6 F (37.6 C) 98.6 F (37 C)  SpO2: 92% 94%   Vitals:   04/17/22 2100 04/17/22 2317 04/18/22 0530 04/18/22 0735  BP:  119/64 (!) 113/59 120/63  Pulse:  85 70 84  Resp:  16 18 18   Temp:  (!) 100.7 F (38.2 C) 99.6 F (37.6 C) 98.6 F (37 C)  TempSrc:   Oral Oral Oral  SpO2: 96% 93% 92% 94%  Weight:   87.3 kg   Height:        General: Pt is alert, awake, not in acute distress Cardiovascular: RRR, S1/S2 +, no rubs, no gallops Respiratory: CTA bilaterally, no wheezing, no rhonchi Abdominal: Soft, NT, ND, bowel sounds + Extremities: no edema, no cyanosis    The results of significant diagnostics from this hospitalization (including imaging, microbiology, ancillary and laboratory) are listed below for reference.     Microbiology: No results found for this or any previous visit (from the past 240 hour(s)).   Labs: BNP (last 3 results) No results for input(s): "BNP" in the last 8760 hours. Basic Metabolic Panel: Recent Labs  Lab 04/16/22 1708 04/16/22 1807 04/17/22 0222 04/18/22 0108  NA 136 139 139 142  K 3.7 3.8 3.0* 3.1*  CL 100 104 103 106  CO2 21*  --  26 27  GLUCOSE 158* 153* 106* 93  BUN 18 21 17 10   CREATININE 1.74* 1.80* 1.58* 1.13*  CALCIUM 10.8*  --  9.7 9.7  MG 2.3  --   --  1.7  PHOS 2.7  --   --   --    Liver Function Tests: Recent Labs  Lab 04/16/22 1708  AST 34  ALT 19  ALKPHOS 49  BILITOT 0.8  PROT 7.7  ALBUMIN 4.2   No results for input(s): "LIPASE", "AMYLASE" in the last 168 hours. No results for input(s): "AMMONIA" in the last 168 hours. CBC: Recent Labs  Lab 04/16/22 1708 04/16/22 1807 04/17/22 0222 04/18/22 0108  WBC 5.3  --  11.3* 8.4  NEUTROABS 1.8  --   --  5.4  HGB 14.3 15.6* 12.7 11.4*  HCT 45.0 46.0 40.4 36.1  MCV 74.1*  --  74.7* 75.1*  PLT 396  --  317 292   Cardiac Enzymes: No results for input(s): "CKTOTAL", "CKMB", "CKMBINDEX", "TROPONINI" in the last 168 hours. BNP: Invalid input(s): "POCBNP" CBG: Recent Labs  Lab 04/16/22 1658  GLUCAP 168*   D-Dimer No results for input(s): "DDIMER" in the last 72 hours. Hgb A1c No results for input(s): "HGBA1C" in the last 72 hours. Lipid Profile No results for input(s): "CHOL", "HDL", "LDLCALC", "TRIG", "CHOLHDL",  "LDLDIRECT" in the last 72 hours. Thyroid function studies No results for input(s): "TSH", "T4TOTAL", "T3FREE", "THYROIDAB" in the last 72 hours.  Invalid input(s): "FREET3" Anemia work up No results for input(s): "VITAMINB12", "FOLATE", "FERRITIN", "TIBC", "IRON", "RETICCTPCT" in the last 72 hours. Urinalysis    Component Value Date/Time   COLORURINE STRAW (A) 04/18/2022 0149   APPEARANCEUR CLEAR 04/18/2022 0149   LABSPEC 1.006  04/18/2022 0149   PHURINE 8.0 04/18/2022 0149   GLUCOSEU NEGATIVE 04/18/2022 0149   HGBUR NEGATIVE 04/18/2022 0149   BILIRUBINUR NEGATIVE 04/18/2022 0149   KETONESUR NEGATIVE 04/18/2022 0149   PROTEINUR NEGATIVE 04/18/2022 0149   UROBILINOGEN 1.0 01/18/2010 1310   NITRITE NEGATIVE 04/18/2022 0149   LEUKOCYTESUR TRACE (A) 04/18/2022 0149   Sepsis Labs Recent Labs  Lab 04/16/22 1708 04/17/22 0222 04/18/22 0108  WBC 5.3 11.3* 8.4   Microbiology No results found for this or any previous visit (from the past 240 hour(s)).   Time coordinating discharge: 32 minutes  SIGNED:   Dorcas Carrow, MD  [facility] 04/18/2022, 8:11 AM

## 2022-04-18 NOTE — Plan of Care (Signed)

## 2022-04-18 NOTE — Progress Notes (Signed)
[facility] night cross cover note:   I was notified by RN that this patient is exhibiting a newly increased temperature of 100.7, presenting a temperature max for this hospital course.  No additional SIRS criteria met at this time, noting heart rates in the 80s, respiratory rate 16, and most recent CBC reflecting white blood cell count that does not meet the 12,000 threshold for SIRS criteria.  Normotensive blood pressure.   As this elevated temperature appears new for this Hospital course , will initiate an infectious workup in the form of checking urinalysis, chest x-ray, blood cultures.  Refraining from initiation of any of antibiotics at this time, pending results of additional infectious workup, as above.    Babs Bertin, DO Hospitalist

## 2022-04-22 ENCOUNTER — Other Ambulatory Visit (HOSPITAL_COMMUNITY): Payer: Self-pay

## 2022-04-23 LAB — CULTURE, BLOOD (ROUTINE X 2)
Culture: NO GROWTH
Culture: NO GROWTH
Special Requests: ADEQUATE
Special Requests: ADEQUATE

## 2022-04-29 ENCOUNTER — Other Ambulatory Visit: Payer: Self-pay | Admitting: Family Medicine

## 2022-04-29 DIAGNOSIS — R110 Nausea: Secondary | ICD-10-CM | POA: Diagnosis not present

## 2022-04-29 DIAGNOSIS — Z6830 Body mass index (BMI) 30.0-30.9, adult: Secondary | ICD-10-CM | POA: Diagnosis not present

## 2022-04-29 DIAGNOSIS — E1169 Type 2 diabetes mellitus with other specified complication: Secondary | ICD-10-CM | POA: Diagnosis not present

## 2022-04-29 DIAGNOSIS — I10 Essential (primary) hypertension: Secondary | ICD-10-CM | POA: Diagnosis not present

## 2022-04-29 DIAGNOSIS — E669 Obesity, unspecified: Secondary | ICD-10-CM | POA: Diagnosis not present

## 2022-04-29 DIAGNOSIS — Z1231 Encounter for screening mammogram for malignant neoplasm of breast: Secondary | ICD-10-CM

## 2022-04-29 DIAGNOSIS — Z9884 Bariatric surgery status: Secondary | ICD-10-CM | POA: Diagnosis not present

## 2022-04-29 DIAGNOSIS — N179 Acute kidney failure, unspecified: Secondary | ICD-10-CM | POA: Diagnosis not present

## 2022-04-30 DIAGNOSIS — Z96652 Presence of left artificial knee joint: Secondary | ICD-10-CM | POA: Diagnosis not present

## 2022-05-02 ENCOUNTER — Other Ambulatory Visit (HOSPITAL_COMMUNITY): Payer: Self-pay

## 2022-05-04 ENCOUNTER — Other Ambulatory Visit: Payer: Self-pay

## 2022-05-06 ENCOUNTER — Other Ambulatory Visit (HOSPITAL_COMMUNITY): Payer: Self-pay

## 2022-05-06 MED ORDER — OXYCODONE-ACETAMINOPHEN 10-325 MG PO TABS
1.0000 | ORAL_TABLET | Freq: Three times a day (TID) | ORAL | 0 refills | Status: DC | PRN
  Filled 2022-05-06: qty 90, 30d supply, fill #0

## 2022-05-06 MED ORDER — ATORVASTATIN CALCIUM 10 MG PO TABS
10.0000 mg | ORAL_TABLET | Freq: Every day | ORAL | 0 refills | Status: AC
  Filled 2022-05-06: qty 90, 90d supply, fill #0

## 2022-05-06 MED ORDER — OMRON 3 SERIES BP MONITOR DEVI
0 refills | Status: AC
  Filled 2022-05-06: qty 1, 1d supply, fill #0

## 2022-05-11 DIAGNOSIS — F411 Generalized anxiety disorder: Secondary | ICD-10-CM | POA: Diagnosis not present

## 2022-05-11 DIAGNOSIS — I700 Atherosclerosis of aorta: Secondary | ICD-10-CM | POA: Diagnosis not present

## 2022-05-11 DIAGNOSIS — F330 Major depressive disorder, recurrent, mild: Secondary | ICD-10-CM | POA: Diagnosis not present

## 2022-05-11 DIAGNOSIS — I10 Essential (primary) hypertension: Secondary | ICD-10-CM | POA: Diagnosis not present

## 2022-05-11 DIAGNOSIS — E1169 Type 2 diabetes mellitus with other specified complication: Secondary | ICD-10-CM | POA: Diagnosis not present

## 2022-05-13 ENCOUNTER — Ambulatory Visit (INDEPENDENT_AMBULATORY_CARE_PROVIDER_SITE_OTHER): Payer: Commercial Managed Care - PPO | Admitting: Podiatry

## 2022-05-13 ENCOUNTER — Encounter: Payer: Self-pay | Admitting: Podiatry

## 2022-05-13 DIAGNOSIS — M779 Enthesopathy, unspecified: Secondary | ICD-10-CM

## 2022-05-13 DIAGNOSIS — M2170 Unequal limb length (acquired), unspecified site: Secondary | ICD-10-CM | POA: Diagnosis not present

## 2022-05-13 DIAGNOSIS — L600 Ingrowing nail: Secondary | ICD-10-CM | POA: Diagnosis not present

## 2022-05-13 NOTE — Progress Notes (Signed)
Subjective:   Patient ID: Samantha Molina, female   DOB: 62 y.o.   MRN: [id number]   HPI Patient presents with an abnormal thickness of the right big toenail that has been getting more sore and knows she needs it removed and also she does get foot pain in general and has a limb length discrepancy with a short right leg   ROS      Objective:  Physical Exam  Thick yellow brittle nail bed right big toenail sore when pressed moderately loose and structural changes of both feet with patient who needs new orthotics as he she cannot transfer the last pair and she needs to be able to wear them in all shoes with leg length discrepancy     Assessment:  Damaged right hallux toenail that is thick and moderately painful along with limb length discrepancy tendinitis     Plan:  H&P reviewed both conditions and I do recommend removal of the nail educated her on this and she will reappoint for permanent procedure.  We did go ahead today casted for new functional orthotics and we will add lift right in order to support the plantar arch

## 2022-05-19 ENCOUNTER — Other Ambulatory Visit (HOSPITAL_COMMUNITY): Payer: Self-pay

## 2022-05-19 DIAGNOSIS — G43009 Migraine without aura, not intractable, without status migrainosus: Secondary | ICD-10-CM | POA: Diagnosis not present

## 2022-05-19 DIAGNOSIS — M5459 Other low back pain: Secondary | ICD-10-CM | POA: Diagnosis not present

## 2022-05-19 DIAGNOSIS — M542 Cervicalgia: Secondary | ICD-10-CM | POA: Diagnosis not present

## 2022-05-22 ENCOUNTER — Ambulatory Visit: Admitting: Psychology

## 2022-06-04 ENCOUNTER — Other Ambulatory Visit (HOSPITAL_COMMUNITY): Payer: Self-pay

## 2022-06-04 DIAGNOSIS — E669 Obesity, unspecified: Secondary | ICD-10-CM | POA: Diagnosis not present

## 2022-06-04 DIAGNOSIS — Z6830 Body mass index (BMI) 30.0-30.9, adult: Secondary | ICD-10-CM | POA: Diagnosis not present

## 2022-06-04 DIAGNOSIS — Z713 Dietary counseling and surveillance: Secondary | ICD-10-CM | POA: Diagnosis not present

## 2022-06-04 MED ORDER — OXYCODONE-ACETAMINOPHEN 10-325 MG PO TABS
1.0000 | ORAL_TABLET | Freq: Three times a day (TID) | ORAL | 0 refills | Status: DC | PRN
  Filled 2022-06-04: qty 90, 30d supply, fill #0

## 2022-06-12 ENCOUNTER — Other Ambulatory Visit (HOSPITAL_COMMUNITY): Payer: Self-pay

## 2022-06-12 ENCOUNTER — Other Ambulatory Visit: Payer: Self-pay

## 2022-06-12 ENCOUNTER — Telehealth (HOSPITAL_BASED_OUTPATIENT_CLINIC_OR_DEPARTMENT_OTHER): Payer: Self-pay | Admitting: Family

## 2022-06-12 NOTE — Telephone Encounter (Signed)
Left message for patient to call per 06/10/22 staff message from Laurann Montana, NP   It was just monitoring of the mitral valve which actually looked better on echo 03/2022 so no need for repeat echo. No need to reschedule - we can discuss timing of it when she sees Dr. Harrell Gave in August. I'll [place] just so we can get her one year OV scheduled. Appreciate you looking!   [employer]

## 2022-06-15 ENCOUNTER — Other Ambulatory Visit: Payer: Self-pay

## 2022-06-15 ENCOUNTER — Other Ambulatory Visit (HOSPITAL_COMMUNITY): Payer: Self-pay

## 2022-06-17 ENCOUNTER — Other Ambulatory Visit (HOSPITAL_COMMUNITY): Payer: Self-pay

## 2022-06-17 DIAGNOSIS — M5412 Radiculopathy, cervical region: Secondary | ICD-10-CM | POA: Diagnosis not present

## 2022-06-17 DIAGNOSIS — G603 Idiopathic progressive neuropathy: Secondary | ICD-10-CM | POA: Diagnosis not present

## 2022-06-17 DIAGNOSIS — M5417 Radiculopathy, lumbosacral region: Secondary | ICD-10-CM | POA: Diagnosis not present

## 2022-06-17 MED ORDER — DESVENLAFAXINE SUCCINATE ER 50 MG PO TB24
50.0000 mg | ORAL_TABLET | Freq: Every day | ORAL | 1 refills | Status: AC
  Filled 2022-06-17 (×2): qty 90, 90d supply, fill #0

## 2022-06-17 MED ORDER — LISINOPRIL-HYDROCHLOROTHIAZIDE 20-25 MG PO TABS
1.0000 | ORAL_TABLET | Freq: Every day | ORAL | 1 refills | Status: AC
  Filled 2022-06-17: qty 90, 90d supply, fill #0

## 2022-06-18 ENCOUNTER — Telehealth: Payer: Self-pay | Admitting: Podiatry

## 2022-06-18 ENCOUNTER — Other Ambulatory Visit (HOSPITAL_COMMUNITY): Payer: Self-pay

## 2022-06-18 NOTE — Telephone Encounter (Signed)
Lmom for patient to call back to pick up 2nd pair of orthotics   Balance is 490.00

## 2022-06-30 ENCOUNTER — Ambulatory Visit: Payer: Commercial Managed Care - PPO

## 2022-07-02 ENCOUNTER — Other Ambulatory Visit (HOSPITAL_COMMUNITY): Payer: Self-pay

## 2022-07-02 MED ORDER — OXYCODONE-ACETAMINOPHEN 10-325 MG PO TABS
1.0000 | ORAL_TABLET | Freq: Three times a day (TID) | ORAL | 0 refills | Status: AC | PRN
  Filled 2022-07-02: qty 90, 30d supply, fill #0

## 2022-07-06 ENCOUNTER — Other Ambulatory Visit: Payer: Commercial Managed Care - PPO

## 2022-07-11 ENCOUNTER — Other Ambulatory Visit (HOSPITAL_COMMUNITY): Payer: Self-pay

## 2022-07-13 ENCOUNTER — Other Ambulatory Visit (HOSPITAL_COMMUNITY): Payer: Self-pay

## 2022-07-13 MED ORDER — TOPIRAMATE 100 MG PO TABS
100.0000 mg | ORAL_TABLET | Freq: Two times a day (BID) | ORAL | 5 refills | Status: AC
  Filled 2022-07-13: qty 60, 30d supply, fill #0

## 2022-07-16 ENCOUNTER — Ambulatory Visit: Payer: Commercial Managed Care - PPO | Admitting: Podiatry

## 2022-07-17 ENCOUNTER — Ambulatory Visit: Admitting: Psychology

## 2022-07-17 ENCOUNTER — Ambulatory Visit: Payer: Commercial Managed Care - PPO | Admitting: Podiatry

## 2022-07-20 ENCOUNTER — Other Ambulatory Visit (HOSPITAL_COMMUNITY): Payer: Self-pay

## 2022-07-21 DIAGNOSIS — F419 Anxiety disorder, unspecified: Secondary | ICD-10-CM | POA: Diagnosis not present

## 2022-07-21 DIAGNOSIS — M7918 Myalgia, other site: Secondary | ICD-10-CM | POA: Diagnosis not present

## 2022-07-21 DIAGNOSIS — G894 Chronic pain syndrome: Secondary | ICD-10-CM | POA: Diagnosis not present

## 2022-07-21 DIAGNOSIS — Z79899 Other long term (current) drug therapy: Secondary | ICD-10-CM | POA: Diagnosis not present

## 2022-07-21 DIAGNOSIS — M7912 Myalgia of auxiliary muscles, head and neck: Secondary | ICD-10-CM | POA: Diagnosis not present

## 2022-07-21 DIAGNOSIS — G43709 Chronic migraine without aura, not intractable, without status migrainosus: Secondary | ICD-10-CM | POA: Diagnosis not present

## 2022-07-27 ENCOUNTER — Ambulatory Visit: Admitting: Podiatry

## 2022-07-29 ENCOUNTER — Encounter: Payer: Self-pay | Admitting: Podiatry

## 2022-07-29 ENCOUNTER — Ambulatory Visit: Admitting: Podiatry

## 2022-07-29 ENCOUNTER — Ambulatory Visit (INDEPENDENT_AMBULATORY_CARE_PROVIDER_SITE_OTHER): Payer: Commercial Managed Care - PPO

## 2022-07-29 DIAGNOSIS — L600 Ingrowing nail: Secondary | ICD-10-CM

## 2022-07-29 DIAGNOSIS — M779 Enthesopathy, unspecified: Secondary | ICD-10-CM

## 2022-07-29 NOTE — Progress Notes (Signed)
Patient presents today to pick up custom molded foot orthotics recommended by Dr. Charlsie Merles.   Orthotics were dispensed.  Patient will follow up as needed.

## 2022-07-29 NOTE — Patient Instructions (Signed)

## 2022-07-29 NOTE — Progress Notes (Signed)
Subjective:   Patient ID: Samantha Molina, female   DOB: 62 y.o.   MRN: [id number]   HPI Patient presents with painful thickened right big toenail that she has not been able to take care of it is gradually becoming more sore and she like it removed permanently   ROS      Objective:  Physical Exam  Neurovascular status intact with patient found to have a damaged right big toenail that has a false nail on that now it is always loose always giving her problems     Assessment:  Chronic trauma to the right big toenail with pain after     Plan:  H&P reviewed recommended correction patient wants surgery and at this point I allowed her to read consent form for removal of the nail.  I allowed her to then signed consent form understanding risk I infiltrated the right big toe 60 mg like Marcaine mixture sterile prep done using sterile instrumentation remove the hallux nail exposed matrix applied phenol for applications 30 seconds followed by alcohol lavage sterile dressing gave instructions on soaks and wear dressing 24 hours take it off earlier if throbbing were to occur with all questions answered today

## 2022-08-07 ENCOUNTER — Ambulatory Visit
Admission: RE | Admit: 2022-08-07 | Discharge: 2022-08-07 | Disposition: A | Discharge status: Home / Self Care | Source: Ambulatory Visit | Attending: Family Medicine | Admitting: Family Medicine

## 2022-08-07 DIAGNOSIS — Z1231 Encounter for screening mammogram for malignant neoplasm of breast: Secondary | ICD-10-CM

## 2022-08-12 ENCOUNTER — Encounter: Payer: Self-pay | Admitting: Podiatry

## 2022-08-12 ENCOUNTER — Ambulatory Visit: Admitting: Podiatry

## 2022-08-12 DIAGNOSIS — L03032 Cellulitis of left toe: Secondary | ICD-10-CM

## 2022-08-12 NOTE — Progress Notes (Signed)
Subjective:   Patient ID: Samantha Molina, female   DOB: 62 y.o.   MRN: [id number]   HPI Patient presents with drainage and discomfort of the right big toenail stating that the nailbed is sore.  States that she is worried about infection neurovascular   ROS      Objective:  Physical Exam  Status intact with patient found to have drainage of a localized serous nature dorsum right hallux nail bed no proximal edema edema drainage noted currently with distal healing occurring      Assessment:  Low-grade paronychia of the right hallux localized     Plan:  Reviewed with patient recommended continued soaks Neosporin usage and if it were to get more red or become painful will have to consider antibiotic but not necessary at the current time and should not be necessary for future

## 2022-08-21 ENCOUNTER — Telehealth (HOSPITAL_BASED_OUTPATIENT_CLINIC_OR_DEPARTMENT_OTHER): Payer: Self-pay | Admitting: Family

## 2022-08-21 NOTE — Telephone Encounter (Signed)
Left message informing patient the echocardiogram on 09/10/22 has been cancelled--le for 6/20. If I remember I think Luther Parody said it was OK to cancel since she had 2 normal echoes after her appointment...requested return confirmation call kf

## 2022-09-10 ENCOUNTER — Other Ambulatory Visit (HOSPITAL_BASED_OUTPATIENT_CLINIC_OR_DEPARTMENT_OTHER): Payer: 59

## 2022-11-05 ENCOUNTER — Ambulatory Visit (HOSPITAL_BASED_OUTPATIENT_CLINIC_OR_DEPARTMENT_OTHER): Payer: Commercial Managed Care - PPO | Admitting: Cardiology

## 2023-01-05 ENCOUNTER — Other Ambulatory Visit: Payer: Self-pay | Admitting: *Deleted

## 2023-01-05 DIAGNOSIS — Z13820 Encounter for screening for osteoporosis: Secondary | ICD-10-CM

## 2023-01-11 ENCOUNTER — Other Ambulatory Visit: Payer: Self-pay | Admitting: *Deleted

## 2023-01-11 ENCOUNTER — Ambulatory Visit (INDEPENDENT_AMBULATORY_CARE_PROVIDER_SITE_OTHER): Admitting: Psychology

## 2023-01-11 DIAGNOSIS — Z13820 Encounter for screening for osteoporosis: Secondary | ICD-10-CM

## 2023-01-11 DIAGNOSIS — F4323 Adjustment disorder with mixed anxiety and depressed mood: Secondary | ICD-10-CM | POA: Diagnosis not present

## 2023-01-11 DIAGNOSIS — Z1231 Encounter for screening mammogram for malignant neoplasm of breast: Secondary | ICD-10-CM
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
## 2023-01-15 ENCOUNTER — Ambulatory Visit (HOSPITAL_BASED_OUTPATIENT_CLINIC_OR_DEPARTMENT_OTHER): Admitting: Cardiology

## 2023-01-15 ENCOUNTER — Encounter (HOSPITAL_BASED_OUTPATIENT_CLINIC_OR_DEPARTMENT_OTHER): Payer: Self-pay | Admitting: Cardiology

## 2023-01-15 VITALS — BP 110/68 | HR 80 | Ht 64.5 in | Wt 174.0 lb

## 2023-01-15 DIAGNOSIS — R0789 Other chest pain: Secondary | ICD-10-CM | POA: Diagnosis not present

## 2023-01-15 DIAGNOSIS — E785 Hyperlipidemia, unspecified: Secondary | ICD-10-CM | POA: Diagnosis not present

## 2023-01-15 DIAGNOSIS — I10 Essential (primary) hypertension: Secondary | ICD-10-CM

## 2023-01-15 DIAGNOSIS — R002 Palpitations: Secondary | ICD-10-CM

## 2023-01-15 DIAGNOSIS — I2510 Atherosclerotic heart disease of native coronary artery without angina pectoris: Secondary | ICD-10-CM | POA: Diagnosis not present

## 2023-01-15 NOTE — Progress Notes (Signed)
Cardiology Office Note:  .    Date:  01/15/2023  ID:  Samantha Molina, DOB 1960/04/01, MRN [id number] PCP: Deatra James, MD  [facility] Providers Cardiologist:  Jodelle Red, MD     History of Present Illness: .    Samantha Molina is a 62 y.o. female with a hx of hypertension, depression/anxiety who is seen for follow up. I initially met her 01/09/19 as a new consult at the request of Deatra James, MD for the evaluation and management of chest pain.   Chest pain history: had full workup and was told everything was fine. Happened when she was living in [place], was 300 lbs at the time. Had lap-band done in 2007, lost a huge amount weight. Has been vigilant about diet and exercise. Had been fine with no further chest pain until most recent episode and then recurrent episode. Occurred 10/28/18, sudden onset around 1130 PM. Aching, substernal, associated with shortness of breath and right jaw pain. Seen in ER 10/29/18 when pain had been constant for several hours. D dimer elevated but CTPE negative. hsTn 3 -> 3. ECG nonischemic. CT cardiac with mild nonobstructive disease 02/2019.   In 07/2020, we reviewed the episode that brought her into the ER. Scariest symptom was that she could not catch her breath. Also had odd sensation in her right arm at the time, midsternal chest pain. Workup unrevealing. Was having a stressful time with family issues. We reviewed her prior workup at length. She was encouraged to keep working on re-intensifying diet and exercise.   She followed up with Gillian Shields, NP on 09/09/2021 where they reviewed her cardiac testing. She continued to have a "twinge" in her right chest. Also complained of headache while taking Imdur since discharge, so this was discontinued. She had undergone knee surgery and was given clearance to go back to exercise such as the stairmaster. Started on Toprol 25mg  QD for optimization of GDMT.   At her visit 10/2021, I had recently  seen her during her hospitalization, see note. We had discussed her chest discomfort and her reassuring cardiac evaluation. She reported feeling better without any recurring symptoms since being discharged. She continued to participate in PREP, and was drinking more water and Gatorade. She planned to obtain a blood pressure cuff for at home monitoring. Also planned to retire soon.  On 04/16/2022 she was admitted to the hospital for syncope in the setting of 2 days of persistent nausea and mild abdominal pain. She had been started on Wegovy the week prior. EKG showed sinus tachycardia with PACs. Troponins negative. She was found with clinical dehydration and received aggressive IV fluids.  Today, she states she is feeling fine overall. No recurring syncopal episodes. No significant palpitations.   At night time she complains of intermittent indigestion/chest discomfort, usually associated with some burning sensations and the urge to belch.   She is now retired and continues to stay active and exercising. Participates in [employer] exercises. She will graduate from college in May.  She denies any shortness of breath, peripheral edema, lightheadedness, headaches, orthopnea, or PND.  ROS:  Please see the history of present illness. ROS otherwise negative except as noted.  (+) Indigestion/chest discomfort  Studies Reviewed: .         Echo  04/19/2022:  1. Left ventricular ejection fraction, by estimation, is 55 to 60%. The  left ventricle has normal function. The left ventricle has no regional  wall motion abnormalities. Left ventricular diastolic parameters are  consistent with Grade I diastolic dysfunction (impaired relaxation).   2. Right ventricular systolic function is normal. The right ventricular  size is normal.   3. No prolapse Leaflets are thickened with mildly restricted posterior  leaflet . The mitral valve is abnormal. Trivial mitral valve  regurgitation. No evidence of mitral  stenosis.   4. The aortic valve is tricuspid. There is mild calcification of the  aortic valve. There is mild thickening of the aortic valve. Aortic valve  regurgitation is not visualized. Aortic valve sclerosis is present, with  no evidence of aortic valve stenosis.   5. The inferior vena cava is normal in size with greater than 50%  respiratory variability, suggesting right atrial pressure of 3 mmHg.   Physical Exam:    VS:  BP 110/68   Pulse 80   Ht 5' 4.5" (1.638 m)   Wt 174 lb (78.9 kg)   BMI 29.41 kg/m    Wt Readings from Last 3 Encounters:  01/15/23 174 lb (78.9 kg)  04/18/22 192 lb 8 oz (87.3 kg)  01/07/22 199 lb 9.6 oz (90.5 kg)    GEN: Well nourished, well developed in no acute distress HEENT: Normal, moist mucous membranes NECK: No JVD CARDIAC: regular rhythm, normal S1 and S2, no rubs or gallops. 1/6 systolic murmur. VASCULAR: Radial and DP pulses 2+ bilaterally. No carotid bruits RESPIRATORY:  Clear to auscultation without rales, wheezing or rhonchi  ABDOMEN: Soft, non-tender, non-distended MUSCULOSKELETAL:  Ambulates independently SKIN: Warm and dry, no edema NEUROLOGIC:  Alert and oriented x 3. No focal neuro deficits noted. PSYCHIATRIC:  Normal affect   ASSESSMENT AND PLAN: .    Chest pain, noncardiac: -reviewed her prior hospital evaluation/CV workup -high suspicion for GI etiology given pattern -discussed red flag warning signs that need immediate medical attention   Nonobstructive CAD Family history of heart disease: -continue atorvastatin, aspirin   Palpitations, possible VSD -monitor with 4% PVC burden, symptoms associate with PVCs -She would like to avoid medications unless symptoms become more bothersome -echo largely normal, no VSD flow seen   Hypertension: Goal <130/80, at goal today Continue amlodipine, lisinopril-HCTZ Working on lifestyle   Prior obesity, now s/p lap band  -working on diet and exercise   Cardiac risk counseling and  prevention recommendations: -recommend heart healthy/Mediterranean diet, with whole grains, fruits, vegetable, fish, lean meats, nuts, and olive oil. Limit salt. -recommend moderate walking, 3-5 times/week for 30-50 minutes each session. Aim for at least 150 minutes.week. Goal should be pace of 3 miles/hours, or walking 1.5 miles in 30 minutes -recommend avoidance of tobacco products. Avoid excess alcohol.  Dispo: Follow-up in 1 year, or sooner as needed.  I,Mathew Stumpf,acting as a Neurosurgeon for [employer], MD.,have documented all relevant documentation on the behalf of Jodelle Red, MD,as directed by  Jodelle Red, MD while in the presence of Jodelle Red, MD.  I, Jodelle Red, MD, have reviewed all documentation for this visit. The documentation on 03/07/23 for the exam, diagnosis, procedures, and orders are all accurate and complete.   Signed, Jodelle Red, MD

## 2023-01-15 NOTE — Patient Instructions (Signed)

## 2023-02-08 ENCOUNTER — Ambulatory Visit: Admitting: Psychology

## 2023-02-24 ENCOUNTER — Ambulatory Visit: Admitting: Psychology

## 2023-02-24 DIAGNOSIS — F4323 Adjustment disorder with mixed anxiety and depressed mood: Secondary | ICD-10-CM | POA: Diagnosis not present
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
## 2023-03-29 ENCOUNTER — Ambulatory Visit: Admitting: Psychology

## 2023-03-29 DIAGNOSIS — F4323 Adjustment disorder with mixed anxiety and depressed mood: Secondary | ICD-10-CM | POA: Diagnosis not present

## 2023-03-29 NOTE — Progress Notes (Signed)
 [facility] Counselor/Therapist Progress Note  Patient ID: Samantha Molina, MRN: [id number],    Date: 03/29/2023  Time Spent: 12:00pm - 12:45pm   45 minutes   Treatment Type: Individual Therapy  Reported Symptoms: sadness  Mental Status Exam: Appearance:  Casual     Behavior: Appropriate  Motor: Normal  Speech/Language:  Normal Rate  Affect: Appropriate  Mood: normal  Thought process: normal  Thought content:   WNL  Sensory/Perceptual disturbances:   WNL  Orientation: oriented to person, place, time/date, and situation  Attention: Good  Concentration: Good  Memory: WNL  Fund of knowledge:  Good  Insight:   Good  Judgment:  Good  Impulse Control: Good   Risk Assessment: Danger to Self:  No Self-injurious Behavior: No Danger to Others: No Duty to Warn:no Physical Aggression / Violence:No  Access to Firearms a concern: No  Gang Involvement:No   Subjective: Pt present for face-to-face individual therapy via video.  Pt consents to telehealth video session and is aware of limitations and benefits of virtual sessions.  Location of pt: home Location of therapist: home office.   Pt talked about the holidays being depressing bc of all the losses she has experienced.  Pt's 63 yo 1st cousin died Christmas day.   Pt attended the funeral yesterday.  Helped pt process her feelings and grief. Pt talked about her sons. Her youngest son was denied disability.  This was very upsetting for the family.   Pt helps her son out financially.   Pt was tearful as she talked about her oldest son.  He fell down the stairs and broke his ankle in two places.   He will need surgery.   Addressed pt's worries about her sons.   Worked on worry management.  Pt is trying to cope by relying on her faith.   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 Worked on self care strategies.   Pt is exercising and going to dance class.    Encouraged her to journal her feelings. Provided supportive therapy.    Interventions: Cognitive Behavioral Therapy and Insight-Oriented  Diagnosis:  F43.23   Plan of Care: Recommend ongoing therapy.   Pt participated in setting treatment goals.   Pt was a place for support and to improve coping skills.   Plan to meet every two weeks.   Pt agrees with treatment plan.  Treatment Plan Client Abilities/Strengths  Pt is bright, engaging, and motivated for therapy.  Client Treatment Preferences  Individual therapy.  Client Statement of Needs  Improve copings skills. Symptoms  Depressed or irritable mood. Excessive and/or unrealistic worry that is difficult to control occurring more days than not for at least 6 months about a number of events or activities. Hypervigilance (e.g., feeling constantly on edge, experiencing concentration difficulties, having trouble falling or staying asleep, exhibiting a general state of irritability). Problems Addressed  Unipolar Depression, Anxiety Goals 1. Alleviate depressive symptoms and return to previous level of effective functioning. 2. Appropriately grieve the loss in order to normalize mood and to return to previously adaptive level of functioning. Objective Learn and implement behavioral strategies to overcome depression. Target Date: 2024-01-11 Frequency: Biweekly  Progress: 10 Modality: individual  Related Interventions Assist the client in developing skills that increase the likelihood of deriving pleasure from behavioral activation (e.g., assertiveness skills, developing an exercise plan, less internal/more external focus, increased social involvement); reinforce success. Engage the client  in behavioral activation, increasing his/her activity level and contact with sources of reward, while identifying processes that inhibit activation. use behavioral techniques such as instruction, rehearsal, role-playing, role reversal, as needed, to  facilitate activity in the client's daily life; reinforce success. 3. Develop healthy interpersonal relationships that lead to the alleviation and help prevent the relapse of depression. 4. Develop healthy thinking patterns and beliefs about self, others, and the world that lead to the alleviation and help prevent the relapse of depression. 5. Enhance ability to effectively cope with the full variety of life's worries and anxieties. 6. Learn and implement coping skills that result in a reduction of anxiety and worry, and improved daily functioning. Objective Learn and implement problem-solving strategies for realistically addressing worries. Target Date: 2024-01-11 Frequency: Biweekly  Progress: 10 Modality: individual  Related Interventions Assign the client a homework exercise in which he/she problem-solves a current problem (see Mastery of Your Anxiety and Worry: Workbook by Richarda and Jonne or Generalized Anxiety Disorder by Delores Filler, and Jonne); review, reinforce success, and provide corrective feedback toward improvement. Teach the client problem-solving strategies involving specifically defining a problem, generating options for addressing it, evaluating the pros and cons of each option, selecting and implementing an optional action, and reevaluating and refining the action. Objective Learn and implement calming skills to reduce overall anxiety and manage anxiety symptoms. Target Date: 2024-01-11 Frequency: Biweekly  Progress: 10 Modality: individual  Related Interventions Assign the client to read about progressive muscle relaxation and other calming strategies in relevant books or treatment manuals (e.g., Progressive Relaxation Training by Thornell armin Collier; Mastery of Your Anxiety and Worry: Workbook by Richarda armin Jonne). Assign the client homework each session in which he/she practices relaxation exercises daily, gradually applying them progressively from  non-anxiety-provoking to anxiety-provoking situations; review and reinforce success while providing corrective feedback toward improvement. Teach the client calming/relaxation skills (e.g., applied relaxation, progressive muscle relaxation, cue controlled relaxation; mindful breathing; biofeedback) and how to discriminate better between relaxation and tension; teach the client how to apply these skills to his/her daily life. 7. Recognize, accept, and cope with feelings of depression. 8. Reduce overall frequency, intensity, and duration of the anxiety so that daily functioning is not impaired. 9. Resolve the core conflict that is the source of anxiety. 10. Stabilize anxiety level while increasing ability to function on a daily basis. Diagnosis F43.23  Conditions For Discharge Achievement of treatment goals and objectives   Samantha Alma, LCSW

## 2023-04-13 ENCOUNTER — Other Ambulatory Visit (HOSPITAL_BASED_OUTPATIENT_CLINIC_OR_DEPARTMENT_OTHER): Payer: Self-pay | Admitting: Physician Assistant

## 2023-04-13 DIAGNOSIS — M545 Low back pain, unspecified: Secondary | ICD-10-CM

## 2023-04-20 ENCOUNTER — Ambulatory Visit (HOSPITAL_COMMUNITY)
Admission: RE | Admit: 2023-04-20 | Discharge: 2023-04-20 | Disposition: A | Discharge status: Home / Self Care | Source: Ambulatory Visit | Attending: Physician Assistant | Admitting: Physician Assistant

## 2023-04-20 ENCOUNTER — Other Ambulatory Visit (HOSPITAL_BASED_OUTPATIENT_CLINIC_OR_DEPARTMENT_OTHER): Payer: Self-pay | Admitting: Physician Assistant

## 2023-04-20 DIAGNOSIS — M545 Low back pain, unspecified: Secondary | ICD-10-CM

## 2023-04-21 ENCOUNTER — Other Ambulatory Visit: Payer: Self-pay

## 2023-04-21 ENCOUNTER — Emergency Department (HOSPITAL_BASED_OUTPATIENT_CLINIC_OR_DEPARTMENT_OTHER): Admitting: Radiology

## 2023-04-21 ENCOUNTER — Emergency Department (HOSPITAL_BASED_OUTPATIENT_CLINIC_OR_DEPARTMENT_OTHER)
Admission: EM | Admit: 2023-04-21 | Discharge: 2023-04-21 | Disposition: A | Discharge status: Home / Self Care | Attending: Emergency Medicine | Admitting: Emergency Medicine

## 2023-04-21 ENCOUNTER — Encounter (HOSPITAL_BASED_OUTPATIENT_CLINIC_OR_DEPARTMENT_OTHER): Payer: Self-pay | Admitting: Emergency Medicine

## 2023-04-21 DIAGNOSIS — E119 Type 2 diabetes mellitus without complications: Secondary | ICD-10-CM | POA: Insufficient documentation

## 2023-04-21 DIAGNOSIS — Y92410 Unspecified street and highway as the place of occurrence of the external cause: Secondary | ICD-10-CM | POA: Diagnosis not present

## 2023-04-21 DIAGNOSIS — M549 Dorsalgia, unspecified: Secondary | ICD-10-CM | POA: Diagnosis not present

## 2023-04-21 DIAGNOSIS — Z7982 Long term (current) use of aspirin: Secondary | ICD-10-CM | POA: Diagnosis not present

## 2023-04-21 DIAGNOSIS — Z79899 Other long term (current) drug therapy: Secondary | ICD-10-CM | POA: Insufficient documentation

## 2023-04-21 DIAGNOSIS — I2510 Atherosclerotic heart disease of native coronary artery without angina pectoris: Secondary | ICD-10-CM | POA: Insufficient documentation

## 2023-04-21 DIAGNOSIS — I10 Essential (primary) hypertension: Secondary | ICD-10-CM | POA: Insufficient documentation

## 2023-04-21 DIAGNOSIS — M542 Cervicalgia: Secondary | ICD-10-CM | POA: Diagnosis not present

## 2023-04-21 DIAGNOSIS — R0781 Pleurodynia: Secondary | ICD-10-CM | POA: Diagnosis present

## 2023-04-21 MED ORDER — KETOROLAC TROMETHAMINE 60 MG/2ML IM SOLN
30.0000 mg | Freq: Once | INTRAMUSCULAR | Status: AC
  Administered 2023-04-21: 30 mg via INTRAMUSCULAR
  Filled 2023-04-21: qty 2

## 2023-04-21 NOTE — ED Notes (Signed)
Discharge paperwork given and verbally understood.

## 2023-04-21 NOTE — ED Notes (Signed)
Pt. returned from XR.
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
## 2023-04-21 NOTE — ED Notes (Signed)
Patient transported to X-ray

## 2023-04-21 NOTE — Discharge Instructions (Addendum)
Your XR imaging was negative: IMPRESSION:  Negative. Should the patient's symptoms persist or worsen, repeat  radiographs of the ribs in 10 - 14 days maybe of use to detect  subtle nondisplaced rib fractures (which are commonly occult on  initial imaging).   Please follow-up with your PCP to ensure resolution. Take Tylenol and Motrin for pain control.

## 2023-04-28 ENCOUNTER — Ambulatory Visit: Admitting: Psychology

## 2023-04-28 DIAGNOSIS — F4323 Adjustment disorder with mixed anxiety and depressed mood: Secondary | ICD-10-CM | POA: Diagnosis not present
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
## 2023-05-28 ENCOUNTER — Ambulatory Visit: Admitting: Psychology

## 2023-05-28 DIAGNOSIS — F4323 Adjustment disorder with mixed anxiety and depressed mood: Secondary | ICD-10-CM | POA: Diagnosis not present

## 2023-05-28 NOTE — Progress Notes (Signed)
 [facility] Counselor/Therapist Progress Note  Patient ID: Samantha Molina, MRN: [id number],    Date: 05/28/2023  Time Spent: 12:00pm-12:45pm   45 minutes   Treatment Type: Individual Therapy  Reported Symptoms: stress  Mental Status Exam: Appearance:  Casual     Behavior: Appropriate  Motor: Normal  Speech/Language:  Normal Rate  Affect: Appropriate  Mood: normal  Thought process: normal  Thought content:   WNL  Sensory/Perceptual disturbances:   WNL  Orientation: oriented to person, place, time/date, and situation  Attention: Good  Concentration: Good  Memory: WNL  Fund of knowledge:  Good  Insight:   Good  Judgment:  Good  Impulse Control: Good   Risk Assessment: Danger to Self:  No Self-injurious Behavior: No Danger to Others: No Duty to Warn:no Physical Aggression / Violence:No  Access to Firearms a concern: No  Gang Involvement:No   Subjective: Pt present for face-to-face individual therapy via video.  Pt consents to telehealth video session and is aware of limitations and benefits of virtual sessions.  Location of pt: home Location of therapist: home office.   Pt talked about experiencing a lot of grief the past few weeks.   Pt's dog died.  Pt's pets are a part of her family.    Also pt's "sister friend" Elana Alm of 25 years died on 05-20-23.  Pt was very close to her and is grieving intensely.   Pt attended the funeral.   She is reaching out to friends who were also close to [place]. When pt first found out about Nan's death pt could not get out of bed and was isolating.  The past week she has been nudging herself to re engage in her life and has gotten together with friends and gone to her dance class.  Addressed how special Elana Alm was to pt and how she can memorialize her.  Pt plans to make a photo book of their times together. Helped pt process her feelings and grief.  Pt is trying to cope by relying on her faith.  She also plans to restart exercising.    Worked on self care strategies.   Encouraged her to journal her feelings. Provided supportive therapy.    Interventions: Cognitive Behavioral Therapy and Insight-Oriented  Diagnosis:  F43.23   Plan of Care: Recommend ongoing therapy.   Pt participated in setting treatment goals.   Pt was a place for support and to improve coping skills.   Plan to meet every two weeks.   Pt agrees with treatment plan.  Treatment Plan Client Abilities/Strengths  Pt is bright, engaging, and motivated for therapy.  Client Treatment Preferences  Individual therapy.  Client Statement of Needs  Improve copings skills. Symptoms  Depressed or irritable mood. Excessive and/or unrealistic worry that is difficult to control occurring more days than not for at least 6 months about a number of events or activities. Hypervigilance (e.g., feeling constantly on edge, experiencing concentration difficulties, having trouble falling or staying asleep, exhibiting a general state of irritability). Problems Addressed  Unipolar Depression, Anxiety Goals 1. Alleviate depressive symptoms and return to previous level of effective functioning. 2. Appropriately grieve the loss in order to normalize mood and to return to previously adaptive level of functioning. Objective Learn and implement behavioral strategies to overcome depression. Target Date: 2024-01-11 Frequency: Biweekly  Progress: 10 Modality: individual  Related Interventions Assist the client in developing skills that increase the likelihood of deriving pleasure from behavioral activation (e.g., assertiveness skills, developing an exercise plan,  less internal/more external focus, increased social involvement); reinforce success. Engage the client in "behavioral activation," increasing his/her activity level and contact with sources of reward, while identifying processes that inhibit activation. use behavioral techniques such as instruction, rehearsal, role-playing,  role reversal, as needed, to facilitate activity in the client's daily life; reinforce success. 3. Develop healthy interpersonal relationships that lead to the alleviation and help prevent the relapse of depression. 4. Develop healthy thinking patterns and beliefs about self, others, and the world that lead to the alleviation and help prevent the relapse of depression. 5. Enhance ability to effectively cope with the full variety of life's worries and anxieties. 6. Learn and implement coping skills that result in a reduction of anxiety and worry, and improved daily functioning. Objective Learn and implement problem-solving strategies for realistically addressing worries. Target Date: 2024-01-11 Frequency: Biweekly  Progress: 10 Modality: individual  Related Interventions Assign the client a homework exercise in which he/she problem-solves a current problem (see Mastery of Your Anxiety and Worry: Workbook by Elenora Fender and Filbert Schilder or Generalized Anxiety Disorder by Elesa Hacker, and Filbert Schilder); review, reinforce success, and provide corrective feedback toward improvement. Teach the client problem-solving strategies involving specifically defining a problem, generating options for addressing it, evaluating the pros and cons of each option, selecting and implementing an optional action, and reevaluating and refining the action. Objective Learn and implement calming skills to reduce overall anxiety and manage anxiety symptoms. Target Date: 2024-01-11 Frequency: Biweekly  Progress: 10 Modality: individual  Related Interventions Assign the client to read about progressive muscle relaxation and other calming strategies in relevant books or treatment manuals (e.g., Progressive Relaxation Training by Twana First; Mastery of Your Anxiety and Worry: Workbook by Earlie Counts). Assign the client homework each session in which he/she practices relaxation exercises daily, gradually applying them  progressively from non-anxiety-provoking to anxiety-provoking situations; review and reinforce success while providing corrective feedback toward improvement. Teach the client calming/relaxation skills (e.g., applied relaxation, progressive muscle relaxation, cue controlled relaxation; mindful breathing; biofeedback) and how to discriminate better between relaxation and tension; teach the client how to apply these skills to his/her daily life. 7. Recognize, accept, and cope with feelings of depression. 8. Reduce overall frequency, intensity, and duration of the anxiety so that daily functioning is not impaired. 9. Resolve the core conflict that is the source of anxiety. 10. Stabilize anxiety level while increasing ability to function on a daily basis. Diagnosis F43.23  Conditions For Discharge Achievement of treatment goals and objectives   Salomon Fick, LCSW

## 2023-06-25 ENCOUNTER — Ambulatory Visit: Admitting: Psychology

## 2023-07-23 ENCOUNTER — Ambulatory Visit (INDEPENDENT_AMBULATORY_CARE_PROVIDER_SITE_OTHER): Admitting: Psychology

## 2023-07-23 DIAGNOSIS — F4323 Adjustment disorder with mixed anxiety and depressed mood: Secondary | ICD-10-CM | POA: Diagnosis not present

## 2023-07-23 NOTE — Progress Notes (Signed)
 [facility] Counselor/Therapist Progress Note  Patient ID: Samantha Molina, MRN: [id number],    Date: 07/23/2023  Time Spent: 12:00pm-12:45pm   45 minutes   Treatment Type: Individual Therapy  Reported Symptoms: stress  Mental Status Exam: Appearance:  Casual     Behavior: Appropriate  Motor: Normal  Speech/Language:  Normal Rate  Affect: Appropriate  Mood: normal  Thought process: normal  Thought content:   WNL  Sensory/Perceptual disturbances:   WNL  Orientation: oriented to person, place, time/date, and situation  Attention: Good  Concentration: Good  Memory: WNL  Fund of knowledge:  Good  Insight:   Good  Judgment:  Good  Impulse Control: Good   Risk Assessment: Danger to Self:  No Self-injurious Behavior: No Danger to Others: No Duty to Warn:no Physical Aggression / Violence:No  Access to Firearms a concern: No  Gang Involvement:No   Subjective: Pt present for face-to-face individual therapy via video.  Pt consents to telehealth video session and is aware of limitations and benefits of virtual sessions.  Location of pt: home Location of therapist: home office.   Pt talked about continuing to grieve for the loss of Nan.  Helped pt process her feelings and grief.  Pt is trying to cope by relying on her faith.   Pt talked about feeling like she has too much on her regarding helping family members.  Pt's brother who stays with their mother almost lost his leg bc of an infection.   Pt has been helping to care for him and do wound care.   Addressed pt's stress and how she can take some time for herself.  Worked on self care strategies.   Pt talked about her relationship with her husband.   She is very upset that he is not planning to attend her college graduation on May 15th.   This has hurt pt's feelings.  Addressed how pt can communicate her feelings to her husband.  Helped pt process her feelings and relationship dynamics.   Encouraged her to journal  her feelings. Provided supportive therapy.    Interventions: Cognitive Behavioral Therapy and Insight-Oriented  Diagnosis:  F43.23   Plan of Care: Recommend ongoing therapy.   Pt participated in setting treatment goals.   Pt was a place for support and to improve coping skills.   Plan to meet every two weeks.   Pt agrees with treatment plan.  Treatment Plan Client Abilities/Strengths  Pt is bright, engaging, and motivated for therapy.  Client Treatment Preferences  Individual therapy.  Client Statement of Needs  Improve copings skills. Symptoms  Depressed or irritable mood. Excessive and/or unrealistic worry that is difficult to control occurring more days than not for at least 6 months about a number of events or activities. Hypervigilance (e.g., feeling constantly on edge, experiencing concentration difficulties, having trouble falling or staying asleep, exhibiting a general state of irritability). Problems Addressed  Unipolar Depression, Anxiety Goals 1. Alleviate depressive symptoms and return to previous level of effective functioning. 2. Appropriately grieve the loss in order to normalize mood and to return to previously adaptive level of functioning. Objective Learn and implement behavioral strategies to overcome depression. Target Date: 2024-01-11 Frequency: Biweekly  Progress: 10 Modality: individual  Related Interventions Assist the client in developing skills that increase the likelihood of deriving pleasure from behavioral activation (e.g., assertiveness skills, developing an exercise plan, less internal/more external focus, increased social involvement); reinforce success. Engage the client in "behavioral activation," increasing his/her activity level and contact with  sources of reward, while identifying processes that inhibit activation. use behavioral techniques such as instruction, rehearsal, role-playing, role reversal, as needed, to facilitate activity in the client's  daily life; reinforce success. 3. Develop healthy interpersonal relationships that lead to the alleviation and help prevent the relapse of depression. 4. Develop healthy thinking patterns and beliefs about self, others, and the world that lead to the alleviation and help prevent the relapse of depression. 5. Enhance ability to effectively cope with the full variety of life's worries and anxieties. 6. Learn and implement coping skills that result in a reduction of anxiety and worry, and improved daily functioning. Objective Learn and implement problem-solving strategies for realistically addressing worries. Target Date: 2024-01-11 Frequency: Biweekly  Progress: 10 Modality: individual  Related Interventions Assign the client a homework exercise in which he/she problem-solves a current problem (see Mastery of Your Anxiety and Worry: Workbook by Colbert Dates and Edna Gouty or Generalized Anxiety Disorder by Woodson He, and Edna Gouty); review, reinforce success, and provide corrective feedback toward improvement. Teach the client problem-solving strategies involving specifically defining a problem, generating options for addressing it, evaluating the pros and cons of each option, selecting and implementing an optional action, and reevaluating and refining the action. Objective Learn and implement calming skills to reduce overall anxiety and manage anxiety symptoms. Target Date: 2024-01-11 Frequency: Biweekly  Progress: 10 Modality: individual  Related Interventions Assign the client to read about progressive muscle relaxation and other calming strategies in relevant books or treatment manuals (e.g., Progressive Relaxation Training by Juleen Oakland; Mastery of Your Anxiety and Worry: Workbook by Rodney Clamp). Assign the client homework each session in which he/she practices relaxation exercises daily, gradually applying them progressively from non-anxiety-provoking to anxiety-provoking situations;  review and reinforce success while providing corrective feedback toward improvement. Teach the client calming/relaxation skills (e.g., applied relaxation, progressive muscle relaxation, cue controlled relaxation; mindful breathing; biofeedback) and how to discriminate better between relaxation and tension; teach the client how to apply these skills to his/her daily life. 7. Recognize, accept, and cope with feelings of depression. 8. Reduce overall frequency, intensity, and duration of the anxiety so that daily functioning is not impaired. 9. Resolve the core conflict that is the source of anxiety. 10. Stabilize anxiety level while increasing ability to function on a daily basis. Diagnosis F43.23  Conditions For Discharge Achievement of treatment goals and objectives   Willey Harrier, LCSW

## 2023-08-18 ENCOUNTER — Ambulatory Visit
Admission: RE | Admit: 2023-08-18 | Discharge: 2023-08-18 | Disposition: A | Discharge status: Home / Self Care | Source: Ambulatory Visit | Attending: Family Medicine | Admitting: Family Medicine

## 2023-08-18 DIAGNOSIS — Z13820 Encounter for screening for osteoporosis: Secondary | ICD-10-CM

## 2023-08-18 DIAGNOSIS — Z1231 Encounter for screening mammogram for malignant neoplasm of breast: Secondary | ICD-10-CM

## 2023-08-20 ENCOUNTER — Other Ambulatory Visit: Payer: Self-pay | Admitting: Family Medicine

## 2023-08-20 ENCOUNTER — Ambulatory Visit: Admitting: Psychology

## 2023-08-20 DIAGNOSIS — M7989 Other specified soft tissue disorders: Secondary | ICD-10-CM

## 2023-08-23 ENCOUNTER — Ambulatory Visit
Admission: RE | Admit: 2023-08-23 | Discharge: 2023-08-23 | Disposition: A | Discharge status: Home / Self Care | Source: Ambulatory Visit | Attending: Family Medicine | Admitting: Family Medicine

## 2023-08-23 DIAGNOSIS — M7989 Other specified soft tissue disorders: Secondary | ICD-10-CM

## 2023-09-20 ENCOUNTER — Ambulatory Visit (INDEPENDENT_AMBULATORY_CARE_PROVIDER_SITE_OTHER): Admitting: Psychology

## 2023-09-20 DIAGNOSIS — F4323 Adjustment disorder with mixed anxiety and depressed mood: Secondary | ICD-10-CM
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
## 2023-11-18 ENCOUNTER — Ambulatory Visit: Admitting: Psychology

## 2023-12-16 ENCOUNTER — Ambulatory Visit: Admitting: Psychology
